# Patient Record
Sex: Male | Born: 1960 | Race: White | Hispanic: No | Marital: Married | Smoking: Never smoker
Health system: Southern US, Community
[De-identification: ages and names within clinical notes are randomized; demographics above are authoritative.]

## PROBLEM LIST (undated history)

## (undated) DIAGNOSIS — K219 Gastro-esophageal reflux disease without esophagitis: Secondary | ICD-10-CM

## (undated) DIAGNOSIS — E785 Hyperlipidemia, unspecified: Secondary | ICD-10-CM

## (undated) DIAGNOSIS — F329 Major depressive disorder, single episode, unspecified: Secondary | ICD-10-CM

## (undated) DIAGNOSIS — I10 Essential (primary) hypertension: Secondary | ICD-10-CM

## (undated) DIAGNOSIS — F32A Depression, unspecified: Secondary | ICD-10-CM

## (undated) DIAGNOSIS — M1990 Unspecified osteoarthritis, unspecified site: Secondary | ICD-10-CM

## (undated) HISTORY — PX: HERNIA REPAIR: SHX51

## (undated) HISTORY — PX: VASECTOMY: SHX75

## (undated) HISTORY — DX: Hyperlipidemia, unspecified: E78.5

## (undated) HISTORY — DX: Essential (primary) hypertension: I10

---
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
## 2013-01-27 ENCOUNTER — Encounter: Payer: Self-pay | Admitting: Emergency Medicine

## 2013-03-10 ENCOUNTER — Encounter: Payer: Self-pay | Admitting: Emergency Medicine

## 2013-03-10 DIAGNOSIS — E785 Hyperlipidemia, unspecified: Secondary | ICD-10-CM | POA: Insufficient documentation

## 2013-03-10 DIAGNOSIS — E559 Vitamin D deficiency, unspecified: Secondary | ICD-10-CM | POA: Insufficient documentation

## 2013-03-12 ENCOUNTER — Ambulatory Visit (INDEPENDENT_AMBULATORY_CARE_PROVIDER_SITE_OTHER): Payer: Commercial Managed Care - PPO | Admitting: Emergency Medicine

## 2013-03-12 ENCOUNTER — Encounter: Payer: Self-pay | Admitting: Emergency Medicine

## 2013-03-12 VITALS — BP 124/80 | HR 90 | Temp 98.0°F | Resp 18 | Ht 69.5 in | Wt 165.0 lb

## 2013-03-12 DIAGNOSIS — I10 Essential (primary) hypertension: Secondary | ICD-10-CM | POA: Insufficient documentation

## 2013-03-12 DIAGNOSIS — J329 Chronic sinusitis, unspecified: Secondary | ICD-10-CM

## 2013-03-12 DIAGNOSIS — E782 Mixed hyperlipidemia: Secondary | ICD-10-CM

## 2013-03-12 DIAGNOSIS — J309 Allergic rhinitis, unspecified: Secondary | ICD-10-CM

## 2013-03-12 DIAGNOSIS — E559 Vitamin D deficiency, unspecified: Secondary | ICD-10-CM

## 2013-03-12 DIAGNOSIS — M25569 Pain in unspecified knee: Secondary | ICD-10-CM

## 2013-03-12 LAB — CBC WITH DIFFERENTIAL/PLATELET
Basophils Absolute: 0 10*3/uL (ref 0.0–0.1)
Basophils Relative: 0 % (ref 0–1)
EOS ABS: 0.2 10*3/uL (ref 0.0–0.7)
EOS PCT: 3 % (ref 0–5)
HEMATOCRIT: 43.4 % (ref 39.0–52.0)
Hemoglobin: 14.9 g/dL (ref 13.0–17.0)
LYMPHS PCT: 25 % (ref 12–46)
Lymphs Abs: 1.9 10*3/uL (ref 0.7–4.0)
MCH: 32.5 pg (ref 26.0–34.0)
MCHC: 34.3 g/dL (ref 30.0–36.0)
MCV: 94.6 fL (ref 78.0–100.0)
MONO ABS: 0.7 10*3/uL (ref 0.1–1.0)
Monocytes Relative: 9 % (ref 3–12)
Neutro Abs: 4.9 10*3/uL (ref 1.7–7.7)
Neutrophils Relative %: 63 % (ref 43–77)
PLATELETS: 277 10*3/uL (ref 150–400)
RBC: 4.59 MIL/uL (ref 4.22–5.81)
RDW: 13.2 % (ref 11.5–15.5)
WBC: 7.6 10*3/uL (ref 4.0–10.5)

## 2013-03-12 MED ORDER — AZITHROMYCIN 250 MG PO TABS: ORAL_TABLET | ORAL | Status: AC

## 2013-03-12 MED ORDER — PREDNISONE 10 MG PO TABS: ORAL_TABLET | ORAL | Status: DC

## 2013-03-12 NOTE — Patient Instructions (Signed)
Total Knee Replacement Total knee replacement is a procedure to replace your knee joint with an artificial knee joint (prosthetic knee joint). The purpose of this surgery is to reduce pain and improve your knee function. LET YOUR CAREGIVER KNOW ABOUT:   Any allergies you have.  Any medicines you are taking, including vitamins, herbs, eyedrops, over-the-counter medicines, and creams.  Any problems you have had with the use of anesthetics.  Family history of problems with the use of anesthetics.  Any blood disorders you have, including bleeding problems or clotting problems.  Previous surgeries you have had. RISKS AND COMPLICATIONS  Generally, total knee replacement is a safe procedure. However, as with any surgical procedure, complications can occur. Possible complications associated with total knee replacement include:  Loss of range of motion of the knee or instability.  Loosening of the prosthesis.  Infection.  Persistent pain. BEFORE THE PROCEDURE   Your caregiver will instruct you when you need to stop eating and drinking.  Ask your caregiver if you need to change or stop any regular medicines. PROCEDURE  Just before the procedure you will receive medicine that will make you drowsy (sedative). This will be given through a tube that is inserted into one of your veins (intravenous [IV] tube). Then you will either receive medicine to block pain from the waist down through your legs (spinal block) or medicine to also receive medicine to make you fall asleep (general anesthetic). You may also receive medicine to block feeling in your leg (nerve block) to help ease pain after surgery. An incision will be made in your knee. Your surgeon will take out any damaged cartilage and bone by sawing off the damaged surfaces. Then the surgeon will put a new metal liner over the sawed off portion of your thigh bone (femur) and a plastic liner over the sawed off portion of one of the bones of your  lower leg (tibia). This is to restore alignment and function to your knee. A plastic piece is often used to restore the surface of your knee cap. AFTER THE PROCEDURE  You will be taken to the recovery area. You may have drainage tubes to drain excess fluid from your knee. These tubes attach to a device that removes these fluids. Once you are awake, stable, and taking fluids well, you will be taken to your hospital room. You will receive physical therapy as prescribed by your caregiver. The length of your stay in the hospital after a knee replacement is 2 4 days. Your surgeon may recommend that you spend time (usually an additional 10 14 days) in an extended-care facility to help you begin walking again and improve your range of motion before you go home. You may also be prescribed blood-thinning medicine to decrease your risk of developing blood clots in your leg. Document Released: 05/15/2000 Document Revised: 08/08/2011 Document Reviewed: 03/19/2011 [facility] Patient Information 2014 [place], [place]. Knee Effusion  Knee effusion means you have fluid in your knee. The knee may be more difficult to bend and move. HOME CARE  Use crutches or a brace as told by your doctor.  Put ice on the injured area.  Put ice in a plastic bag.  Place a towel between your skin and the bag.  Leave the ice on for 15-20 minutes, 03-04 times a day.  Raise (elevate) your knee as much as possible.  Only take medicine as told by your doctor.  You may need to do strengthening exercises. Ask your doctor.  Continue with  your normal diet and activities as told by your doctor. GET HELP RIGHT AWAY IF:  You have more puffiness (swelling) in your knee.  You see redness, puffiness, or have more pain in your knee.  You have a temperature by mouth above 102 F (38.9 C).  You get a rash.  You have trouble breathing.  You have a reaction to any medicine you are taking.  You have a lot of pain when you move your  knee. MAKE SURE YOU:  Understand these instructions.  Will watch your condition.  Will get help right away if you are not doing well or get worse. Document Released: 03/11/2010 Document Revised: 05/01/2011 Document Reviewed: 03/11/2010 [facility] Patient Information 2014 [place], [place]. Arthroscopic Procedure, Knee An arthroscopic procedure can find what is wrong with your knee. PROCEDURE Arthroscopy is a surgical technique that allows your orthopedic surgeon to diagnose and treat your knee injury with accuracy. They will look into your knee through a small instrument. This is almost like a small (pencil sized) telescope. Because arthroscopy affects your knee less than open knee surgery, you can anticipate a more rapid recovery. Taking an active role by following your caregiver's instructions will help with rapid and complete recovery. Use crutches, rest, elevation, ice, and knee exercises as instructed. The length of recovery depends on various factors including type of injury, age, physical condition, medical conditions, and your rehabilitation. Your knee is the joint between the large bones (femur and tibia) in your leg. Cartilage covers these bone ends which are smooth and slippery and allow your knee to bend and move smoothly. Two menisci, thick, semi-lunar shaped pads of cartilage which form a rim inside the joint, help absorb shock and stabilize your knee. Ligaments bind the bones together and support your knee joint. Muscles move the joint, help support your knee, and take stress off the joint itself. Because of this all programs and physical therapy to rehabilitate an injured or repaired knee require rebuilding and strengthening your muscles. AFTER THE PROCEDURE  After the procedure, you will be moved to a recovery area until most of the effects of the medication have worn off. Your caregiver will discuss the test results with you.  Only take over-the-counter or prescription medicines for  pain, discomfort, or fever as directed by your caregiver. SEEK MEDICAL CARE IF:   You have increased bleeding from your wounds.  You see redness, swelling, or have increasing pain in your wounds.  You have pus coming from your wound.  You have an oral temperature above 102 F (38.9 C).  You notice a bad smell coming from the wound or dressing.  You have severe pain with any motion of your knee. SEEK IMMEDIATE MEDICAL CARE IF:   You develop a rash.  You have difficulty breathing.  You have any allergic problems. Document Released: 02/04/2000 Document Revised: 05/01/2011 Document Reviewed: 08/28/2007 [facility] Patient Information 2014 [place], [place].

## 2013-03-12 NOTE — Progress Notes (Signed)
Subjective:    Patient ID: Jermaine Stewart, male    DOB: 01-29-1961, 52 y.o.   MRN: [id number]  HPI Comments: 53 yo pleasant male presents for 3 month F/U for HTN, Cholesterol, D. Deficient. He added vitamin D at his last visit and needs rechecked. He is trying to eat a little better and has decreased ETOH. He notes today he did have fried chicken for lunch unfortunately. He is not exercising as much with increased left knee pain. He notes he used to run QD until pain and swelling increased. He has had fluid drawn off several times, he has MRI showing arthritis build up, he is considering knee surgery and has f/u with Ortho in 2 months. He did have rooster comb injection without any relief and has been taking Meloxicam routinely without relief. LAST LABS TOTAL 281 DOWN TO 204 TRIG 873 D 25   3 WEEKS OF INCREASING DRAINAGE FROM SINUS AND NOW COLOR PRODUCTION WITH CHEST X 1 WEEK. He has tried OTC Mucinex and increased H2o without relief. He notes he does not feel bad otherwise.   Hyperlipidemia   Current Outpatient Prescriptions on File Prior to Visit  Medication Sig Dispense Refill  . aspirin 81 MG tablet Take 81 mg by mouth daily.      Marland Kitchen atorvastatin (LIPITOR) 80 MG tablet Take 80 mg by mouth daily. 1/2 mwf      . benazepril-hydrochlorthiazide (LOTENSIN HCT) 20-12.5 MG per tablet Take 1 tablet by mouth daily.      . Cholecalciferol (VITAMIN D-3) 5000 UNITS TABS Take 5,000 tablets by mouth daily. 2 PO QD      . meloxicam (MOBIC) 15 MG tablet Take 15 mg by mouth daily.       No current facility-administered medications on file prior to visit.   Review of patient's allergies indicates no known allergies.   Past Medical History  Diagnosis Date  . Hyperlipidemia   . Unspecified vitamin D deficiency   . Hypertension       Review of Systems  HENT: Positive for congestion and sinus pressure.   Respiratory: Positive for cough.   Musculoskeletal: Positive for arthralgias and joint  swelling.  All other systems reviewed and are negative.   BP 124/80  Pulse 90  Temp(Src) 98 F (36.7 C) (Temporal)  Resp 18  Ht 5' 9.5" (1.765 m)  Wt 165 lb (74.844 kg)  BMI 24.03 kg/m2     Objective:   Physical Exam  Nursing note and vitals reviewed. Constitutional: He is oriented to person, place, and time. He appears well-developed and well-nourished.  HENT:  Head: Normocephalic and atraumatic.  Right Ear: External ear normal.  Left Ear: External ear normal.  Nose: Nose normal.  Mouth/Throat: Oropharynx is clear and moist. No oropharyngeal exudate.  Yellow TMs bilateral Mild maxillary tenderness  Eyes: Conjunctivae and EOM are normal.  Neck: Normal range of motion. Neck supple. No JVD present. No thyromegaly present.  Cardiovascular: Normal rate, regular rhythm, normal heart sounds and intact distal pulses.   Pulmonary/Chest: Effort normal and breath sounds normal.  Congested cough  Abdominal: Soft. Bowel sounds are normal. He exhibits no distension and no mass. There is no tenderness. There is no rebound and no guarding.  Musculoskeletal: Normal range of motion. He exhibits edema. He exhibits no tenderness.  Left knee edema, + crepitus Bilateral  Lymphadenopathy:    He has no cervical adenopathy.  Neurological: He is alert and oriented to person, place, and time. He has normal reflexes.  No cranial nerve deficit. Coordination normal.  Skin: Skin is warm and dry.  Psychiatric: He has a normal mood and affect. His behavior is normal. Judgment and thought content normal.          Assessment & Plan:  1.  3 month F/U for HTN, Cholesterol, D. Deficient. Needs healthy diet, cardio QD and obtain healthy weight. Check Labs, Check BP if >130/80 call office 2. Knee pain- Advised to see ortho ASAP to consider surgery switch Meloxicam to Zorvolex 18 mg #3 sx given and 35mg  SX #9 try either dose 1 BID-TID call with results. Do Not start until finishes PRED 3. Sinusitis/ Allergic  rhinitis- Allegra OTC, increase H2o, allergy hygiene explained. Zpak/ Pred DP 10 MG AD

## 2013-03-13 ENCOUNTER — Encounter: Payer: Self-pay | Admitting: Emergency Medicine

## 2013-03-13 LAB — BASIC METABOLIC PANEL WITH GFR
BUN: 16 mg/dL (ref 6–23)
CALCIUM: 10.2 mg/dL (ref 8.4–10.5)
CHLORIDE: 93 meq/L — AB (ref 96–112)
CO2: 29 meq/L (ref 19–32)
CREATININE: 0.93 mg/dL (ref 0.50–1.35)
GFR, Est African American: >89 mL/min
GFR, Est Non African American: >89 mL/min
GLUCOSE: 89 mg/dL (ref 70–99)
Potassium: 4.2 mEq/L (ref 3.5–5.3)
Sodium: 135 mEq/L (ref 135–145)

## 2013-03-13 LAB — HEPATIC FUNCTION PANEL
ALT: 46 U/L (ref 0–53)
AST: 35 U/L (ref 0–37)
Albumin: 4.9 g/dL (ref 3.5–5.2)
Alkaline Phosphatase: 60 U/L (ref 39–117)
BILIRUBIN INDIRECT: 0.4 mg/dL (ref 0.0–0.9)
Bilirubin, Direct: 0.1 mg/dL (ref 0.0–0.3)
TOTAL PROTEIN: 7.3 g/dL (ref 6.0–8.3)
Total Bilirubin: 0.5 mg/dL (ref 0.3–1.2)

## 2013-03-13 LAB — LIPID PANEL
Cholesterol: 217 mg/dL — ABNORMAL HIGH (ref 0–200)
HDL: 58 mg/dL (ref >39)
TRIGLYCERIDES: 680 mg/dL — AB (ref <150)
Total CHOL/HDL Ratio: 3.7 Ratio

## 2013-03-13 LAB — VITAMIN D 25 HYDROXY (VIT D DEFICIENCY, FRACTURES): Vit D, 25-Hydroxy: 84 ng/mL (ref 30–89)

## 2013-03-13 LAB — MAGNESIUM: Magnesium: 2 mg/dL (ref 1.5–2.5)

## 2013-03-24 ENCOUNTER — Encounter: Payer: Self-pay | Admitting: Emergency Medicine

## 2013-03-25 ENCOUNTER — Other Ambulatory Visit: Payer: Self-pay | Admitting: Emergency Medicine

## 2013-03-25 MED ORDER — DICLOFENAC 35 MG PO CAPS: 35.0000 mg | ORAL_CAPSULE | Freq: Three times a day (TID) | ORAL | Status: DC

## 2013-04-07 ENCOUNTER — Other Ambulatory Visit: Payer: Self-pay | Admitting: Emergency Medicine

## 2013-04-07 MED ORDER — AZITHROMYCIN 250 MG PO TABS
ORAL_TABLET | ORAL | Status: AC
Start: 2013-04-07 — End: 2013-04-12

## 2013-06-08 ENCOUNTER — Other Ambulatory Visit: Payer: Self-pay | Admitting: Emergency Medicine

## 2013-06-08 ENCOUNTER — Encounter: Payer: Self-pay | Admitting: Internal Medicine

## 2013-06-08 MED ORDER — AZELASTINE HCL 0.15 % NA SOLN: NASAL | Status: DC

## 2013-06-16 ENCOUNTER — Encounter: Payer: Self-pay | Admitting: Internal Medicine

## 2013-07-17 ENCOUNTER — Other Ambulatory Visit: Payer: Self-pay | Admitting: Internal Medicine

## 2013-07-22 ENCOUNTER — Ambulatory Visit (INDEPENDENT_AMBULATORY_CARE_PROVIDER_SITE_OTHER): Payer: Commercial Managed Care - PPO | Admitting: Internal Medicine

## 2013-07-22 ENCOUNTER — Encounter: Payer: Self-pay | Admitting: Internal Medicine

## 2013-07-22 VITALS — BP 116/74 | HR 80 | Temp 99.3°F | Resp 16 | Ht 69.5 in | Wt 165.8 lb

## 2013-07-22 DIAGNOSIS — Z113 Encounter for screening for infections with a predominantly sexual mode of transmission: Secondary | ICD-10-CM

## 2013-07-22 DIAGNOSIS — E559 Vitamin D deficiency, unspecified: Secondary | ICD-10-CM

## 2013-07-22 DIAGNOSIS — R7402 Elevation of levels of lactic acid dehydrogenase (LDH): Secondary | ICD-10-CM

## 2013-07-22 DIAGNOSIS — Z111 Encounter for screening for respiratory tuberculosis: Secondary | ICD-10-CM

## 2013-07-22 DIAGNOSIS — I10 Essential (primary) hypertension: Secondary | ICD-10-CM

## 2013-07-22 DIAGNOSIS — R7309 Other abnormal glucose: Secondary | ICD-10-CM | POA: Insufficient documentation

## 2013-07-22 DIAGNOSIS — Z1211 Encounter for screening for malignant neoplasm of colon: Secondary | ICD-10-CM

## 2013-07-22 DIAGNOSIS — R7401 Elevation of levels of liver transaminase levels: Secondary | ICD-10-CM

## 2013-07-22 DIAGNOSIS — R740 Nonspecific elevation of levels of transaminase and lactic acid dehydrogenase [LDH]: Secondary | ICD-10-CM

## 2013-07-22 DIAGNOSIS — Z125 Encounter for screening for malignant neoplasm of prostate: Secondary | ICD-10-CM

## 2013-07-22 DIAGNOSIS — Z79899 Other long term (current) drug therapy: Secondary | ICD-10-CM | POA: Insufficient documentation

## 2013-07-22 DIAGNOSIS — Z23 Encounter for immunization: Secondary | ICD-10-CM

## 2013-07-22 DIAGNOSIS — R634 Abnormal weight loss: Secondary | ICD-10-CM

## 2013-07-22 DIAGNOSIS — Z0000 Encounter for general adult medical examination without abnormal findings: Secondary | ICD-10-CM

## 2013-07-22 DIAGNOSIS — Z1212 Encounter for screening for malignant neoplasm of rectum: Secondary | ICD-10-CM

## 2013-07-22 DIAGNOSIS — R197 Diarrhea, unspecified: Secondary | ICD-10-CM

## 2013-07-22 LAB — CBC WITH DIFFERENTIAL/PLATELET
Basophils Absolute: 0 10*3/uL (ref 0.0–0.1)
Basophils Relative: 0 % (ref 0–1)
Eosinophils Absolute: 0.1 10*3/uL (ref 0.0–0.7)
Eosinophils Relative: 3 % (ref 0–5)
HEMATOCRIT: 36.8 % — AB (ref 39.0–52.0)
HEMOGLOBIN: 13 g/dL (ref 13.0–17.0)
Lymphocytes Relative: 31 % (ref 12–46)
Lymphs Abs: 0.9 10*3/uL (ref 0.7–4.0)
MCH: 32 pg (ref 26.0–34.0)
MCHC: 35.3 g/dL (ref 30.0–36.0)
MCV: 90.6 fL (ref 78.0–100.0)
MONO ABS: 0.5 10*3/uL (ref 0.1–1.0)
MONOS PCT: 18 % — AB (ref 3–12)
NEUTROS ABS: 1.3 10*3/uL — AB (ref 1.7–7.7)
Neutrophils Relative %: 48 % (ref 43–77)
Platelets: 224 10*3/uL (ref 150–400)
RBC: 4.06 MIL/uL — ABNORMAL LOW (ref 4.22–5.81)
RDW: 13.4 % (ref 11.5–15.5)
WBC: 2.8 10*3/uL — ABNORMAL LOW (ref 4.0–10.5)

## 2013-07-22 MED ORDER — DICLOFENAC SODIUM ER 100 MG PO TB24: 100.0000 mg | ORAL_TABLET | Freq: Every day | ORAL | Status: DC

## 2013-07-22 NOTE — Patient Instructions (Addendum)
Wear and Tear Disorders of the Knee (Arthritis, Osteoarthritis) Everyone will experience wear and tear injuries (arthritis, osteoarthritis) of the knee. These are the changes we all get as we age. They come from the joint stress of daily living. The amount of cartilage damage in your knee and your symptoms determine if you need surgery. Mild problems require approximately two months recovery time. More severe problems take several months to recover. With mild problems, your surgeon may find worn and rough cartilage surfaces. With severe changes, your surgeon may find cartilage that has completely worn away and exposed the bone. Loose bodies of bone and cartilage, bone spurs (excess bone growth), and injuries to the menisci (cushions between the large bones of your leg) are also common. All of these problems can cause pain. For a mild wear and tear problem, rough cartilage may simply need to be shaved and smoothed. For more severe problems with areas of exposed bone, your surgeon may use an instrument for roughing up the bone surfaces to stimulate new cartilage growth. Loose bodies are usually removed. Torn menisci may be trimmed or repaired. ABOUT THE ARTHROSCOPIC PROCEDURE Arthroscopy is a surgical technique. It allows your orthopedic surgeon to diagnose and treat your knee injury with accuracy. The surgeon looks into your knee through a small scope. The scope is like a small (pencil-sized) telescope. Arthroscopy is less invasive than open knee surgery. You can expect a more rapid recovery. After the procedure, you will be moved to a recovery area until most of the effects of the medication have worn off. Your caregiver will discuss the test results with you. RECOVERY The severity of the arthritis and the type of procedure performed will determine recovery time. Other important factors include age, physical condition, medical conditions, and the type of rehabilitation program. Strengthening your muscles  after arthroscopy helps guarantee a better recovery. Follow your caregiver's instructions. Use crutches, rest, elevate, ice, and do knee exercises as instructed. Your caregivers will help you and instruct you with exercises and other physical therapy required to regain your mobility, muscle strength, and functioning following surgery. Only take over-the-counter or prescription medicines for pain, discomfort, or fever as directed by your caregiver.  SEEK MEDICAL CARE IF:   There is increased bleeding (more than a small spot) from the wound.  You notice redness, swelling, or increasing pain in the wound.  Pus is coming from wound.  You develop an unexplained oral temperature above 102 F (38.9 C) , or as your caregiver suggests.  You notice a foul smell coming from the wound or dressing.  You have severe pain with motion of the knee. SEEK IMMEDIATE MEDICAL CARE IF:   You develop a rash.  You have difficulty breathing.  You have any allergic problems. MAKE SURE YOU:   Understand these instructions.  Will watch your condition.  Will get help right away if you are not doing well or get worse. Document Released: 02/04/2000 Document Revised: 05/01/2011 Document Reviewed: 07/03/2007 [facility] Patient Information 2014 [place], [place].    Osteoarthritis Osteoarthritis is a disease that causes soreness and swelling (inflammation) of a joint. It occurs when the cartilage at the affected joint wears down. Cartilage acts as a cushion, covering the ends of bones where they meet to form a joint. Osteoarthritis is the most common form of arthritis. It often occurs in older people. The joints affected most often by this condition include those in the:  Ends of the fingers.  Thumbs.  Neck.  Lower  back.  Knees.  Hips. CAUSES  Over time, the cartilage that covers the ends of bones begins to wear away. This causes bone to rub on bone, producing pain and stiffness in the affected joints.   RISK FACTORS Certain factors can increase your chances of having osteoarthritis, including:  Older age.  Excessive body weight.  Overuse of joints. SIGNS AND SYMPTOMS   Pain, swelling, and stiffness in the joint.  Over time, the joint may lose its normal shape.  Small deposits of bone (osteophytes) may grow on the edges of the joint.  Bits of bone or cartilage can break off and float inside the joint space. This may cause more pain and damage. DIAGNOSIS  Your health care provider will do a physical exam and ask about your symptoms. Various tests may be ordered, such as:  X-rays of the affected joint.  An MRI scan.  Blood tests to rule out other types of arthritis.  Joint fluid tests. This involves using a needle to draw fluid from the joint and examining the fluid under a microscope. TREATMENT  Goals of treatment are to control pain and improve joint function. Treatment plans may include:  A prescribed exercise program that allows for rest and joint relief.  A weight control plan.  Pain relief techniques, such as:  Properly applied heat and cold.  Electric pulses delivered to nerve endings under the skin (transcutaneous electrical nerve stimulation, TENS).  Massage.  Certain nutritional supplements.  Medicines to control pain, such as:  Acetaminophen.  Nonsteroidal anti-inflammatory drugs (NSAIDs), such as naproxen.  Narcotic or central-acting agents, such as tramadol.  Corticosteroids. These can be given orally or as an injection.  Surgery to reposition the bones and relieve pain (osteotomy) or to remove loose pieces of bone and cartilage. Joint replacement may be needed in advanced states of osteoarthritis. HOME CARE INSTRUCTIONS   Only take over-the-counter or prescription medicines as directed by your health care provider. Take all medicines exactly as instructed.  Maintain a healthy weight. Follow your health care provider's instructions for weight  control. This may include dietary instructions.  Exercise as directed. Your health care provider can recommend specific types of exercise. These may include:  Strengthening exercises These are done to strengthen the muscles that support joints affected by arthritis. They can be performed with weights or with exercise bands to add resistance.  Aerobic activities These are exercises, such as brisk walking or low-impact aerobics, that get your heart pumping.  Range-of-motion activities These keep your joints limber.  Balance and agility exercises These help you maintain daily living skills.  Rest your affected joints as directed by your health care provider.  Follow up with your health care provider as directed. SEEK MEDICAL CARE IF:   Your skin turns red.  You develop a rash in addition to your joint pain.  You have worsening joint pain. SEEK IMMEDIATE MEDICAL CARE IF:  You have a significant loss of weight or appetite.  You have a fever along with joint or muscle aches.  You have night sweats. FOR MORE INFORMATION  National Institute of Arthritis and Musculoskeletal and Skin Diseases: www.niams.http://www.myers.net/ [employer] on Aging: https://walker.com/ American College of Rheumatology: www.rheumatology.org Document Released: 02/06/2005 Document Revised: 11/27/2012 Document Reviewed: 10/14/2012 [facility] Patient Information 2014 [place], [place].  Hypertension As your heart beats, it forces blood through your arteries. This force is your blood pressure. If the pressure is too high, it is called hypertension (HTN) or high blood pressure. HTN is dangerous because  you may have it and not know it. High blood pressure may mean that your heart has to work harder to pump blood. Your arteries may be narrow or stiff. The extra work puts you at risk for heart disease, stroke, and other problems.  Blood pressure consists of two numbers, a higher number over a lower, 110/72, for example. It is stated  as "110 over 72." The ideal is below 120 for the top number (systolic) and under 80 for the bottom (diastolic). Write down your blood pressure today. You should pay close attention to your blood pressure if you have certain conditions such as:  Heart failure.  Prior heart attack.  Diabetes  Chronic kidney disease.  Prior stroke.  Multiple risk factors for heart disease. To see if you have HTN, your blood pressure should be measured while you are seated with your arm held at the level of the heart. It should be measured at least twice. A one-time elevated blood pressure reading (especially in the Emergency Department) does not mean that you need treatment. There may be conditions in which the blood pressure is different between your right and left arms. It is important to see your caregiver soon for a recheck. Most people have essential hypertension which means that there is not a specific cause. This type of high blood pressure may be lowered by changing lifestyle factors such as:  Stress.  Smoking.  Lack of exercise.  Excessive weight.  Drug/tobacco/alcohol use.  Eating less salt. Most people do not have symptoms from high blood pressure until it has caused damage to the body. Effective treatment can often prevent, delay or reduce that damage. TREATMENT  When a cause has been identified, treatment for high blood pressure is directed at the cause. There are a large number of medications to treat HTN. These fall into several categories, and your caregiver will help you select the medicines that are best for you. Medications may have side effects. You should review side effects with your caregiver. If your blood pressure stays high after you have made lifestyle changes or started on medicines,   Your medication(s) may need to be changed.  Other problems may need to be addressed.  Be certain you understand your prescriptions, and know how and when to take your medicine.  Be sure  to follow up with your caregiver within the time frame advised (usually within two weeks) to have your blood pressure rechecked and to review your medications.  If you are taking more than one medicine to lower your blood pressure, make sure you know how and at what times they should be taken. Taking two medicines at the same time can result in blood pressure that is too low. SEEK IMMEDIATE MEDICAL CARE IF:  You develop a severe headache, blurred or changing vision, or confusion.  You have unusual weakness or numbness, or a faint feeling.  You have severe chest or abdominal pain, vomiting, or breathing problems. MAKE SURE YOU:   Understand these instructions.  Will watch your condition.  Will get help right away if you are not doing well or get worse.   Diabetes and Exercise Exercising regularly is important. It is not just about losing weight. It has many health benefits, such as:  Improving your overall fitness, flexibility, and endurance.  Increasing your bone density.  Helping with weight control.  Decreasing your body fat.  Increasing your muscle strength.  Reducing stress and tension.  Improving your overall health. People with diabetes who exercise  gain additional benefits because exercise:  Reduces appetite.  Improves the body's use of blood sugar (glucose).  Helps lower or control blood glucose.  Decreases blood pressure.  Helps control blood lipids (such as cholesterol and triglycerides).  Improves the body's use of the hormone insulin by:  Increasing the body's insulin sensitivity.  Reducing the body's insulin needs.  Decreases the risk for heart disease because exercising:  Lowers cholesterol and triglycerides levels.  Increases the levels of good cholesterol (such as high-density lipoproteins [HDL]) in the body.  Lowers blood glucose levels. YOUR ACTIVITY PLAN  Choose an activity that you enjoy and set realistic goals. Your health care  provider or diabetes educator can help you make an activity plan that works for you. You can break activities into 2 or 3 sessions throughout the day. Doing so is as good as one long session. Exercise ideas include:  Taking the dog for a walk.  Taking the stairs instead of the elevator.  Dancing to your favorite song.  Doing your favorite exercise with a friend. RECOMMENDATIONS FOR EXERCISING WITH TYPE 1 OR TYPE 2 DIABETES   Check your blood glucose before exercising. If blood glucose levels are greater than 240 mg/dL, check for urine ketones. Do not exercise if ketones are present.  Avoid injecting insulin into areas of the body that are going to be exercised. For example, avoid injecting insulin into:  The arms when playing tennis.  The legs when jogging.  Keep a record of:  Food intake before and after you exercise.  Expected peak times of insulin action.  Blood glucose levels before and after you exercise.  The type and amount of exercise you have done.  Review your records with your health care provider. Your health care provider will help you to develop guidelines for adjusting food intake and insulin amounts before and after exercising.  If you take insulin or oral hypoglycemic agents, watch for signs and symptoms of hypoglycemia. They include:  Dizziness.  Shaking.  Sweating.  Chills.  Confusion.  Drink plenty of water while you exercise to prevent dehydration or heat stroke. Body water is lost during exercise and must be replaced.  Talk to your health care provider before starting an exercise program to make sure it is safe for you. Remember, almost any type of activity is better than none.    Cholesterol Cholesterol is a white, waxy, fat-like protein needed by your body in small amounts. The liver makes all the cholesterol you need. It is carried from the liver by the blood through the blood vessels. Deposits (plaque) may build up on blood vessel walls.  This makes the arteries narrower and stiffer. Plaque increases the risk for heart attack and stroke. You cannot feel your cholesterol level even if it is very high. The only way to know is by a blood test to check your lipid (fats) levels. Once you know your cholesterol levels, you should keep a record of the test results. Work with your caregiver to to keep your levels in the desired range. WHAT THE RESULTS MEAN:  Total cholesterol is a rough measure of all the cholesterol in your blood.  LDL is the so-called bad cholesterol. This is the type that deposits cholesterol in the walls of the arteries. You want this level to be low.  HDL is the good cholesterol because it cleans the arteries and carries the LDL away. You want this level to be high.  Triglycerides are fat that the body can either  burn for energy or store. High levels are closely linked to heart disease. DESIRED LEVELS:  Total cholesterol below 200.  LDL below 100 for people at risk, below 70 for very high risk.  HDL above 50 is good, above 60 is best.  Triglycerides below 150. HOW TO LOWER YOUR CHOLESTEROL:  Diet.  Choose fish or white meat chicken and [place], roasted or baked. Limit fatty cuts of red meat, fried foods, and processed meats, such as sausage and lunch meat.  Eat lots of fresh fruits and vegetables. Choose whole grains, beans, pasta, potatoes and cereals.  Use only small amounts of olive, corn or canola oils. Avoid butter, mayonnaise, shortening or palm kernel oils. Avoid foods with trans-fats.  Use skim/nonfat milk and low-fat/nonfat yogurt and cheeses. Avoid whole milk, cream, ice cream, egg yolks and cheeses. Healthy desserts include angel food cake, ginger snaps, animal crackers, hard candy, popsicles, and low-fat/nonfat frozen yogurt. Avoid pastries, cakes, pies and cookies.  Exercise.  A regular program helps decrease LDL and raises HDL.  Helps with weight control.  Do things that increase your  activity level like gardening, walking, or taking the stairs.  Medication.  May be prescribed by your caregiver to help lowering cholesterol and the risk for heart disease.  You may need medicine even if your levels are normal if you have several risk factors. HOME CARE INSTRUCTIONS   Follow your diet and exercise programs as suggested by your caregiver.  Take medications as directed.  Have blood work done when your caregiver feels it is necessary. MAKE SURE YOU:   Understand these instructions.  Will watch your condition.  Will get help right away if you are not doing well or get worse.      Vitamin D Deficiency Vitamin D is an important vitamin that your body needs. Having too little of it in your body is called a deficiency. A very bad deficiency can make your bones soft and can cause a condition called rickets.  Vitamin D is important to your body for different reasons, such as:   It helps your body absorb 2 minerals called calcium and phosphorus.  It helps make your bones healthy.  It may prevent some diseases, such as diabetes and multiple sclerosis.  It helps your muscles and heart. You can get vitamin D in several ways. It is a natural part of some foods. The vitamin is also added to some dairy products and cereals. Some people take vitamin D supplements. Also, your body makes vitamin D when you are in the sun. It changes the sun's rays into a form of the vitamin that your body can use. CAUSES   Not eating enough foods that contain vitamin D.  Not getting enough sunlight.  Having certain digestive system diseases that make it hard to absorb vitamin D. These diseases include Crohn's disease, chronic pancreatitis, and cystic fibrosis.  Having a surgery in which part of the stomach or small intestine is removed.  Being obese. Fat cells pull vitamin D out of your blood. That means that obese people may not have enough vitamin D left in their blood and in other body  tissues.  Having chronic kidney or liver disease. RISK FACTORS Risk factors are things that make you more likely to develop a vitamin D deficiency. They include:  Being older.  Not being able to get outside very much.  Living in a nursing home.  Having had broken bones.  Having weak or thin bones (osteoporosis).  Having  a disease or condition that changes how your body absorbs vitamin D.  Having dark skin.  Some medicines such as seizure medicines or steroids.  Being overweight or obese. SYMPTOMS Mild cases of vitamin D deficiency may not have any symptoms. If you have a very bad case, symptoms may include:  Bone pain.  Muscle pain.  Falling often.  Broken bones caused by a minor injury, due to osteoporosis. DIAGNOSIS A blood test is the best way to tell if you have a vitamin D deficiency. TREATMENT Vitamin D deficiency can be treated in different ways. Treatment for vitamin D deficiency depends on what is causing it. Options include:  Taking vitamin D supplements.  Taking a calcium supplement. Your caregiver will suggest what dose is best for you. HOME CARE INSTRUCTIONS  Take any supplements that your caregiver prescribes. Follow the directions carefully. Take only the suggested amount.  Have your blood tested 2 months after you start taking supplements.  Eat foods that contain vitamin D. Healthy choices include:  Fortified dairy products, cereals, or juices. Fortified means vitamin D has been added to the food. Check the label on the package to be sure.  Fatty fish like salmon or trout.  Eggs.  Oysters.  Do not use a tanning bed.  Keep your weight at a healthy level. Lose weight if you need to.  Keep all follow-up appointments. Your caregiver will need to perform blood tests to make sure your vitamin D deficiency is going away. SEEK MEDICAL CARE IF:  You have any questions about your treatment.  You continue to have symptoms of vitamin D  deficiency.  You have nausea or vomiting.  You are constipated.  You feel confused.  You have severe abdominal or back pain. MAKE SURE YOU:  Understand these instructions.  Will watch your condition.  Will get help right away if you are not doing well or get worse.

## 2013-07-22 NOTE — Progress Notes (Signed)
Patient ID: Jermaine RosenthalJeffrey Marano, male   DOB: 1960/10/04, 53 y.o.   MRN: [id number]  Annual Screening Comprehensive Examination  This very nice 53 y.o.MWM presents for complete physical.  Patient has been followed for HTN,  Hyperlipidemia, and Vitamin D Deficiency.   HTN predates since 2000 . Patient's BP has been controlled at home running in the 120/70-80 range.Today's BP: 116/74 mmHg. Patient denies any cardiac symptoms as chest pain, palpitations, shortness of breath, dizziness or ankle swelling.   Patient's hyperlipidemia is fullycontrolled with diet and medications. Patient denies myalgias or other medication SE's. Last cholesterol last visit was 217,  Elevated triglycerides 680, HDL  58 and LDL not calc.     Patient is screened  For Prediabetes & insulin resistance  With negative screening and last A1c was 5.3% in Apr 2014. Patient denies reactive hypoglycemic symptoms, visual blurring, diabetic polys or paresthesias.    Finally, patient has history of Vitamin D Deficiency of  25 in Apr 2014 and last vitamin D 68 in Aug.  Medication Sig  . atorvastatin (LIPITOR) 80 MG tablet Take 80 mg by mouth daily. 1/2 mwf  . VITAMIN D 5000 UNITS Take 5,000 tablets by mouth daily. 2-3 PO QD  . lisinopril-hctz 20-25 MG per tablet TAKE 1 TABLET DAILY FOR BLOOD PRESSURE   No Known Allergies  Past Medical History  Diagnosis Date  . Hyperlipidemia   . Unspecified vitamin D deficiency   . Hypertension    Past Surgical History  Procedure Laterality Date  . Hernia repair     Family History  Problem Relation Age of Onset  . Cancer Mother     BREAST  . Hypertension Brother   . Stroke Brother   . Hypertension Brother   . Hypertension Brother    History   Social History  . Marital Status: Unknown    Spouse Name: Cala BradfordKimberly     Number of Children: 2 daughters    Occupational History  . [employer] comptroller   Social History Main Topics  . Smoking status: Never Smoker   . Smokeless  tobacco: Not on file  . Alcohol Use: 1.2 oz/week    2 Cans of beer per week  . Drug Use: No  . Sexual Activity: Yes    ROS Constitutional: Denies fever, chills, weight loss/gain, headaches, insomnia, fatigue, night sweats or change in appetite. Eyes: Denies redness, blurred vision, diplopia, discharge, itchy or watery eyes.  ENT: Denies discharge, congestion, post nasal drip, epistaxis, sore throat, earache, hearing loss, dental pain, Tinnitus, Vertigo, Sinus pain or snoring.  Cardio: Denies chest pain, palpitations, irregular heartbeat, syncope, dyspnea, diaphoresis, orthopnea, PND, claudication or edema Respiratory: denies cough, dyspnea, DOE, pleurisy, hoarseness, laryngitis or wheezing.  Gastrointestinal: Denies dysphagia, heartburn, reflux, water brash, pain, cramps, nausea, vomiting, bloating,  constipation, hematemesis, melena, hematochezia, jaundice or hemorrhoids. Having sporadic diarrhea. Genitourinary: Denies dysuria, frequency, urgency, nocturia, hesitancy, discharge, hematuria or flank pain Musculoskeletal: C/o Bilat Knee pains currently undergoing injections. Skin: Denies puritis, rash, hives, warts, acne, eczema or change in skin lesion Neuro: No weakness, tremor, incoordination, spasms, paresthesia or pain Psychiatric: Denies confusion, memory loss or sensory loss Endocrine: Denies change in weight, skin, hair change, nocturia, and paresthesia, diabetic polys, visual blurring or hyper / hypo glycemic episodes.  Heme/Lymph: No excessive bleeding, bruising or enlarged lymph nodes.  Physical Exam  BP 116/74  Pulse 80  Temp(Src) 99.3 F (37.4 C) (Temporal)  Resp 16  Ht 5' 9.5" (1.765 m)  Wt 165 lb 12.8 oz (  75.206 kg)  BMI 24.14 kg/m2  General Appearance: Well nourished, in no apparent distress. Eyes: PERRLA, EOMs, conjunctiva no swelling or erythema, normal fundi and vessels. Sinuses: No frontal/maxillary tenderness ENT/Mouth: EACs patent / TMs  nl. Nares clear without  erythema, swelling, mucoid exudates. Oral hygiene is good. No erythema, swelling, or exudate. Tongue normal, non-obstructing. Tonsils not swollen or erythematous. Hearing normal.  Neck: Supple, thyroid normal. No bruits, nodes or JVD. Respiratory: Respiratory effort normal.  BS equal and clear bilateral without rales, rhonci, wheezing or stridor. Cardio: Heart sounds are normal with regular rate and rhythm and no murmurs, rubs or gallops. Peripheral pulses are normal and equal bilaterally without edema. No aortic or femoral bruits. Chest: symmetric with normal excursions and percussion.  Abdomen: Flat, soft, with bowl sounds. Nontender, no guarding, rebound, hernias, masses, or organomegaly.  Lymphatics: Non tender without lymphadenopathy.  Genitourinary: No hernias.Testes nl. DRE - prostate nl for age - smooth & firm w/o nodules. Musculoskeletal: Full ROM all peripheral extremities and normal gait. Has Bilat Knee crepitus w/o effusions evident. Skin: Warm and dry without rashes, lesions, cyanosis, clubbing or  ecchymosis.  Neuro: Cranial nerves intact, reflexes equal bilaterally. Normal muscle tone, no cerebellar symptoms. Sensation intact.  Pysch: Awake and oriented X 3, normal affect, insight and judgment appropriate.   Assessment and Plan  1. Annual Screening Examination 2. Hypertension  3. Hyperlipidemia (trigs) 4. Pre Diabetes - screening 5. Vitamin D Deficiency 6. DJD, Knees  Continue prudent diet as discussed, weight control, BP monitoring, regular exercise, and medications as discussed.  Discussed med effects and SE's. Routine screening labs and tests as requested with regular follow-up as recommended.

## 2013-07-23 ENCOUNTER — Encounter: Payer: Self-pay | Admitting: Internal Medicine

## 2013-07-23 LAB — BASIC METABOLIC PANEL WITH GFR
BUN: 9 mg/dL (ref 6–23)
CO2: 26 mEq/L (ref 19–32)
Calcium: 9.4 mg/dL (ref 8.4–10.5)
Chloride: 93 mEq/L — ABNORMAL LOW (ref 96–112)
Creat: 0.83 mg/dL (ref 0.50–1.35)
GFR, Est Non African American: >89 mL/min
GLUCOSE: 85 mg/dL (ref 70–99)
POTASSIUM: 4.1 meq/L (ref 3.5–5.3)
Sodium: 135 mEq/L (ref 135–145)

## 2013-07-23 LAB — PSA: PSA: 0.61 ng/mL (ref <=4.00)

## 2013-07-23 LAB — HEPATIC FUNCTION PANEL
ALT: 79 U/L — AB (ref 0–53)
AST: 62 U/L — AB (ref 0–37)
Albumin: 4.3 g/dL (ref 3.5–5.2)
Alkaline Phosphatase: 55 U/L (ref 39–117)
BILIRUBIN INDIRECT: 0.5 mg/dL (ref 0.2–1.2)
Bilirubin, Direct: 0.1 mg/dL (ref 0.0–0.3)
Total Bilirubin: 0.6 mg/dL (ref 0.2–1.2)
Total Protein: 6.6 g/dL (ref 6.0–8.3)

## 2013-07-23 LAB — MICROALBUMIN / CREATININE URINE RATIO
Creatinine, Urine: 26.2 mg/dL
MICROALB/CREAT RATIO: 19.1 mg/g (ref 0.0–30.0)
Microalb, Ur: 0.5 mg/dL (ref 0.00–1.89)

## 2013-07-23 LAB — MAGNESIUM: Magnesium: 1.8 mg/dL (ref 1.5–2.5)

## 2013-07-23 LAB — VITAMIN B12: Vitamin B-12: 652 pg/mL (ref 211–911)

## 2013-07-23 LAB — TSH: TSH: 1.083 u[IU]/mL (ref 0.350–4.500)

## 2013-07-23 LAB — HEPATITIS C ANTIBODY: HCV Ab: NEGATIVE

## 2013-07-23 LAB — RPR

## 2013-07-23 LAB — TESTOSTERONE: TESTOSTERONE: 316 ng/dL (ref 300–890)

## 2013-07-23 LAB — LIPID PANEL
CHOLESTEROL: 211 mg/dL — AB (ref 0–200)
HDL: 49 mg/dL (ref >39)
LDL CALC: 118 mg/dL — AB (ref 0–99)
Total CHOL/HDL Ratio: 4.3 Ratio
Triglycerides: 218 mg/dL — ABNORMAL HIGH (ref <150)
VLDL: 44 mg/dL — AB (ref 0–40)

## 2013-07-23 LAB — HIV ANTIBODY (ROUTINE TESTING W REFLEX): HIV 1&2 Ab, 4th Generation: NONREACTIVE

## 2013-07-23 LAB — HEPATITIS B SURFACE ANTIBODY,QUALITATIVE: Hep B S Ab: NEGATIVE

## 2013-07-23 LAB — GLIA (IGA/G) + TTG IGA
Gliadin IgA: 8 U/mL (ref <20)
Gliadin IgG: 13.3 U/mL (ref <20)
Tissue Transglutaminase Ab, IgA: 2.4 U/mL (ref <20)

## 2013-07-23 LAB — INSULIN, FASTING: Insulin fasting, serum: 8 u[IU]/mL (ref 3–28)

## 2013-07-23 LAB — HEMOGLOBIN A1C
HEMOGLOBIN A1C: 5.3 % (ref <5.7)
MEAN PLASMA GLUCOSE: 105 mg/dL (ref <117)

## 2013-07-23 LAB — HEPATITIS A ANTIBODY, TOTAL: Hep A Total Ab: NONREACTIVE

## 2013-07-23 LAB — HEPATITIS B CORE ANTIBODY, TOTAL: Hep B Core Total Ab: NONREACTIVE

## 2013-07-23 LAB — VITAMIN D 25 HYDROXY (VIT D DEFICIENCY, FRACTURES): Vit D, 25-Hydroxy: 65 ng/mL (ref 30–89)

## 2013-07-24 LAB — HEPATITIS B E ANTIBODY: Hepatitis Be Antibody: NONREACTIVE

## 2013-07-25 LAB — TB SKIN TEST
Induration: 0 mm
TB Skin Test: NEGATIVE

## 2013-08-01 ENCOUNTER — Other Ambulatory Visit: Payer: Self-pay | Admitting: Internal Medicine

## 2013-08-01 MED ORDER — DICLOFENAC SODIUM ER 100 MG PO TB24: 100.0000 mg | ORAL_TABLET | Freq: Every day | ORAL | Status: DC

## 2013-09-08 ENCOUNTER — Encounter: Payer: Self-pay | Admitting: Internal Medicine

## 2013-09-16 ENCOUNTER — Encounter: Payer: Self-pay | Admitting: Internal Medicine

## 2013-09-16 ENCOUNTER — Other Ambulatory Visit: Payer: Self-pay | Admitting: Internal Medicine

## 2013-09-16 MED ORDER — ALPRAZOLAM ER 1 MG PO TB24: ORAL_TABLET | ORAL | Status: DC

## 2013-09-16 MED ORDER — CITALOPRAM HYDROBROMIDE 40 MG PO TABS: ORAL_TABLET | ORAL | Status: DC

## 2013-10-16 ENCOUNTER — Other Ambulatory Visit: Payer: Self-pay | Admitting: Internal Medicine

## 2013-10-16 MED ORDER — PREDNISONE 20 MG PO TABS: ORAL_TABLET | ORAL | Status: DC

## 2013-10-29 ENCOUNTER — Ambulatory Visit: Payer: Self-pay | Admitting: Physician Assistant

## 2013-10-30 ENCOUNTER — Other Ambulatory Visit: Payer: Self-pay | Admitting: Orthopedic Surgery

## 2013-11-12 ENCOUNTER — Other Ambulatory Visit: Payer: Self-pay | Admitting: Emergency Medicine

## 2013-12-03 ENCOUNTER — Encounter (HOSPITAL_COMMUNITY): Payer: Self-pay | Admitting: Pharmacy Technician

## 2013-12-05 ENCOUNTER — Encounter (HOSPITAL_COMMUNITY): Payer: Self-pay

## 2013-12-05 NOTE — Pre-Procedure Instructions (Addendum)
Jermaine RosenthalJeffrey Stewart  12/05/2013   Your procedure is scheduled on:  Wednesday. October 28.  [facility] at 10:45 AM.  Call this number if you have problems the morning of surgery: [phone]   Remember:   Do not eat food or drink liquids after midnight Tuesday, October 27.   Take these medicines the morning of surgery with A SIP OF WATER: None.                Stop taking Aspirin, Vitamins and Diclofenac Tuesday, October 20.   Do not wear jewelry, make-up or nail polish.  Do not wear lotions, powders, or perfumes.   Men may shave face and neck.  Do not bring valuables to the hospital.               [facility] is not responsible for any belongings or valuables.               Contacts, dentures or bridgework may not be worn into surgery.  Leave suitcase in the car. After surgery it may be brought to your room.  For patients admitted to the hospital, discharge time is determined by your treatment team.                Special Instructions: [facility] - Preparing for Surgery  Before surgery, you can play an important role.  Because skin is not sterile, your skin needs to be as free of germs as possible.  You can reduce the number of germs on you skin by washing with CHG (chlorahexidine gluconate) soap before surgery.  CHG is an antiseptic cleaner which kills germs and bonds with the skin to continue killing germs even after washing.  Please DO NOT use if you have an allergy to CHG or antibacterial soaps.  If your skin becomes reddened/irritated stop using the CHG and inform your nurse when you arrive at Short Stay.  Do not shave (including legs and underarms) for at least 48 hours prior to the first CHG shower.  You may shave your face.  Please follow these instructions carefully:   1.  Shower with CHG Soap the night before surgery and the                                morning of Surgery.  2.  If you choose to wash your hair, wash your hair first as usual  with your       normal shampoo.  3.  After you shampoo, rinse your hair and body thoroughly to remove the                      Shampoo.  4.  Use CHG as you would any other liquid soap.  You can apply chg directly       to the skin and wash gently with scrungie or a clean washcloth.  5.  Apply the CHG Soap to your body ONLY FROM THE NECK DOWN.        Do not use on open wounds or open sores.  Avoid contact with your eyes,       ears, mouth and genitals (private parts).  Wash genitals (private parts)       with your normal soap.  6.  Wash thoroughly, paying special attention to the area where your surgery        will be performed.  7.  Thoroughly rinse your body with warm water from the neck down.  8.  DO NOT shower/wash with your normal soap after using and rinsing off       the CHG Soap.  9.  Pat yourself dry with a clean towel.            10.  Wear clean pajamas.            11.  Place clean sheets on your bed the night of your first shower and do not        sleep with pets.  Day of Surgery  Do not apply any lotions/deoderants the morning of surgery.  Please wear clean clothes to the hospital/surgery center.      Please read over the following fact sheets that you were given: Pain Booklet, Coughing and Deep Breathing, Blood Transfusion Information and Surgical Site Infection Prevention

## 2013-12-08 ENCOUNTER — Encounter (HOSPITAL_COMMUNITY): Payer: Self-pay

## 2013-12-08 ENCOUNTER — Encounter (HOSPITAL_COMMUNITY)
Admission: RE | Admit: 2013-12-08 | Discharge: 2013-12-08 | Disposition: A | Discharge status: Home / Self Care | Payer: Commercial Managed Care - PPO | Source: Ambulatory Visit | Attending: Orthopedic Surgery | Admitting: Orthopedic Surgery

## 2013-12-08 DIAGNOSIS — I10 Essential (primary) hypertension: Secondary | ICD-10-CM | POA: Diagnosis not present

## 2013-12-08 DIAGNOSIS — M129 Arthropathy, unspecified: Secondary | ICD-10-CM | POA: Diagnosis not present

## 2013-12-08 DIAGNOSIS — Z01818 Encounter for other preprocedural examination: Secondary | ICD-10-CM | POA: Diagnosis present

## 2013-12-08 DIAGNOSIS — E559 Vitamin D deficiency, unspecified: Secondary | ICD-10-CM | POA: Diagnosis not present

## 2013-12-08 DIAGNOSIS — E785 Hyperlipidemia, unspecified: Secondary | ICD-10-CM | POA: Diagnosis not present

## 2013-12-08 LAB — BASIC METABOLIC PANEL
Anion gap: 17 — ABNORMAL HIGH (ref 5–15)
BUN: 11 mg/dL (ref 6–23)
CALCIUM: 9.7 mg/dL (ref 8.4–10.5)
CO2: 24 mEq/L (ref 19–32)
Chloride: 88 mEq/L — ABNORMAL LOW (ref 96–112)
Creatinine, Ser: 0.97 mg/dL (ref 0.50–1.35)
Glucose, Bld: 103 mg/dL — ABNORMAL HIGH (ref 70–99)
POTASSIUM: 3.6 meq/L — AB (ref 3.7–5.3)
SODIUM: 129 meq/L — AB (ref 137–147)

## 2013-12-08 LAB — URINALYSIS, ROUTINE W REFLEX MICROSCOPIC
BILIRUBIN URINE: NEGATIVE
Glucose, UA: NEGATIVE mg/dL
Hgb urine dipstick: NEGATIVE
Ketones, ur: NEGATIVE mg/dL
Leukocytes, UA: NEGATIVE
Nitrite: NEGATIVE
PH: 6.5 (ref 5.0–8.0)
Protein, ur: NEGATIVE mg/dL
SPECIFIC GRAVITY, URINE: 1.015 (ref 1.005–1.030)
Urobilinogen, UA: 0.2 mg/dL (ref 0.0–1.0)

## 2013-12-08 LAB — PROTIME-INR
INR: 1.08 (ref 0.00–1.49)
PROTHROMBIN TIME: 14.1 s (ref 11.6–15.2)

## 2013-12-08 LAB — TYPE AND SCREEN
ABO/RH(D): A POS
Antibody Screen: NEGATIVE

## 2013-12-08 LAB — CBC WITH DIFFERENTIAL/PLATELET
BASOS ABS: 0.1 10*3/uL (ref 0.0–0.1)
Basophils Relative: 1 % (ref 0–1)
EOS PCT: 2 % (ref 0–5)
Eosinophils Absolute: 0.1 10*3/uL (ref 0.0–0.7)
HCT: 43 % (ref 39.0–52.0)
Hemoglobin: 15.3 g/dL (ref 13.0–17.0)
LYMPHS PCT: 30 % (ref 12–46)
Lymphs Abs: 2.1 10*3/uL (ref 0.7–4.0)
MCH: 33.2 pg (ref 26.0–34.0)
MCHC: 35.6 g/dL (ref 30.0–36.0)
MCV: 93.3 fL (ref 78.0–100.0)
Monocytes Absolute: 0.7 10*3/uL (ref 0.1–1.0)
Monocytes Relative: 10 % (ref 3–12)
NEUTROS PCT: 57 % (ref 43–77)
Neutro Abs: 4.1 10*3/uL (ref 1.7–7.7)
PLATELETS: 302 10*3/uL (ref 150–400)
RBC: 4.61 MIL/uL (ref 4.22–5.81)
RDW: 12.7 % (ref 11.5–15.5)
WBC: 7 10*3/uL (ref 4.0–10.5)

## 2013-12-08 LAB — APTT: aPTT: 28 seconds (ref 24–37)

## 2013-12-08 LAB — ABO/RH: ABO/RH(D): A POS

## 2013-12-08 LAB — SURGICAL PCR SCREEN
MRSA, PCR: NEGATIVE
STAPHYLOCOCCUS AUREUS: POSITIVE — AB

## 2013-12-08 NOTE — Progress Notes (Signed)
Patient made aware that nasal swab tested positive for staph and [phone]. Patient verbalized understanding.

## 2013-12-09 ENCOUNTER — Encounter (HOSPITAL_COMMUNITY): Payer: Self-pay

## 2013-12-09 NOTE — Progress Notes (Addendum)
Anesthesia Chart Review:  Patient is a 53 year old male scheduled for left TKA on 12/17/13 by Dr. Turner Danielsowan.  History includes HTN, HLD, arthritis, vitamin D deficiency, non-smoker, hernia repair.  PCP is listed as Dr. Lucky CowboyWilliam McKeown.  Vitals: HR 111, BP 130/74, RR 20, 97% RA.  HR wasn't rechecked before he left PAT. (His HR on 07/22/13 was 80 bpm.  No known history of arrhythmias.)   Meds: Vitamin D, Diclofenac, potassium OTC, ASA 325 mg, lisinopril-HCTZ.  EKG on 07/22/13 showed: NSR.   CXR on 12/08/13 showed: No active cardiopulmonary disease.  Preoperative labs noted.  Na 129, previously low normal at 135. CBC, PT/PTT WNL.  T&S done.  AST/ALT were elevated (< 2X normal) on 07/22/13.  He tested non-reactive/negative for Hepatitis A, B, C, HIV at that time. He admits to drinking 2-3 beers/day. It's too late to add any additional labs.  I called patient, and he can come in to PAT on 12/11/13 at 10 AM to recheck Na and HFP.  Additional recommendations pending follow-up lab results.  Velna Ochsllison Taliah Porche, PA-C [facility]/Anesthesiology Phone [phone] 12/09/2013 4:27 PM  Addendum: CMET results from today noted.  Na now 133. AST/ALT WNL.  Labs appear acceptable for OR.    Velna Ochsllison Tearsa Kowalewski, PA-C [facility]Anesthesiology Phone [phone] 12/11/2013 11:16 AM

## 2013-12-11 ENCOUNTER — Encounter (HOSPITAL_COMMUNITY)
Admission: RE | Admit: 2013-12-11 | Discharge: 2013-12-11 | Disposition: A | Discharge status: Home / Self Care | Payer: Commercial Managed Care - PPO | Source: Ambulatory Visit | Attending: Orthopedic Surgery | Admitting: Orthopedic Surgery

## 2013-12-11 DIAGNOSIS — I10 Essential (primary) hypertension: Secondary | ICD-10-CM | POA: Insufficient documentation

## 2013-12-11 DIAGNOSIS — Z01818 Encounter for other preprocedural examination: Secondary | ICD-10-CM | POA: Insufficient documentation

## 2013-12-11 DIAGNOSIS — M129 Arthropathy, unspecified: Secondary | ICD-10-CM | POA: Insufficient documentation

## 2013-12-11 DIAGNOSIS — E785 Hyperlipidemia, unspecified: Secondary | ICD-10-CM | POA: Insufficient documentation

## 2013-12-11 DIAGNOSIS — E559 Vitamin D deficiency, unspecified: Secondary | ICD-10-CM | POA: Insufficient documentation

## 2013-12-11 HISTORY — DX: Unspecified osteoarthritis, unspecified site: M19.90

## 2013-12-11 LAB — COMPREHENSIVE METABOLIC PANEL
ALK PHOS: 73 U/L (ref 39–117)
ALT: 31 U/L (ref 0–53)
AST: 29 U/L (ref 0–37)
Albumin: 4.1 g/dL (ref 3.5–5.2)
Anion gap: 15 (ref 5–15)
BILIRUBIN TOTAL: 0.3 mg/dL (ref 0.3–1.2)
BUN: 12 mg/dL (ref 6–23)
CHLORIDE: 95 meq/L — AB (ref 96–112)
CO2: 23 mEq/L (ref 19–32)
Calcium: 9.6 mg/dL (ref 8.4–10.5)
Creatinine, Ser: 1.23 mg/dL (ref 0.50–1.35)
GFR, EST AFRICAN AMERICAN: 78 mL/min — AB (ref >90)
GFR, EST NON AFRICAN AMERICAN: 67 mL/min — AB (ref >90)
GLUCOSE: 111 mg/dL — AB (ref 70–99)
POTASSIUM: 4 meq/L (ref 3.7–5.3)
SODIUM: 133 meq/L — AB (ref 137–147)
Total Protein: 7.2 g/dL (ref 6.0–8.3)

## 2013-12-11 NOTE — Progress Notes (Signed)
Patient came back in for CMET lab draw.   DA

## 2013-12-16 MED ORDER — DEXTROSE-NACL 5-0.45 % IV SOLN: INTRAVENOUS | Status: DC

## 2013-12-16 MED ORDER — CEFAZOLIN SODIUM-DEXTROSE 2-3 GM-% IV SOLR
2.0000 g | INTRAVENOUS | Status: AC
  Administered 2013-12-17: 2 g via INTRAVENOUS
  Filled 2013-12-16: qty 50

## 2013-12-16 NOTE — H&P (Signed)
TOTAL KNEE ADMISSION H&P  Patient is being admitted for left total knee arthroplasty.  Subjective:  Chief Complaint:left knee pain.  HPI: Jermaine Stewart, 53 y.o. male, has a history of pain and functional disability in the left knee due to arthritis and has failed non-surgical conservative treatments for greater than 12 weeks to includeNSAID's and/or analgesics, corticosteriod injections, viscosupplementation injections, flexibility and strengthening excercises, weight reduction as appropriate and activity modification.  Onset of symptoms was gradual, starting 10 years ago with gradually worsening course since that time. The patient noted no past surgery on the left knee(s).  Patient currently rates pain in the left knee(s) at 10 out of 10 with activity. Patient has night pain, worsening of pain with activity and weight bearing, pain that interferes with activities of daily living, pain with passive range of motion, crepitus and joint swelling.  Patient has evidence of subchondral sclerosis, periarticular osteophytes and joint space narrowing by imaging studies.  There is no active infection.  Patient Active Problem List   Diagnosis Date Noted  . Other abnormal glucose 07/22/2013  . Encounter for long-term (current) use of other medications 07/22/2013  . Hypertension 03/12/2013  . Vitamin D Deficiency   . Hyperlipidemia    Past Medical History  Diagnosis Date  . Hyperlipidemia   . Unspecified vitamin D deficiency   . Hypertension   . Arthritis     Past Surgical History  Procedure Laterality Date  . Hernia repair Bilateral     15 years ago    No prescriptions prior to admission   No Known Allergies  History  Substance Use Topics  . Smoking status: Never Smoker   . Smokeless tobacco: Current User  . Alcohol Use: 1.2 oz/week    2 Cans of beer per week     Comment: 2-3 beer a day    Family History  Problem Relation Age of Onset  . Cancer Mother     BREAST  . Hypertension  Brother   . Stroke Brother   . Hypertension Brother   . Hypertension Brother      Review of Systems  Constitutional: Negative.   HENT: Negative.   Eyes: Negative.   Respiratory: Negative.   Cardiovascular: Negative.   Gastrointestinal: Negative.   Genitourinary: Negative.   Musculoskeletal: Positive for joint pain.  Skin: Negative.   Neurological: Negative.   Endo/Heme/Allergies: Negative.   Psychiatric/Behavioral: Negative.     Objective:  Physical Exam  Constitutional: He is oriented to person, place, and time. He appears well-developed and well-nourished.  HENT:  Head: Normocephalic and atraumatic.  Eyes: Pupils are equal, round, and reactive to light.  Neck: Normal range of motion. Neck supple.  Cardiovascular: Intact distal pulses.   Respiratory: Effort normal.  Musculoskeletal: He exhibits tenderness.  Bilateral varus deformities of his knees, tender along the medial joint line he's maintain a good range of motion with extension of 2 and flexion to 135.    Neurological: He is alert and oriented to person, place, and time.  Skin: Skin is warm and dry.  Psychiatric: He has a normal mood and affect. His behavior is normal. Judgment and thought content normal.    Vital signs in last 24 hours:    Labs:   Estimated body mass index is 24.14 kg/(m^2) as calculated from the following:   Height as of 07/22/13: 5' 9.5" (1.765 m).   Weight as of 07/22/13: 75.206 kg (165 lb 12.8 oz).   Imaging Review Plain radiographs demonstrate bilateral AP weightbearing,  bilateral Zoila ShutterRosenberg, lateral and sunrise views of the left knee are taken and reviewed in office today.  Patient does have obvious medial joint space narrowing bilaterally.  Patient also has moderate patellofemoral arthritis with obvious spurring.  Assessment/Plan:  End stage arthritis, left knee   The patient history, physical examination, clinical judgment of the provider and imaging studies are consistent with end  stage degenerative joint disease of the left knee(s) and total knee arthroplasty is deemed medically necessary. The treatment options including medical management, injection therapy arthroscopy and arthroplasty were discussed at length. The risks and benefits of total knee arthroplasty were presented and reviewed. The risks due to aseptic loosening, infection, stiffness, patella tracking problems, thromboembolic complications and other imponderables were discussed. The patient acknowledged the explanation, agreed to proceed with the plan and consent was signed. Patient is being admitted for inpatient treatment for surgery, pain control, PT, OT, prophylactic antibiotics, VTE prophylaxis, progressive ambulation and ADL's and discharge planning. The patient is planning to be discharged home with home health services

## 2013-12-17 ENCOUNTER — Encounter (HOSPITAL_COMMUNITY): Payer: Self-pay | Admitting: *Deleted

## 2013-12-17 ENCOUNTER — Inpatient Hospital Stay (HOSPITAL_COMMUNITY): Payer: Commercial Managed Care - PPO | Admitting: Anesthesiology

## 2013-12-17 ENCOUNTER — Encounter (HOSPITAL_COMMUNITY)
Admission: RE | Disposition: A | Discharge status: Home Health | Payer: Self-pay | Source: Ambulatory Visit | Attending: Orthopedic Surgery

## 2013-12-17 ENCOUNTER — Inpatient Hospital Stay (HOSPITAL_COMMUNITY)
Admission: RE | Admit: 2013-12-17 | Discharge: 2013-12-19 | DRG: 470 | Disposition: A | Discharge status: Home Health | Payer: Commercial Managed Care - PPO | Source: Ambulatory Visit | Attending: Orthopedic Surgery | Admitting: Orthopedic Surgery

## 2013-12-17 ENCOUNTER — Encounter (HOSPITAL_COMMUNITY): Payer: Commercial Managed Care - PPO | Admitting: Vascular Surgery

## 2013-12-17 DIAGNOSIS — M1710 Unilateral primary osteoarthritis, unspecified knee: Secondary | ICD-10-CM

## 2013-12-17 DIAGNOSIS — R338 Other retention of urine: Secondary | ICD-10-CM | POA: Diagnosis not present

## 2013-12-17 DIAGNOSIS — Z8249 Family history of ischemic heart disease and other diseases of the circulatory system: Secondary | ICD-10-CM

## 2013-12-17 DIAGNOSIS — Z7982 Long term (current) use of aspirin: Secondary | ICD-10-CM

## 2013-12-17 DIAGNOSIS — M25562 Pain in left knee: Secondary | ICD-10-CM | POA: Diagnosis present

## 2013-12-17 DIAGNOSIS — E559 Vitamin D deficiency, unspecified: Secondary | ICD-10-CM | POA: Diagnosis present

## 2013-12-17 DIAGNOSIS — I10 Essential (primary) hypertension: Secondary | ICD-10-CM | POA: Diagnosis present

## 2013-12-17 DIAGNOSIS — Z79899 Other long term (current) drug therapy: Secondary | ICD-10-CM | POA: Diagnosis not present

## 2013-12-17 DIAGNOSIS — E785 Hyperlipidemia, unspecified: Secondary | ICD-10-CM | POA: Diagnosis present

## 2013-12-17 DIAGNOSIS — R112 Nausea with vomiting, unspecified: Secondary | ICD-10-CM | POA: Diagnosis not present

## 2013-12-17 DIAGNOSIS — D62 Acute posthemorrhagic anemia: Secondary | ICD-10-CM | POA: Diagnosis not present

## 2013-12-17 DIAGNOSIS — M1712 Unilateral primary osteoarthritis, left knee: Secondary | ICD-10-CM | POA: Diagnosis present

## 2013-12-17 DIAGNOSIS — Z823 Family history of stroke: Secondary | ICD-10-CM | POA: Diagnosis not present

## 2013-12-17 HISTORY — PX: TOTAL KNEE ARTHROPLASTY: SHX125

## 2013-12-17 SURGERY — ARTHROPLASTY, KNEE, TOTAL
Anesthesia: General | Site: Knee | Laterality: Left

## 2013-12-17 MED ORDER — BISACODYL 5 MG PO TBEC: 5.0000 mg | DELAYED_RELEASE_TABLET | Freq: Every day | ORAL | Status: DC | PRN

## 2013-12-17 MED ORDER — FENTANYL CITRATE 0.05 MG/ML IJ SOLN
INTRAMUSCULAR | Status: DC | PRN
  Administered 2013-12-17: 25 ug via INTRAVENOUS
  Administered 2013-12-17 (×2): 50 ug via INTRAVENOUS
  Administered 2013-12-17 (×2): 100 ug via INTRAVENOUS
  Administered 2013-12-17: 25 ug via INTRAVENOUS
  Administered 2013-12-17: 100 ug via INTRAVENOUS
  Administered 2013-12-17: 50 ug via INTRAVENOUS

## 2013-12-17 MED ORDER — FENTANYL CITRATE 0.05 MG/ML IJ SOLN
INTRAMUSCULAR | Status: AC
  Filled 2013-12-17: qty 5

## 2013-12-17 MED ORDER — HYDROMORPHONE HCL 1 MG/ML IJ SOLN
0.5000 mg | INTRAMUSCULAR | Status: DC | PRN
  Administered 2013-12-17 – 2013-12-18 (×5): 1 mg via INTRAVENOUS
  Filled 2013-12-17 (×5): qty 1

## 2013-12-17 MED ORDER — GLYCOPYRROLATE 0.2 MG/ML IJ SOLN
INTRAMUSCULAR | Status: DC | PRN
  Administered 2013-12-17: 0.4 mg via INTRAVENOUS

## 2013-12-17 MED ORDER — CHLORHEXIDINE GLUCONATE 4 % EX LIQD
60.0000 mL | Freq: Once | CUTANEOUS | Status: DC
  Filled 2013-12-17: qty 60

## 2013-12-17 MED ORDER — HYDROMORPHONE HCL 1 MG/ML IJ SOLN
INTRAMUSCULAR | Status: AC
  Administered 2013-12-17: 0.5 mg via INTRAVENOUS
  Filled 2013-12-17: qty 1

## 2013-12-17 MED ORDER — ACETAMINOPHEN 650 MG RE SUPP: 650.0000 mg | Freq: Four times a day (QID) | RECTAL | Status: DC | PRN

## 2013-12-17 MED ORDER — ONDANSETRON HCL 4 MG/2ML IJ SOLN
INTRAMUSCULAR | Status: DC | PRN
  Administered 2013-12-17: 4 mg via INTRAVENOUS

## 2013-12-17 MED ORDER — METHOCARBAMOL 500 MG PO TABS: 500.0000 mg | ORAL_TABLET | Freq: Two times a day (BID) | ORAL | Status: DC

## 2013-12-17 MED ORDER — KCL IN DEXTROSE-NACL 20-5-0.45 MEQ/L-%-% IV SOLN
INTRAVENOUS | Status: DC
  Administered 2013-12-17 – 2013-12-19 (×2): via INTRAVENOUS
  Filled 2013-12-17 (×7): qty 1000

## 2013-12-17 MED ORDER — FLEET ENEMA 7-19 GM/118ML RE ENEM: 1.0000 | ENEMA | Freq: Once | RECTAL | Status: AC | PRN

## 2013-12-17 MED ORDER — HYDROMORPHONE HCL 1 MG/ML IJ SOLN
0.2500 mg | INTRAMUSCULAR | Status: DC | PRN
  Administered 2013-12-17 (×4): 0.5 mg via INTRAVENOUS

## 2013-12-17 MED ORDER — DOCUSATE SODIUM 100 MG PO CAPS
100.0000 mg | ORAL_CAPSULE | Freq: Two times a day (BID) | ORAL | Status: DC
  Administered 2013-12-17 – 2013-12-19 (×4): 100 mg via ORAL
  Filled 2013-12-17 (×5): qty 1

## 2013-12-17 MED ORDER — ONDANSETRON HCL 4 MG/2ML IJ SOLN: 4.0000 mg | Freq: Four times a day (QID) | INTRAMUSCULAR | Status: DC | PRN

## 2013-12-17 MED ORDER — LISINOPRIL-HYDROCHLOROTHIAZIDE 20-25 MG PO TABS: 1.0000 | ORAL_TABLET | Freq: Every day | ORAL | Status: DC

## 2013-12-17 MED ORDER — LISINOPRIL 20 MG PO TABS
20.0000 mg | ORAL_TABLET | Freq: Every day | ORAL | Status: DC
  Administered 2013-12-17 – 2013-12-19 (×3): 20 mg via ORAL
  Filled 2013-12-17 (×3): qty 1

## 2013-12-17 MED ORDER — PHENOL 1.4 % MT LIQD: 1.0000 | OROMUCOSAL | Status: DC | PRN

## 2013-12-17 MED ORDER — CEFUROXIME SODIUM 1.5 G IJ SOLR
INTRAMUSCULAR | Status: DC | PRN
  Administered 2013-12-17: 1.5 g

## 2013-12-17 MED ORDER — ACETAMINOPHEN 325 MG PO TABS: 650.0000 mg | ORAL_TABLET | Freq: Four times a day (QID) | ORAL | Status: DC | PRN

## 2013-12-17 MED ORDER — TRANEXAMIC ACID 100 MG/ML IV SOLN
1000.0000 mg | INTRAVENOUS | Status: DC
  Filled 2013-12-17: qty 10

## 2013-12-17 MED ORDER — BUPIVACAINE-EPINEPHRINE (PF) 0.25% -1:200000 IJ SOLN
INTRAMUSCULAR | Status: AC
  Filled 2013-12-17: qty 30

## 2013-12-17 MED ORDER — METOCLOPRAMIDE HCL 10 MG PO TABS: 5.0000 mg | ORAL_TABLET | Freq: Three times a day (TID) | ORAL | Status: DC | PRN

## 2013-12-17 MED ORDER — OXYCODONE HCL 5 MG PO TABS
5.0000 mg | ORAL_TABLET | ORAL | Status: DC | PRN
  Administered 2013-12-17 – 2013-12-19 (×8): 10 mg via ORAL
  Filled 2013-12-17 (×9): qty 2

## 2013-12-17 MED ORDER — FENTANYL CITRATE 0.05 MG/ML IJ SOLN
INTRAMUSCULAR | Status: AC
  Administered 2013-12-17: 100 ug
  Filled 2013-12-17: qty 2

## 2013-12-17 MED ORDER — ASPIRIN EC 325 MG PO TBEC
325.0000 mg | DELAYED_RELEASE_TABLET | Freq: Every day | ORAL | Status: DC
  Administered 2013-12-18 – 2013-12-19 (×2): 325 mg via ORAL
  Filled 2013-12-17 (×3): qty 1

## 2013-12-17 MED ORDER — SENNOSIDES-DOCUSATE SODIUM 8.6-50 MG PO TABS: 1.0000 | ORAL_TABLET | Freq: Every evening | ORAL | Status: DC | PRN

## 2013-12-17 MED ORDER — SODIUM CHLORIDE 0.9 % IR SOLN
Status: DC | PRN
  Administered 2013-12-17: 3000 mL
  Administered 2013-12-17: 1000 mL

## 2013-12-17 MED ORDER — HYDROCHLOROTHIAZIDE 25 MG PO TABS
25.0000 mg | ORAL_TABLET | Freq: Every day | ORAL | Status: DC
  Administered 2013-12-17 – 2013-12-19 (×3): 25 mg via ORAL
  Filled 2013-12-17 (×3): qty 1

## 2013-12-17 MED ORDER — ALUM & MAG HYDROXIDE-SIMETH 200-200-20 MG/5ML PO SUSP: 30.0000 mL | ORAL | Status: DC | PRN

## 2013-12-17 MED ORDER — BUPIVACAINE LIPOSOME 1.3 % IJ SUSP
INTRAMUSCULAR | Status: DC | PRN
  Administered 2013-12-17: 20 mL

## 2013-12-17 MED ORDER — BUPIVACAINE LIPOSOME 1.3 % IJ SUSP
20.0000 mL | Freq: Once | INTRAMUSCULAR | Status: DC
  Filled 2013-12-17: qty 20

## 2013-12-17 MED ORDER — MIDAZOLAM HCL 2 MG/2ML IJ SOLN
INTRAMUSCULAR | Status: AC
  Administered 2013-12-17: 2 mg
  Filled 2013-12-17: qty 2

## 2013-12-17 MED ORDER — PROPOFOL 10 MG/ML IV BOLUS
INTRAVENOUS | Status: AC
  Filled 2013-12-17: qty 20

## 2013-12-17 MED ORDER — NEOSTIGMINE METHYLSULFATE 10 MG/10ML IV SOLN
INTRAVENOUS | Status: DC | PRN
  Administered 2013-12-17: 3 mg via INTRAVENOUS

## 2013-12-17 MED ORDER — MIDAZOLAM HCL 2 MG/2ML IJ SOLN
INTRAMUSCULAR | Status: AC
  Filled 2013-12-17: qty 2

## 2013-12-17 MED ORDER — MENTHOL 3 MG MT LOZG: 1.0000 | LOZENGE | OROMUCOSAL | Status: DC | PRN

## 2013-12-17 MED ORDER — SODIUM CHLORIDE 0.9 % IJ SOLN
INTRAMUSCULAR | Status: DC | PRN
  Administered 2013-12-17: 40 mL

## 2013-12-17 MED ORDER — SODIUM CHLORIDE 0.9 % IV SOLN
1000.0000 mg | INTRAVENOUS | Status: AC
  Administered 2013-12-17: 1000 mg via INTRAVENOUS
  Filled 2013-12-17: qty 10

## 2013-12-17 MED ORDER — ASPIRIN EC 325 MG PO TBEC: 325.0000 mg | DELAYED_RELEASE_TABLET | Freq: Two times a day (BID) | ORAL | Status: DC

## 2013-12-17 MED ORDER — ONDANSETRON HCL 4 MG PO TABS: 4.0000 mg | ORAL_TABLET | Freq: Four times a day (QID) | ORAL | Status: DC | PRN

## 2013-12-17 MED ORDER — MIDAZOLAM HCL 5 MG/5ML IJ SOLN
INTRAMUSCULAR | Status: DC | PRN
  Administered 2013-12-17: 2 mg via INTRAVENOUS

## 2013-12-17 MED ORDER — LIDOCAINE HCL (CARDIAC) 20 MG/ML IV SOLN
INTRAVENOUS | Status: DC | PRN
  Administered 2013-12-17: 40 mg via INTRAVENOUS

## 2013-12-17 MED ORDER — PROPOFOL 10 MG/ML IV BOLUS
INTRAVENOUS | Status: DC | PRN
  Administered 2013-12-17: 40 mg via INTRAVENOUS
  Administered 2013-12-17: 160 mg via INTRAVENOUS

## 2013-12-17 MED ORDER — METOCLOPRAMIDE HCL 5 MG/ML IJ SOLN
5.0000 mg | Freq: Three times a day (TID) | INTRAMUSCULAR | Status: DC | PRN
  Administered 2013-12-17: 10 mg via INTRAVENOUS
  Filled 2013-12-17: qty 2

## 2013-12-17 MED ORDER — METHOCARBAMOL 500 MG PO TABS
500.0000 mg | ORAL_TABLET | Freq: Four times a day (QID) | ORAL | Status: DC | PRN
  Administered 2013-12-17 – 2013-12-18 (×3): 500 mg via ORAL
  Filled 2013-12-17 (×4): qty 1

## 2013-12-17 MED ORDER — METHOCARBAMOL 1000 MG/10ML IJ SOLN
500.0000 mg | Freq: Four times a day (QID) | INTRAMUSCULAR | Status: DC | PRN
  Administered 2013-12-17: 500 mg via INTRAVENOUS
  Filled 2013-12-17: qty 5

## 2013-12-17 MED ORDER — CEFUROXIME SODIUM 1.5 G IJ SOLR
INTRAMUSCULAR | Status: AC
  Filled 2013-12-17: qty 1.5

## 2013-12-17 MED ORDER — DIPHENHYDRAMINE HCL 12.5 MG/5ML PO ELIX
12.5000 mg | ORAL_SOLUTION | ORAL | Status: DC | PRN
Start: 2013-12-17 — End: 2013-12-19

## 2013-12-17 MED ORDER — ROCURONIUM BROMIDE 100 MG/10ML IV SOLN
INTRAVENOUS | Status: DC | PRN
  Administered 2013-12-17: 35 mg via INTRAVENOUS
  Administered 2013-12-17: 15 mg via INTRAVENOUS

## 2013-12-17 MED ORDER — OXYCODONE-ACETAMINOPHEN 5-325 MG PO TABS: 1.0000 | ORAL_TABLET | ORAL | Status: DC | PRN

## 2013-12-17 MED ORDER — LACTATED RINGERS IV SOLN
INTRAVENOUS | Status: DC
  Administered 2013-12-17: 11:00:00 via INTRAVENOUS

## 2013-12-17 SURGICAL SUPPLY — 58 items
BANDAGE ESMARK 6X9 LF (GAUZE/BANDAGES/DRESSINGS) ×1 IMPLANT
BLADE SAG 18X100X1.27 (BLADE) ×4 IMPLANT
BLADE SAW SGTL 13X75X1.27 (BLADE) ×2 IMPLANT
BLADE SURG ROTATE 9660 (MISCELLANEOUS) IMPLANT
BNDG ELASTIC 6X10 VLCR STRL LF (GAUZE/BANDAGES/DRESSINGS) ×2 IMPLANT
BNDG ESMARK 6X9 LF (GAUZE/BANDAGES/DRESSINGS) ×2
BOWL SMART MIX CTS (DISPOSABLE) ×2 IMPLANT
CAP KNEE ATTUNE RP ×2 IMPLANT
CEMENT HV SMART SET (Cement) ×4 IMPLANT
COVER SURGICAL LIGHT HANDLE (MISCELLANEOUS) ×2 IMPLANT
CUFF TOURNIQUET SINGLE 34IN LL (TOURNIQUET CUFF) ×2 IMPLANT
CUFF TOURNIQUET SINGLE 44IN (TOURNIQUET CUFF) IMPLANT
DRAPE EXTREMITY T 121X128X90 (DRAPE) ×2 IMPLANT
DRAPE U-SHAPE 47X51 STRL (DRAPES) ×2 IMPLANT
DURAPREP 26ML APPLICATOR (WOUND CARE) ×4 IMPLANT
ELECT CAUTERY BLADE 6.4 (BLADE) ×2 IMPLANT
ELECT REM PT RETURN 9FT ADLT (ELECTROSURGICAL) ×2
ELECTRODE REM PT RTRN 9FT ADLT (ELECTROSURGICAL) ×1 IMPLANT
EVACUATOR 1/8 PVC DRAIN (DRAIN) ×2 IMPLANT
GAUZE SPONGE 4X4 12PLY STRL (GAUZE/BANDAGES/DRESSINGS) ×4 IMPLANT
GAUZE XEROFORM 1X8 LF (GAUZE/BANDAGES/DRESSINGS) ×2 IMPLANT
GLOVE BIO SURGEON STRL SZ7.5 (GLOVE) ×2 IMPLANT
GLOVE BIO SURGEON STRL SZ8.5 (GLOVE) ×2 IMPLANT
GLOVE BIOGEL PI IND STRL 8 (GLOVE) ×1 IMPLANT
GLOVE BIOGEL PI IND STRL 9 (GLOVE) ×1 IMPLANT
GLOVE BIOGEL PI INDICATOR 8 (GLOVE) ×1
GLOVE BIOGEL PI INDICATOR 9 (GLOVE) ×1
GOWN STRL REUS W/ TWL LRG LVL3 (GOWN DISPOSABLE) ×1 IMPLANT
GOWN STRL REUS W/ TWL XL LVL3 (GOWN DISPOSABLE) ×2 IMPLANT
GOWN STRL REUS W/TWL LRG LVL3 (GOWN DISPOSABLE) ×1
GOWN STRL REUS W/TWL XL LVL3 (GOWN DISPOSABLE) ×2
HANDPIECE INTERPULSE COAX TIP (DISPOSABLE) ×1
HOOD PEEL AWAY FACE SHEILD DIS (HOOD) ×6 IMPLANT
KIT BASIN OR (CUSTOM PROCEDURE TRAY) ×2 IMPLANT
KIT ROOM TURNOVER OR (KITS) ×2 IMPLANT
MANIFOLD NEPTUNE II (INSTRUMENTS) ×2 IMPLANT
NDL SAFETY ECLIPSE 18X1.5 (NEEDLE) IMPLANT
NEEDLE 22X1 1/2 (OR ONLY) (NEEDLE) ×2 IMPLANT
NEEDLE HYPO 18GX1.5 SHARP (NEEDLE)
NEEDLE SPNL 18GX3.5 QUINCKE PK (NEEDLE) IMPLANT
NS IRRIG 1000ML POUR BTL (IV SOLUTION) ×2 IMPLANT
PACK TOTAL JOINT (CUSTOM PROCEDURE TRAY) ×2 IMPLANT
PAD ARMBOARD 7.5X6 YLW CONV (MISCELLANEOUS) ×2 IMPLANT
PADDING CAST COTTON 6X4 STRL (CAST SUPPLIES) ×2 IMPLANT
SET HNDPC FAN SPRY TIP SCT (DISPOSABLE) ×1 IMPLANT
STAPLER VISISTAT 35W (STAPLE) ×2 IMPLANT
SUCTION FRAZIER TIP 10 FR DISP (SUCTIONS) ×2 IMPLANT
SUT VIC AB 0 CTX 36 (SUTURE) ×1
SUT VIC AB 0 CTX36XBRD ANTBCTR (SUTURE) ×1 IMPLANT
SUT VIC AB 1 CTX 36 (SUTURE) ×1
SUT VIC AB 1 CTX36XBRD ANBCTR (SUTURE) ×1 IMPLANT
SUT VIC AB 2-0 CT1 27 (SUTURE) ×1
SUT VIC AB 2-0 CT1 TAPERPNT 27 (SUTURE) ×1 IMPLANT
SYR 30ML LL (SYRINGE) ×2 IMPLANT
SYR 50ML LL SCALE MARK (SYRINGE) ×2 IMPLANT
TOWEL OR 17X24 6PK STRL BLUE (TOWEL DISPOSABLE) ×2 IMPLANT
TOWEL OR 17X26 10 PK STRL BLUE (TOWEL DISPOSABLE) ×2 IMPLANT
WATER STERILE IRR 1000ML POUR (IV SOLUTION) ×2 IMPLANT

## 2013-12-17 NOTE — Anesthesia Postprocedure Evaluation (Signed)
  Anesthesia Post-op Note  Patient: Myles RosenthalJeffrey Walczyk  Procedure(s) Performed: Procedure(s): TOTAL KNEE ARTHROPLASTY (Left)  Patient Location: PACU  Anesthesia Type:GA combined with regional for post-op pain  Level of Consciousness: awake, alert  and oriented  Airway and Oxygen Therapy: Patient Spontanous Breathing  Post-op Pain: none  Post-op Assessment: Post-op Vital signs reviewed  Post-op Vital Signs: Reviewed  Last Vitals:  Filed Vitals:   12/17/13 1626  BP:   Pulse: 115  Temp:   Resp: 19    Complications: No apparent anesthesia complications

## 2013-12-17 NOTE — Transfer of Care (Signed)
Immediate Anesthesia Transfer of Care Note  Patient: Myles RosenthalJeffrey Riester  Procedure(s) Performed: Procedure(s): TOTAL KNEE ARTHROPLASTY (Left)  Patient Location: PACU  Anesthesia Type:GA combined with regional for post-op pain  Level of Consciousness: awake, alert , oriented and patient cooperative  Airway & Oxygen Therapy: Patient Spontanous Breathing and Patient connected to nasal cannula oxygen  Post-op Assessment: Report given to PACU RN, Post -op Vital signs reviewed and stable and Patient moving all extremities  Post vital signs: Reviewed and stable  Complications: No apparent anesthesia complications

## 2013-12-17 NOTE — Anesthesia Preprocedure Evaluation (Addendum)
Anesthesia Evaluation  Patient identified by MRN, date of birth, ID band Patient awake    Reviewed: Allergy & Precautions, H&P , NPO status   Airway Mallampati: II       Dental  (+) Teeth Intact   Pulmonary neg pulmonary ROS,  breath sounds clear to auscultation        Cardiovascular hypertension, Rhythm:Regular Rate:Normal     Neuro/Psych    GI/Hepatic negative GI ROS, Neg liver ROS,   Endo/Other    Renal/GU negative Renal ROS     Musculoskeletal   Abdominal   Peds  Hematology   Anesthesia Other Findings   Reproductive/Obstetrics                            Anesthesia Physical Anesthesia Plan  ASA: III  Anesthesia Plan: General   Post-op Pain Management:    Induction: Intravenous  Airway Management Planned: Oral ETT  Additional Equipment:   Intra-op Plan:   Post-operative Plan: Extubation in OR  Informed Consent: I have reviewed the patients History and Physical, chart, labs and discussed the procedure including the risks, benefits and alternatives for the proposed anesthesia with the patient or authorized representative who has indicated his/her understanding and acceptance.   Dental advisory given  Plan Discussed with: CRNA and Anesthesiologist  Anesthesia Plan Comments:         Anesthesia Quick Evaluation

## 2013-12-17 NOTE — Plan of Care (Signed)
Problem: Consults Goal: Diagnosis- Total Joint Replacement Outcome: Completed/Met Date Met:  12/17/13 Primary Total Knee

## 2013-12-17 NOTE — Interval H&P Note (Signed)
History and Physical Interval Note:  12/17/2013 12:32 PM  Jermaine RosenthalJeffrey Haese  has presented today for surgery, with the diagnosis of DEGENERATIVE JOINT DISEASE LEFT KNEE  The various methods of treatment have been discussed with the patient and family. After consideration of risks, benefits and other options for treatment, the patient has consented to  Procedure(s): TOTAL KNEE ARTHROPLASTY (Left) as a surgical intervention .  The patient's history has been reviewed, patient examined, no change in status, stable for surgery.  I have reviewed the patient's chart and labs.  Questions were answered to the patient's satisfaction.     Nestor LewandowskyOWAN,Daielle Melcher J

## 2013-12-17 NOTE — Op Note (Signed)
PATIENT ID:      Jermaine RosenthalJeffrey Keithley  MRN:     [id number] DOB/AGE:    51-01-1961 / 51 y.o.       OPERATIVE REPORT    DATE OF PROCEDURE:  12/17/2013       PREOPERATIVE DIAGNOSIS:   DEGENERATIVE JOINT DISEASE LEFT KNEE      Estimated body mass index is 23.32 kg/(m^2) as calculated from the following:   Height as of this encounter: 5\' 9"  (1.753 m).   Weight as of this encounter: 71.668 kg (158 lb).                                                        POSTOPERATIVE DIAGNOSIS:   DEGENERATIVE JOINT DISEASE LEFT KNEE                                                                      PROCEDURE:  Procedure(s): L TOTAL KNEE ARTHROPLASTY Using DepuyAttune RP implants #6L Femur, #8Tibia, 10mm Attune RP bearing, 41 Patella     SURGEON: Tyshaun Vinzant J    ASSISTANT:   Eric K. [employer] PA-C   (Present and scrubbed throughout the case, critical for assistance with exposure, retraction, instrumentation, and closure.)         ANESTHESIA: GET, ACB, Exparel  DRAINS: 2 medium hemovac in knee   TOURNIQUET TIME: 75min   COMPLICATIONS:  None     SPECIMENS: None   INDICATIONS FOR PROCEDURE: The patient has  DEGENERATIVE JOINT DISEASE LEFT KNEE, varus deformities, XR shows bone on bone arthritis. Patient has failed all conservative measures including anti-inflammatory medicines, narcotics, attempts at  exercise and weight loss, cortisone injections and viscosupplementation.  Risks and benefits of surgery have been discussed, questions answered.   DESCRIPTION OF PROCEDURE: The patient identified by armband, received  IV antibiotics, [facility]. Patient taken to the operating room, appropriate anesthetic  monitors were attached, and general endotracheal anesthesia induced with  the patient in supine position, Foley catheter was inserted. Tourniquet  applied high to the operative thigh. Lateral post and foot positioner  applied to the table, the lower extremity was then prepped and  draped  in usual sterile fashion from the ankle to the tourniquet. Time-out procedure was performed. The limb was wrapped with an Esmarch bandage and the tourniquet inflated to 350 mmHg. We began the operation by making the anterior midline incision starting at handbreadth above the patella going over the patella 1 cm medial to and  4 cm distal to the tibial tubercle. Small bleeders in the skin and the  subcutaneous tissue identified and cauterized. Transverse retinaculum was incised and reflected medially and a medial parapatellar arthrotomy was accomplished. the patella was everted and theprepatellar fat pad resected. The superficial medial collateral  ligament was then elevated from anterior to posterior along the proximal  flare of the tibia and anterior half of the menisci resected. The knee was hyperflexed exposing bone on bone arthritis. Peripheral and notch osteophytes as well as the cruciate ligaments were then resected. We continued to  work our way around posteriorly along the proximal tibia, and externally  rotated the tibia subluxing it out from underneath the femur. A McHale  retractor was placed through the notch and a lateral Hohmann retractor  placed, and we then drilled through the proximal tibia in line with the  axis of the tibia followed by an intramedullary guide rod and 2-degree  posterior slope cutting guide. The tibial cutting guide was pinned into place  allowing resection of 1 mm of bone medially and about 12 mm of bone  laterally because of her varus deformity. Satisfied with the tibial resection, we then  entered the distal femur 2 mm anterior to the PCL origin with the  intramedullary guide rod and applied the distal femoral cutting guide  set at 11mm, with 5 degrees of valgus. This was pinned along the  epicondylar axis. At this point, the distal femoral cut was accomplished without difficulty. We then sized for a #6L femoral component and pinned the guide in 3 degrees  of external rotation.The chamfer cutting guide was pinned into place. The anterior, posterior, and chamfer cuts were accomplished without difficulty followed by  the Attune RP box cutting guide and the box cut. We also removed posterior osteophytes from the posterior femoral condyles. At this  time, the knee was brought into full extension. We checked our  extension and flexion gaps and found them symmetric at 10mm.  The patella thickness measured at 25 mm. We set the cutting guide at 15 and removed the posterior 10 mm  of the patella, sized for a 41 button and drilled the lollipop. The knee  was then once again hyperflexed exposing the proximal tibia. We sized for a #8 tibial base plate, applied the smokestack and the conical reamer followed by the the Delta fin keel punch. We then hammered into place the Attune RP trial femoral component, inserted a 10-mm trial bearing, trial patellar button, and took the knee through range of motion from 0-130 degrees. No thumb pressure was required for patellar  tracking. At this point, all trial components were removed, a double batch of DePuy HV cement with 1500 mg of Zinacef was mixed and applied to all bony metallic mating surfaces except for the posterior condyles of the femur itself. In order, we  hammered into place the tibial tray and removed excess cement, the femoral component and removed excess cement, a 10-mm Sigma RP bearing  was inserted, and the knee brought to full extension with compression.  The patellar button was clamped into place, and excess cement  removed. While the cement cured the wound was irrigated out with normal saline solution pulse lavage, and medium Hemovac drains were placed from an anterolateral  approach. Ligament stability and patellar tracking were checked and found to be excellent. The parapatellar arthrotomy was closed with  running #1 Vicryl suture. The subcutaneous tissue with 0 and 2-0 undyed  Vicryl suture, and the skin  with skin staples. A dressing of Xeroform,  4 x 4, dressing sponges, Webril, and Ace wrap applied. The patient  awakened, extubated, and taken to recovery room without difficulty.   Chloeanne Poteet J 12/17/2013, 2:58 PM

## 2013-12-17 NOTE — Progress Notes (Signed)
Orthopedic Tech Progress Note Patient Details:  Myles RosenthalJeffrey Manahan 02/05/1961 [id number] Applied CPM to LLE.  Applied OHF with trapeze to pt.'s bed.  Left bone foam with pt.'s nurse. CPM Left Knee CPM Left Knee: On Left Knee Flexion (Degrees): 40 Left Knee Extension (Degrees): 10   Lesle ChrisGilliland, Shia Eber L 12/17/2013, 5:51 PM

## 2013-12-17 NOTE — Progress Notes (Signed)
Orthopedic Tech Progress Note Patient Details:  Jermaine RosenthalJeffrey Stewart November 19, 1960 [id number] On cpm at 7:30 pm Patient ID: Jermaine Stewart, male   DOB: November 19, 1960, 53 y.o.   MRN: [id number]   Jermaine Stewart, Jermaine Stewart 12/17/2013, 7:31 PM

## 2013-12-17 NOTE — Anesthesia Procedure Notes (Signed)
Anesthesia Regional Block:  Adductor canal block  Pre-Anesthetic Checklist: ,, timeout performed, Correct Patient, Correct Site, Correct Laterality, Correct Procedure, Correct Position, site marked, Risks and benefits discussed,  Surgical consent,  Pre-op evaluation,  At surgeon's request and post-op pain management  Laterality: Left  Prep: chloraprep       Needles:   Needle Type: Stimulator Needle - 80          Additional Needles:  Procedures: Doppler guided and nerve stimulator Adductor canal block  Nerve Stimulator or Paresthesia:  Response: 0.5 mA,   Additional Responses:   Narrative:  Start time: 12/17/2013 12:05 PM End time: 12/17/2013 12:15 PM Injection made incrementally with aspirations every 5 mL.  Performed by: Personally  Anesthesiologist: Dr. Randa EvensEdwards

## 2013-12-18 LAB — CBC
HEMATOCRIT: 33.9 % — AB (ref 39.0–52.0)
Hemoglobin: 11.8 g/dL — ABNORMAL LOW (ref 13.0–17.0)
MCH: 32.9 pg (ref 26.0–34.0)
MCHC: 34.8 g/dL (ref 30.0–36.0)
MCV: 94.4 fL (ref 78.0–100.0)
Platelets: 240 10*3/uL (ref 150–400)
RBC: 3.59 MIL/uL — ABNORMAL LOW (ref 4.22–5.81)
RDW: 12.5 % (ref 11.5–15.5)
WBC: 8 10*3/uL (ref 4.0–10.5)

## 2013-12-18 LAB — BASIC METABOLIC PANEL
ANION GAP: 14 (ref 5–15)
BUN: 7 mg/dL (ref 6–23)
CALCIUM: 8.9 mg/dL (ref 8.4–10.5)
CO2: 27 mEq/L (ref 19–32)
CREATININE: 0.7 mg/dL (ref 0.50–1.35)
Chloride: 91 mEq/L — ABNORMAL LOW (ref 96–112)
GFR calc non Af Amer: >90 mL/min (ref >90)
Glucose, Bld: 120 mg/dL — ABNORMAL HIGH (ref 70–99)
Potassium: 3.5 mEq/L — ABNORMAL LOW (ref 3.7–5.3)
Sodium: 132 mEq/L — ABNORMAL LOW (ref 137–147)

## 2013-12-18 LAB — URINALYSIS, ROUTINE W REFLEX MICROSCOPIC
Bilirubin Urine: NEGATIVE
Glucose, UA: 250 mg/dL — AB
Hgb urine dipstick: NEGATIVE
Ketones, ur: NEGATIVE mg/dL
Leukocytes, UA: NEGATIVE
Nitrite: NEGATIVE
PROTEIN: NEGATIVE mg/dL
Specific Gravity, Urine: 1.01 (ref 1.005–1.030)
UROBILINOGEN UA: 0.2 mg/dL (ref 0.0–1.0)
pH: 7 (ref 5.0–8.0)

## 2013-12-18 NOTE — Evaluation (Signed)
Physical Therapy Evaluation Patient Details Name: Jermaine RosenthalJeffrey Stewart MRN: [id number] DOB: [birth year] Today's Date: 12/18/2013   History of Present Illness  Pt. underwent L TKA for endstage arthritis  Clinical Impression  This patient underwent a left TKA and presents to PT with anticipated post-op decrease in strength and ROM, decreased functional mobility and gait.  Pt. Will benefit from acute PT to address these and below issues.  Pt. Made good progress in first session today.  Wife present and supportive.  Pt. Indicates he feels more comfortable staying overnight tonight and planning DC tomorrow.       Follow Up Recommendations Home health PT    Equipment Recommendations  None recommended by PT;Other (comment) (equipment has been delivered to pt's home already)    Recommendations for Other Services       Precautions / Restrictions Precautions Precautions: Knee Precaution Booklet Issued: Yes (comment) Precaution Comments: pt. educated on TKA precautions and given exercise/educational sheet to reinforce Restrictions Weight Bearing Restrictions: Yes LLE Weight Bearing: Weight bearing as tolerated      Mobility  Bed Mobility Overal bed mobility: Needs Assistance Bed Mobility: Supine to Sit     Supine to sit: Supervision;HOB elevated     General bed mobility comments: managing own L LE for moving to EOB  Transfers Overall transfer level: Needs assistance Equipment used: Rolling walker (2 wheeled) Transfers: Sit to/from Stand Sit to Stand: Min guard         General transfer comment: min guard assist for sit<>stand and vc's for technique  Ambulation/Gait Ambulation/Gait assistance: Min guard Ambulation Distance (Feet): 100 Feet Assistive device: Rolling walker (2 wheeled) Gait Pattern/deviations: Step-through pattern Gait velocity: decreased   General Gait Details: Pt. educated on emphasizing heel strike during gait; able to bear ~75% WBAT through L LE    Stairs            Wheelchair Mobility    Modified Rankin (Stroke Patients Only)       Balance                                             Pertinent Vitals/Pain Pain Assessment: 0-10 Pain Score: 4  Pain Location: right knee, shin and thigh Pain Descriptors / Indicators: Aching Pain Intervention(s): Premedicated before session;Repositioned    Home Living Family/patient expects to be discharged to:: Private residence Living Arrangements: Spouse/significant other;Children Available Help at Discharge: Available 24 hours/day (wife taking off from work for pt's recovery) Type of Home: House Home Access: Stairs to enter Entrance Stairs-Rails: None Entrance Stairs-Number of Steps: 2 Home Layout: One level Home Equipment: Environmental consultantWalker - 2 wheels;Bedside commode;Other (comment) (CPM has been delivered)      Prior Function Level of Independence: Independent         Comments: work for [employer]     Hand Dominance        Extremity/Trunk Assessment   Upper Extremity Assessment: Overall WFL for tasks assessed           Lower Extremity Assessment: LLE deficits/detail   LLE Deficits / Details: weak quad set, good ankle pumps  Cervical / Trunk Assessment: Normal  Communication   Communication: No difficulties  Cognition Arousal/Alertness: Awake/alert Behavior During Therapy: WFL for tasks assessed/performed Overall Cognitive Status: Within Functional Limits for tasks assessed  General Comments      Exercises Total Joint Exercises Ankle Circles/Pumps: AROM;Both;Supine;Seated;20 reps Quad Sets: AROM;Left;Supine;Seated;15 reps Knee Flexion: AAROM;Left;10 reps;Seated Goniometric ROM: - 10 to ~50       Assessment/Plan    PT Assessment Patient needs continued PT services  PT Diagnosis Difficulty walking;Acute pain;Abnormality of gait   PT Problem List Decreased strength;Decreased range of  motion;Decreased activity tolerance;Decreased mobility;Decreased knowledge of use of DME;Decreased knowledge of precautions;Pain  PT Treatment Interventions DME instruction;Gait training;Stair training;Functional mobility training;Therapeutic activities;Therapeutic exercise;Patient/family education   PT Goals (Current goals can be found in the Care Plan section) Acute Rehab PT Goals Patient Stated Goal: home for recovery PT Goal Formulation: With patient Time For Goal Achievement: 12/25/13 Potential to Achieve Goals: Good    Frequency 7X/week   Barriers to discharge        Co-evaluation               End of Session Equipment Utilized During Treatment: Gait belt Activity Tolerance: Patient tolerated treatment well Patient left: in chair;with call bell/phone within reach;with family/visitor present (wife present) Nurse Communication: Mobility status         Time: [id number] PT Time Calculation (min): 39 min   Charges:   PT Evaluation $Initial PT Evaluation Tier I: 1 Procedure PT Treatments $Gait Training: 8-22 mins $Therapeutic Exercise: 8-22 mins   PT G CodesFerman Hamming:          Cherissa Hook B 12/18/2013, 8:54 AM Weldon PickingSusan Parris Signer PT Acute Rehab Services [phone] Beeper [phone]

## 2013-12-18 NOTE — Progress Notes (Signed)
Patient unable to void after multiple attempts. Bladder scan for . In and out cath done at 0130 got out of clear yellow urine. Encourage patient to increase po fluid intake. Will continue to monitor for voiding.

## 2013-12-18 NOTE — Evaluation (Signed)
Occupational Therapy Evaluation and Discharge Patient Details Name: Jermaine RosenthalJeffrey Stewart MRN: [id number] DOB: [birth year] Today's Date: 12/18/2013    History of Present Illness Pt. underwent L TKA for endstage arthritis   Clinical Impression   This [age] male admitted and underwent above presents to acute OT with all education completed and pt/wife without any further questions about BADLs, we will sign off.    Follow Up Recommendations  No OT follow up          Precautions / Restrictions Precautions Precautions: Knee Precaution Booklet Issued: Yes (comment) Precaution Comments: pt. educated on TKA precautions and given exercise/educational sheet to reinforce Restrictions Weight Bearing Restrictions: Yes LLE Weight Bearing: Weight bearing as tolerated              ADL                                         General ADL Comments: I went over with pt and wife the most efficient way to get dressed so as to conserve energy. I also explained and demonstrated to them how to step into and out of shower stall to sit on the 3n1.Also spoke with them about how he might find it more comfortable to use his higher toilet at home than the 3n1 over the toilet and that he can try it here since ours is higher and he can use the grab bar (at home he has a sink on one side). I offered to have him practice any of what we had gone over, but he did not feel like he needed to.               Pertinent Vitals/Pain Pain Assessment: 0-10 Pain Score: 8  Pain Location: right knee Pain Descriptors / Indicators: Aching Pain Intervention(s): Patient requesting pain meds-RN notified     Hand Dominance Right   Extremity/Trunk Assessment Upper Extremity Assessment Upper Extremity Assessment: Overall WFL for tasks assessed     Communication Communication Communication: No difficulties   Cognition Arousal/Alertness: Awake/alert Behavior During Therapy: WFL for tasks  assessed/performed Overall Cognitive Status: Within Functional Limits for tasks assessed                                Home Living Family/patient expects to be discharged to:: Private residence Living Arrangements: Spouse/significant other;Children Available Help at Discharge: Available 24 hours/day (wife taking off from work for pt's recovery) Type of Home: House Home Access: Stairs to enter Secretary/administratorntrance Stairs-Number of Steps: 2 Entrance Stairs-Rails: None Home Layout: One level     Bathroom Shower/Tub: Walk-in shower;Door   Foot LockerBathroom Toilet: Standard     Home Equipment: Environmental consultantWalker - 2 wheels;Bedside commode;Other (comment) (CPM has been delivered)          Prior Functioning/Environment Level of Independence: Independent        Comments: work for [employer]    OT Diagnosis: Generalized weakness         OT Goals(Current goals can be found in the care plan section) Acute Rehab OT Goals Patient Stated Goal: home today or tomorrow  OT Frequency:                End of Session Nurse Communication: Patient requests pain meds  Activity Tolerance: Patient tolerated treatment well Patient left: with call bell/phone within reach;in bed;with family/visitor present  Time: 1610-9604: 0846-0856 OT Time Calculation (min): 10 min Charges:  OT General Charges $OT Visit: 1 Procedure OT Evaluation $Initial OT Evaluation Tier I: 1 Procedure  Jermaine GeorgesLeonard, Jermaine Stewart Stewart [phone] 12/18/2013, 10:26 AM

## 2013-12-18 NOTE — Progress Notes (Signed)
Physical Therapy Treatment Patient Details Name: Jermaine RosenthalJeffrey Kendrix MRN: [id number] DOB: Sep 26, 1960 Today's Date: 12/18/2013    History of Present Illness Pt. underwent L TKA for endstage arthritis    PT Comments    Pt. Mobilizing well this pm but has been unable to urinate on his own so far.  He had been sitting on 3 n 1 for about an hour per nursing tech.  I encouraged pt. To change positions at this time and he was agreeable for walk.  Pt. Wanted to return to recliner chair at end of walk.  Discussed with pt's Rn Cary.    Follow Up Recommendations  Home health PT     Equipment Recommendations  None recommended by PT;Other (comment) (equipment delivered to pt's home already)    Recommendations for Other Services       Precautions / Restrictions Precautions Precautions: Knee Restrictions Weight Bearing Restrictions: Yes LLE Weight Bearing: Weight bearing as tolerated    Mobility  Bed Mobility Overal bed mobility:  (not assessed)                Transfers Overall transfer level: Needs assistance Equipment used: Rolling walker (2 wheeled) Transfers: Sit to/from Stand Sit to Stand: Supervision         General transfer comment: supervision from 3 n1 and to recliner chair with vcs for appraoch to chair  Ambulation/Gait Ambulation/Gait assistance: Supervision Ambulation Distance (Feet): 350 Feet Assistive device: Rolling walker (2 wheeled) Gait Pattern/deviations: Step-through pattern Gait velocity: decreased but improving   General Gait Details: Pt.'s left knee more stiff this pm (as he had been sitting on 3 in 1 for about an hour trying to urinate per nursing tech).  Needed cues for left heel strike and to encourage increased knee extension duriing stance   Stairs            Wheelchair Mobility    Modified Rankin (Stroke Patients Only)       Balance                                    Cognition Arousal/Alertness:  Awake/alert Behavior During Therapy: WFL for tasks assessed/performed Overall Cognitive Status: Within Functional Limits for tasks assessed                      Exercises      General Comments        Pertinent Vitals/Pain Pain Assessment: 0-10 Pain Score: 4  Pain Location: left knee Pain Descriptors / Indicators: Aching (stiff from sitting on BSC ) Pain Intervention(s): Repositioned    Home Living                      Prior Function            PT Goals (current goals can now be found in the care plan section) Progress towards PT goals: Progressing toward goals    Frequency  7X/week    PT Plan Current plan remains appropriate    Co-evaluation             End of Session   Activity Tolerance: Patient tolerated treatment well Patient left: in chair;with call bell/phone within reach     Time: 1540-1550 PT Time Calculation (min): 10 min  Charges:  $Gait Training: 8-22 mins  G CodesFerman Hamming:      Lindsay Straka B 12/18/2013, 4:05 PM Weldon PickingSusan Robt Okuda PT Acute Rehab Services [phone] Beeper [phone]

## 2013-12-18 NOTE — Clinical Social Work Note (Signed)
Clinical Social Worker received referral for possible ST-SNF placement.  Chart reviewed.  PT/OT pending, however per MD note, patient should return home with home health and equipment.  Spoke with RN Case Manager who will follow up with patient to discuss home health needs if deemed appropriate.  CSW signing off - please re consult if social work needs arise.  Macario GoldsJesse Alzina Golda, [place] [phone]

## 2013-12-18 NOTE — Progress Notes (Signed)
Patient ID: Jermaine RosenthalJeffrey Stewart, male   DOB: September 22, 1960, 53 y.o.   MRN: [id number] PATIENT ID: Jermaine RosenthalJeffrey Stewart  MRN: [id number]  DOB/AGE:  September 22, 1960 / 53 y.o.  1 Day Post-Op Procedure(s) (LRB): TOTAL KNEE ARTHROPLASTY (Left)    PROGRESS NOTE Subjective: Patient is alert, oriented, x1 Nausea, x1 Vomiting, yes passing gas, no Bowel Movement. Taking PO sips. Denies SOB, Chest or Calf Pain. Using Incentive Spirometer, PAS in place. Ambulate WBAT, CPM 0-40 Patient reports pain as 4 on 0-10 scale  .    Objective: Vital signs in last 24 hours: Filed Vitals:   12/17/13 1735 12/17/13 2022 12/18/13 0152 12/18/13 0522  BP: 171/97 161/97 147/89 144/82  Pulse: 119 109 107 110  Temp: 98.6 F (37 C) 98.6 F (37 C) 97.8 F (36.6 C) 98.4 F (36.9 C)  TempSrc: Oral     Resp: 18 18 18 18   Height:      Weight:      SpO2: 95% 99% 95% 98%      Intake/Output from previous day: I/O last 3 completed shifts: In: 300 [I.V.:300] Out: 425 [Drains:375; Blood:50]   Intake/Output this shift:     LABORATORY DATA:  Recent Labs  12/18/13 0530  WBC 8.0  HGB 11.8*  HCT 33.9*  PLT 240  NA 132*  K 3.5*  CL 91*  CO2 27  BUN 7  CREATININE 0.70  GLUCOSE 120*  CALCIUM 8.9    Examination: Neurologically intact ABD soft Neurovascular intact Sensation intact distally Intact pulses distally Dorsiflexion/Plantar flexion intact Incision: no drainage No cellulitis present Compartment soft} Blood and plasma separated in drain indicating minimal recent drainage, drain pulled without difficulty.  Assessment:   1 Day Post-Op Procedure(s) (LRB): TOTAL KNEE ARTHROPLASTY (Left) ADDITIONAL DIAGNOSIS: Expected Acute Blood Loss Anemia.  Plan: PT/OT WBAT, CPM 5/hrs day until ROM 0-90 degrees, then D/C CPM DVT Prophylaxis:  SCDx72hrs, ASA 325 mg BID x 2 weeks DISCHARGE PLAN: Home, when patient meets all PT goals DISCHARGE NEEDS: HHPT, HHRN, CPM, Walker and 3-in-1 comode seat     Jermaine Stewart  J 12/18/2013, 7:07 AM

## 2013-12-18 NOTE — Progress Notes (Signed)
Utilization review completed.  

## 2013-12-19 ENCOUNTER — Encounter (HOSPITAL_COMMUNITY): Payer: Self-pay | Admitting: Orthopedic Surgery

## 2013-12-19 LAB — CBC
HEMATOCRIT: 30.1 % — AB (ref 39.0–52.0)
Hemoglobin: 10.5 g/dL — ABNORMAL LOW (ref 13.0–17.0)
MCH: 32.8 pg (ref 26.0–34.0)
MCHC: 34.9 g/dL (ref 30.0–36.0)
MCV: 94.1 fL (ref 78.0–100.0)
Platelets: 174 10*3/uL (ref 150–400)
RBC: 3.2 MIL/uL — ABNORMAL LOW (ref 4.22–5.81)
RDW: 12.4 % (ref 11.5–15.5)
WBC: 6.3 10*3/uL (ref 4.0–10.5)

## 2013-12-19 NOTE — Discharge Summary (Signed)
Patient ID: Jermaine Stewart MRN: [id number] DOB/AGE: 09/06/1960 53 y.o.  Admit date: 12/17/2013 Discharge date: 12/19/2013  Admission Diagnoses:  Active Problems:   Arthritis of knee   Discharge Diagnoses:  Same  Past Medical History  Diagnosis Date  . Hyperlipidemia   . Unspecified vitamin D deficiency   . Hypertension   . Arthritis     Surgeries: Procedure(s): TOTAL KNEE ARTHROPLASTY on 12/17/2013   Consultants:    Discharged Condition: Improved  Hospital Course: Jermaine Stewart is an 53 y.o. male who was admitted 12/17/2013 for operative treatment of<principal problem not specified>. Patient has severe unremitting pain that affects sleep, daily activities, and work/hobbies. After pre-op clearance the patient was taken to the operating room on 12/17/2013 and underwent  Procedure(s): TOTAL KNEE ARTHROPLASTY.    Patient was given perioperative antibiotics: Anti-infectives   Start     Dose/Rate Route Frequency Ordered Stop   12/17/13 1410  cefUROXime (ZINACEF) injection  Status:  Discontinued       As needed 12/17/13 1410 12/17/13 1528   12/17/13 0600  ceFAZolin (ANCEF) IVPB 2 g/50 mL premix     2 g 100 mL/hr over 30 Minutes Intravenous On call to O.R. 12/16/13 1436 12/17/13 1350       Patient was given sequential compression devices, early ambulation, and chemoprophylaxis to prevent DVT.  Patient benefited maximally from hospital stay and there were no complications.    Recent vital signs: Patient Vitals for the past 24 hrs:  BP Temp Temp src Pulse Resp SpO2  12/19/13 0558 139/74 mmHg 98.4 F (36.9 C) Oral 111 16 99 %  12/19/13 0000 - - - - 18 100 %  12/18/13 2100 135/74 mmHg 99 F (37.2 C) Oral 109 18 100 %  12/18/13 1600 - - - - 18 -  12/18/13 1306 150/83 mmHg 99.9 F (37.7 C) Oral 101 18 100 %  12/18/13 1200 - - - - 18 -  12/18/13 0800 - - - - 18 -     Recent laboratory studies:  Recent Labs  12/18/13 0530  WBC 8.0  HGB 11.8*  HCT 33.9*  PLT  240  NA 132*  K 3.5*  CL 91*  CO2 27  BUN 7  CREATININE 0.70  GLUCOSE 120*  CALCIUM 8.9     Discharge Medications:     Medication List    STOP taking these medications       aspirin 325 MG tablet  Replaced by:  aspirin EC 325 MG tablet     Diclofenac Sodium CR 100 MG 24 hr tablet      TAKE these medications       aspirin EC 325 MG tablet  Take 1 tablet (325 mg total) by mouth 2 (two) times daily.     lisinopril-hydrochlorothiazide 20-25 MG per tablet  Commonly known as:  PRINZIDE,ZESTORETIC  TAKE 1 TABLET DAILY FOR BLOOD PRESSURE     methocarbamol 500 MG tablet  Commonly known as:  ROBAXIN  Take 1 tablet (500 mg total) by mouth 2 (two) times daily with a meal.     oxyCODONE-acetaminophen 5-325 MG per tablet  Commonly known as:  ROXICET  Take 1 tablet by mouth every 4 (four) hours as needed.     POTASSIUM PO  Take 1 tablet by mouth daily.     Vitamin D-3 5000 UNITS Tabs  Take 15,000 Units by mouth daily. 2-3 PO QD        Diagnostic Studies: Dg Chest 2 View  12/08/2013   CLINICAL DATA:  Preoperative for total knee arthroplasty. Hypertension.  EXAM: CHEST  2 VIEW  COMPARISON:  None.  FINDINGS: The heart size and mediastinal contours are within normal limits. There is no focal infiltrate, pulmonary edema, or pleural effusion. There is scoliosis of spine. The visualized skeletal structures are otherwise unremarkable.  IMPRESSION: No active cardiopulmonary disease.   Electronically Signed   By: Sherian ReinWei-Chen  Lin M.D.   On: 12/08/2013 11:54    Disposition: Final discharge disposition not confirmed      Discharge Instructions   CPM    Complete by:  As directed   Continuous passive motion machine (CPM):      Use the CPM from 0 to 60  for 5 hours per day.      You may increase by 10 degrees per day.  You may break it up into 2 or 3 sessions per day.      Use CPM for 2 weeks or until you are told to stop.     Call MD / Call 911    Complete by:  As directed   If you  experience chest pain or shortness of breath, CALL 911 and be transported to the hospital emergency room.  If you develope a fever above 101 F, pus (white drainage) or increased drainage or redness at the wound, or calf pain, call your surgeon's office.     Change dressing    Complete by:  As directed   Change dressing on 5, then change the dressing daily with sterile 4 x 4 inch gauze dressing and apply TED hose.  You may clean the incision with alcohol prior to redressing.     Constipation Prevention    Complete by:  As directed   Drink plenty of fluids.  Prune juice may be helpful.  You may use a stool softener, such as Colace (over the counter) 100 mg twice a day.  Use MiraLax (over the counter) for constipation as needed.     Diet - low sodium heart healthy    Complete by:  As directed      Discharge instructions    Complete by:  As directed   FOLLOW UP IN OFFICE WITH DR. Turner DanielsOWAN IN 2 WEEKS     Driving restrictions    Complete by:  As directed   No driving for 2 weeks     Increase activity slowly as tolerated    Complete by:  As directed      Patient may shower    Complete by:  As directed   You may shower without a dressing once there is no drainage.  Do not wash over the wound.  If drainage remains, cover wound with plastic wrap and then shower.           Follow-up Information   Follow up with Nestor LewandowskyOWAN,FRANK J, MD In 2 weeks.   Specialty:  Orthopedic Surgery   Contact information:   [address] [place] [address] [phone]        Signed: Vear ClockHILLIPS, Emre Stock R 12/19/2013, 7:24 AM

## 2013-12-19 NOTE — Progress Notes (Signed)
AVS discharge was reviewed with patient. Patient was giving prescription for robaxin, aspirin, and percocet to take to his pharmacy. Patient stated that he did not have any questions. Patient was able to void on own approximately 500cc of urine before being discharge. Volunteer came assisted patient to his transportation.

## 2013-12-19 NOTE — Progress Notes (Signed)
Physical Therapy Treatment Patient Details Name: Jermaine Stewart MRN: [id number] DOB: 12/18/1960 Today's Date: 12/19/2013    History of Present Illness Pt. underwent L TKA for endstage arthritis    PT Comments    Excellent progress this am with mobility and exercises.  He has just had foley removed and DC is anticipated today as long as he can urinate.  He is ready for DC from PT standpoint and when medically ready. Having more pain with exercises , as anticipated.  Follow Up Recommendations  Home health PT     Equipment Recommendations  None recommended by PT;Other (comment) (equipment already in home)    Recommendations for Other Services       Precautions / Restrictions Precautions Precautions: Knee Precaution Comments: reviewed knee precautions/positioning and knee exercises; checked appropriate exercises on his handout Restrictions Weight Bearing Restrictions: Yes LLE Weight Bearing: Weight bearing as tolerated    Mobility  Bed Mobility Overal bed mobility: Modified Independent Bed Mobility: Supine to Sit     Supine to sit: Modified independent (Device/Increase time)     General bed mobility comments: needs increased time but managing own his own  Transfers Overall transfer level: Modified independent Equipment used: Rolling walker (2 wheeled) Transfers: Sit to/from Stand Sit to Stand: Modified independent (Device/Increase time)         General transfer comment: one verbal reminder needed to push from bed and to reach back for recliner before sitting; physically managing transitions on his own  Ambulation/Gait Ambulation/Gait assistance: Modified independent (Device/Increase time) Ambulation Distance (Feet): 125 Feet Assistive device: Rolling walker (2 wheeled) Gait Pattern/deviations: Step-through pattern Gait velocity: decreased but improving   General Gait Details: Pt. encouraged to continue to emphasize heel strike and foot flat phase for  improved gait pattern; pt. with no overt LOB and mobilizing at mod I level   Stairs Stairs: Yes Stairs assistance: Supervision Stair Management: No rails;Backwards Number of Stairs: 1 (two trials) General stair comments: Pt. knew sequence from attending pre op class; good technique and no physical support needed  Wheelchair Mobility    Modified Rankin (Stroke Patients Only)       Balance                                    Cognition Arousal/Alertness: Awake/alert Behavior During Therapy: WFL for tasks assessed/performed Overall Cognitive Status: Within Functional Limits for tasks assessed                      Exercises Total Joint Exercises Ankle Circles/Pumps: AROM;Both;20 reps Quad Sets: AROM;Both;10 reps;Supine Short Arc Quad: AAROM;Left;10 reps;Supine Hip ABduction/ADduction: AROM;Left;10 reps;Supine Straight Leg Raises: AAROM;Left;10 reps;Supine Knee Flexion: AROM;Left;5 reps;Seated Goniometric ROM: -5 to 58    General Comments        Pertinent Vitals/Pain Pain Assessment: 0-10 Pain Score: 2 pain increased to 4/10 with exercises Pain Location: left knee Pain Descriptors / Indicators: Aching;Sore Pain Intervention(s): Monitored during session;Premedicated before session;Relaxation;Repositioned    Home Living                      Prior Function            PT Goals (current goals can now be found in the care plan section) Progress towards PT goals: Progressing toward goals    Frequency  7X/week    PT Plan Current plan remains appropriate    Co-evaluation  End of Session   Activity Tolerance: Patient tolerated treatment well Patient left: in chair;with call bell/phone within reach     Time: 0804-0839 PT Time Calculation (min): 35 min  Charges:  $Gait Training: 8-22 mins $Therapeutic Exercise: 8-22 mins                    G Codes:      Ferman HammingBlankenship, Lenor Provencher B 12/19/2013, 8:50 AM Weldon PickingSusan  [facility] [phone] Bee[place] [phone]

## 2013-12-19 NOTE — Progress Notes (Signed)
PATIENT ID: Jermaine Stewart  MRN: [id number]  DOB/AGE:  Jul 14, 1960 / 53 y.o.  2 Days Post-Op Procedure(s) (LRB): TOTAL KNEE ARTHROPLASTY (Left)    PROGRESS NOTE Subjective: Patient is alert, oriented, no Nausea, no Vomiting, yes passing gas, no Bowel Movement. Taking PO sips. Denies SOB, Chest or Calf Pain. Using Incentive Spirometer, PAS in place. Ambulate WBAT, CPM 0-60 Patient reports pain as mild  .    Objective: Vital signs in last 24 hours: Filed Vitals:   12/18/13 1600 12/18/13 2100 12/19/13 0000 12/19/13 0558  BP:  135/74  139/74  Pulse:  109  111  Temp:  99 F (37.2 C)  98.4 F (36.9 C)  TempSrc:  Oral  Oral  Resp: 18 18 18 16   Height:      Weight:      SpO2:  100% 100% 99%      Intake/Output from previous day: I/O last 3 completed shifts: In: 1740 [P.O.:240; I.V.:1500] Out: 7500 [Urine:7350; Drains:150]   Intake/Output this shift:     LABORATORY DATA:  Recent Labs  12/18/13 0530  WBC 8.0  HGB 11.8*  HCT 33.9*  PLT 240  NA 132*  K 3.5*  CL 91*  CO2 27  BUN 7  CREATININE 0.70  GLUCOSE 120*  CALCIUM 8.9    Examination: Neurologically intact Neurovascular intact Sensation intact distally Intact pulses distally Dorsiflexion/Plantar flexion intact Incision: dressing C/D/I No cellulitis present Compartment soft}  Assessment:   2 Days Post-Op Procedure(s) (LRB): TOTAL KNEE ARTHROPLASTY (Left) ADDITIONAL DIAGNOSIS: Expected Acute Blood Loss Anemia, Urinary Retention Acute urinary retention -  Plan to pull foley this Am.  Pt may have a dose of flomax to help him urinate on his own  Plan: PT/OT WBAT, CPM 5/hrs day until ROM 0-90 degrees, then D/C CPM DVT Prophylaxis:  SCDx72hrs, ASA 325 mg BID x 2 weeks DISCHARGE PLAN: Home, once pt is able to urinate on his own DISCHARGE NEEDS: HHPT, HHRN, CPM, Walker and 3-in-1 comode seat     Inas Avena R 12/19/2013, 7:19 AM

## 2014-02-04 ENCOUNTER — Encounter: Payer: Self-pay | Admitting: Internal Medicine

## 2014-02-04 ENCOUNTER — Ambulatory Visit (INDEPENDENT_AMBULATORY_CARE_PROVIDER_SITE_OTHER): Payer: Commercial Managed Care - PPO | Admitting: Physician Assistant

## 2014-02-04 VITALS — BP 118/78 | HR 88 | Temp 98.2°F | Resp 16 | Ht 69.5 in | Wt 160.0 lb

## 2014-02-04 DIAGNOSIS — R350 Frequency of micturition: Secondary | ICD-10-CM

## 2014-02-04 DIAGNOSIS — Z1211 Encounter for screening for malignant neoplasm of colon: Secondary | ICD-10-CM

## 2014-02-04 DIAGNOSIS — E785 Hyperlipidemia, unspecified: Secondary | ICD-10-CM

## 2014-02-04 DIAGNOSIS — R7303 Prediabetes: Secondary | ICD-10-CM

## 2014-02-04 DIAGNOSIS — I10 Essential (primary) hypertension: Secondary | ICD-10-CM

## 2014-02-04 DIAGNOSIS — Z79899 Other long term (current) drug therapy: Secondary | ICD-10-CM

## 2014-02-04 DIAGNOSIS — R7309 Other abnormal glucose: Secondary | ICD-10-CM

## 2014-02-04 DIAGNOSIS — E559 Vitamin D deficiency, unspecified: Secondary | ICD-10-CM

## 2014-02-04 DIAGNOSIS — D62 Acute posthemorrhagic anemia: Secondary | ICD-10-CM

## 2014-02-04 NOTE — Progress Notes (Signed)
Assessment and Plan:  Hypertension: Continue medication, monitor blood pressure at home. Continue DASH diet.  Reminder to go to the ER if any CP, SOB, nausea, dizziness, severe HA, changes vision/speech, left arm numbness and tingling, and jaw pain. Cholesterol: Continue diet and exercise. Check cholesterol.  Pre-diabetes-Continue diet and exercise. Check A1C Vitamin D Def- check level and continue medications.  Urinary frequency- ? UTI versus prostatitis versus OAB- will get flomax before procedure, check for infection since recent catheter.  Anemia- likely due to blood loss- check labs.  Schedule screening colonoscopy  Continue diet and meds as discussed. Further disposition pending results of labs.  HPI 53 y.o. male  presents for 3 month follow up with hypertension, hyperlipidemia, prediabetes and vitamin D. His blood pressure has been controlled at home, today their BP is BP: 118/78 mmHg He does workout daily. He denies chest pain, shortness of breath, dizziness.  He is not on cholesterol medication and denies myalgias. His cholesterol is at goal. The cholesterol last visit was:   Lab Results  Component Value Date   CHOL 211* 07/22/2013   HDL 49 07/22/2013   LDLCALC 118* 07/22/2013   TRIG 218* 07/22/2013   CHOLHDL 4.3 07/22/2013    Last A1C in the office was:  Lab Results  Component Value Date   HGBA1C 5.3 07/22/2013   Patient is on Vitamin D supplement.   Lab Results  Component Value Date   VD25OH 65 07/22/2013     Left knee replacement with Dr. Elita Quickowen, states he is doing well, no SOB, CP and planning on having the other done in 4 weeks. He did have some anemia after the replacement.Has had increased urinary frequency since catheter placed, nocturia x1-2. Denies BPH symptoms.   Lab Results  Component Value Date   WBC 6.3 12/19/2013   HGB 10.5* 12/19/2013   HCT 30.1* 12/19/2013   MCV 94.1 12/19/2013   PLT 174 12/19/2013    Current Medications:  Current Outpatient  Prescriptions on File Prior to Visit  Medication Sig Dispense Refill  . aspirin EC 325 MG tablet Take 1 tablet (325 mg total) by mouth 2 (two) times daily. 30 tablet 0  . Cholecalciferol (VITAMIN D-3) 5000 UNITS TABS Take 15,000 Units by mouth daily. 2-3 PO QD    . lisinopril-hydrochlorothiazide (PRINZIDE,ZESTORETIC) 20-25 MG per tablet TAKE 1 TABLET DAILY FOR BLOOD PRESSURE 90 tablet 3  . methocarbamol (ROBAXIN) 500 MG tablet Take 1 tablet (500 mg total) by mouth 2 (two) times daily with a meal. 60 tablet 0  . oxyCODONE-acetaminophen (ROXICET) 5-325 MG per tablet Take 1 tablet by mouth every 4 (four) hours as needed. 60 tablet 0  . POTASSIUM PO Take 1 tablet by mouth daily.     No current facility-administered medications on file prior to visit.   Medical History:  Past Medical History  Diagnosis Date  . Hyperlipidemia   . Unspecified vitamin D deficiency   . Hypertension   . Arthritis    Allergies: No Known Allergies   Review of Systems:  Review of Systems  Constitutional: Negative.   HENT: Negative.   Respiratory: Negative.  Negative for cough, shortness of breath and wheezing.   Cardiovascular: Negative.  Negative for chest pain and palpitations.  Gastrointestinal: Positive for diarrhea (with red meat, never had colonoscopy). Negative for heartburn, nausea, vomiting, abdominal pain, constipation, blood in stool and melena.  Genitourinary: Positive for frequency. Negative for urgency, hematuria and flank pain.  Musculoskeletal: Negative for myalgias, back pain, joint pain,  falls and neck pain.       Still some swelling of left knee but now pain, healing well.  Skin: Negative.   Neurological: Negative.   Endo/Heme/Allergies: Negative.   Psychiatric/Behavioral: Negative.     Family history- Review and unchanged Social history- Review and unchanged Physical Exam: BP 118/78 mmHg  Pulse 88  Temp(Src) 98.2 F (36.8 C)  Resp 16  Ht 5' 9.5" (1.765 m)  Wt 160 lb (72.576 kg)   BMI 23.30 kg/m2 Wt Readings from Last 3 Encounters:  02/04/14 160 lb (72.576 kg)  12/17/13 158 lb (71.668 kg)  12/08/13 158 lb 12.8 oz (72.031 kg)   General Appearance: Well nourished, in no apparent distress. Eyes: PERRLA, EOMs, conjunctiva no swelling or erythema Sinuses: No Frontal/maxillary tenderness ENT/Mouth: Ext aud canals clear, TMs without erythema, bulging. No erythema, swelling, or exudate on post pharynx.  Tonsils not swollen or erythematous. Hearing normal.  Neck: Supple, thyroid normal.  Respiratory: Respiratory effort normal, BS equal bilaterally without rales, rhonchi, wheezing or stridor.  Cardio: RRR with no MRGs. Brisk peripheral pulses without edema.  Abdomen: Soft, + BS.  Non tender, no guarding, rebound, hernias, masses. Lymphatics: Non tender without lymphadenopathy.  Musculoskeletal: Full ROM, 5/5 strength, normal gait. Well healing vertical scar on left knee with mild swelling distal, no erythema, warmth, signs of infection. Skin: Warm, dry without rashes, lesions, ecchymosis.  Neuro: Cranial nerves intact. Normal muscle tone, no cerebellar symptoms. Sensation intact.  Psych: Awake and oriented X 3, normal affect, Insight and Judgment appropriate.    Quentin Mullingollier, Sisto Granillo, PA-C 5:12 PM [facility]

## 2014-02-04 NOTE — Patient Instructions (Addendum)
Colon cancer is 3rd most diagnosed cancer and 2nd leading cause of death in both men and women 51 years of age and older despite being one of the most preventable and treatable cancers if found early. We will set you up for colonoscopy.   Benign Prostatic Hypertrophy The prostate gland is part of the reproductive system of men. A normal prostate is about the size and shape of a walnut. The prostate gland produces a fluid that is mixed with sperm to make semen. This gland surrounds the urethra and is located in front of the rectum and just below the bladder. The bladder is where urine is stored. The urethra is the tube through which urine passes from the bladder to get out of the body. The prostate grows as a man ages. An enlarged prostate not caused by cancer is called benign prostatic hypertrophy (BPH). An enlarged prostate can press on the urethra. This can make it harder to pass urine. In the early stages of enlargement, the bladder can get by with a narrowed urethra by forcing the urine through. If the problem gets worse, medical or surgical treatment may be required.  This condition should be followed by your health care provider. The accumulation of urine in the bladder can cause infection. Back pressure and infection can progress to bladder damage and kidney (renal) failure. If needed, your health care provider may refer you to a specialist in kidney and prostate disease (urologist). CAUSES  BPH is a common health problem in men older than 50 years. This condition is a normal part of aging. However, not all men will develop problems from this condition. If the enlargement grows away from the urethra, then there will not be any compression of the urethra and resistance to urine flow.If the growth is toward the urethra and compresses it, you will experience difficulty urinating.  SYMPTOMS   Not able to completely empty your bladder.  Getting up often during the night to urinate.  Need to  urinate frequently during the day.  Difficultly starting urine flow.  Decrease in size and strength of your urine stream.  Dribbling after urination.  Pain on urination (more common with infection).  Inability to pass urine. This needs immediate treatment.  The development of a urinary tract infection. DIAGNOSIS  These tests will help your health care provider understand your problem:  A thorough history and physical examination.  A urination history, with the number of times you urinate, the amounts of urine, the strength of the urine stream, and the feeling of emptiness or fullness after urinating.  A postvoid bladder scan that measures any amount of urine that may remain in your bladder after you finish urinating.  Digital rectal exam. In a rectal exam, your health care provider checks your prostate by putting a gloved, lubricated finger into your rectum to feel the back of your prostate gland. This exam detects the size of your gland and abnormal lumps or growths.  Exam of your urine (urinalysis).  Prostate specific antigen (PSA) screening. This is a blood test used to screen for prostate cancer.  Rectal ultrasonography. This test uses sound waves to electronically produce a picture of your prostate gland. TREATMENT  Once symptoms begin, your health care provider will monitor your condition. Of the men with this condition, one third will have symptoms that stabilize, one third will have symptoms that improve, and one third will have symptoms that progress in the first year. Mild symptoms may not need treatment. Simple observation  and yearly exams may be all that is required. Medicines and surgery are options for more severe problems. Your health care provider can help you make an informed decision for what is best. Two classes of medicines are available for relief of prostate symptoms:  Medicines that shrink the prostate. This helps relieve symptoms. These medicines take time to  work, and it may be months before any improvement is seen.  Uncommon side effects include problems with sexual function.  Medicines to relax the muscle of the prostate. This also relieves the obstruction by reducing any compression on the urethra.This group of medicines work much faster than those that reduce the size of the prostate gland. Usually, one can experience improvement in days to weeks..  Side effects can include dizziness, fatigue, lightheadedness, and retrograde ejaculation (diminished volume of ejaculate). Several types of surgical treatments are available for relief of prostate symptoms:  Transurethral resection of the prostate (TURP)--In this treatment, an instrument is inserted through opening at the tip of the penis. It is used to cut away pieces of the inner core of the prostate. The pieces are removed through the same opening of the penis. This removes the obstruction and helps get rid of the symptoms.  Transurethral incision (TUIP)--In this procedure, small cuts are made in the prostate. This lessens the prostates pressure on the urethra.  Transurethral microwave thermotherapy (TUMT)--This procedure uses microwaves to create heat. The heat destroys and removes a small amount of prostate tissue.  Transurethral needle ablation (TUNA)--This is a procedure that uses radio frequencies to do the same as TUMT.  Interstitial laser coagulation (ILC)--This is a procedure that uses a laser to do the same as TUMT and TUNA.  Transurethral electrovaporization (TUVP)--This is a procedure that uses electrodes to do the same as the procedures listed above. SEEK MEDICAL CARE IF:   You develop a fever.  There is unexplained back pain.  Symptoms are not helped by medicines prescribed.  You develop side effects from the medicine you are taking.  Your urine becomes very dark or has a bad smell.  Your lower abdomen becomes distended and you have difficulty passing your urine. SEEK  IMMEDIATE MEDICAL CARE IF:   You are suddenly unable to urinate. This is an emergency. You should be seen immediately.  There are large amounts of blood or clots in the urine.  Your urinary problems become unmanageable.  You develop lightheadedness, severe dizziness, or you feel faint.  You develop moderate to severe low back or flank pain.  You develop chills or fever. Document Released: 02/06/2005 Document Revised: 02/11/2013 Document Reviewed: 08/22/2012 [facility] Patient Information 2015 [employer] [place]. This information is not intended to replace advice given to you by your health care provider. Make sure you discuss any questions you have with your health care provider.

## 2014-02-05 LAB — URINALYSIS, ROUTINE W REFLEX MICROSCOPIC
Bilirubin Urine: NEGATIVE
Glucose, UA: NEGATIVE mg/dL
HGB URINE DIPSTICK: NEGATIVE
KETONES UR: NEGATIVE mg/dL
Leukocytes, UA: NEGATIVE
NITRITE: NEGATIVE
PH: 6.5 (ref 5.0–8.0)
PROTEIN: NEGATIVE mg/dL
Specific Gravity, Urine: 1.016 (ref 1.005–1.030)
UROBILINOGEN UA: 0.2 mg/dL (ref 0.0–1.0)

## 2014-02-05 LAB — CBC WITH DIFFERENTIAL/PLATELET
Basophils Absolute: 0.1 10*3/uL (ref 0.0–0.1)
Basophils Relative: 1 % (ref 0–1)
Eosinophils Absolute: 0.2 10*3/uL (ref 0.0–0.7)
Eosinophils Relative: 3 % (ref 0–5)
HEMATOCRIT: 38.6 % — AB (ref 39.0–52.0)
HEMOGLOBIN: 13.2 g/dL (ref 13.0–17.0)
LYMPHS ABS: 1.7 10*3/uL (ref 0.7–4.0)
Lymphocytes Relative: 23 % (ref 12–46)
MCH: 31.7 pg (ref 26.0–34.0)
MCHC: 34.2 g/dL (ref 30.0–36.0)
MCV: 92.6 fL (ref 78.0–100.0)
MPV: 9.9 fL (ref 9.4–12.4)
Monocytes Absolute: 0.7 10*3/uL (ref 0.1–1.0)
Monocytes Relative: 10 % (ref 3–12)
NEUTROS ABS: 4.6 10*3/uL (ref 1.7–7.7)
NEUTROS PCT: 63 % (ref 43–77)
Platelets: 313 10*3/uL (ref 150–400)
RBC: 4.17 MIL/uL — ABNORMAL LOW (ref 4.22–5.81)
RDW: 13.3 % (ref 11.5–15.5)
WBC: 7.3 10*3/uL (ref 4.0–10.5)

## 2014-02-05 LAB — BASIC METABOLIC PANEL WITH GFR
BUN: 19 mg/dL (ref 6–23)
CO2: 24 meq/L (ref 19–32)
Calcium: 9.8 mg/dL (ref 8.4–10.5)
Chloride: 97 mEq/L (ref 96–112)
Creat: 1.02 mg/dL (ref 0.50–1.35)
GFR, Est African American: >89 mL/min
GFR, Est Non African American: 85 mL/min
Glucose, Bld: 94 mg/dL (ref 70–99)
POTASSIUM: 4 meq/L (ref 3.5–5.3)
SODIUM: 134 meq/L — AB (ref 135–145)

## 2014-02-05 LAB — LIPID PANEL
Cholesterol: 243 mg/dL — ABNORMAL HIGH (ref 0–200)
HDL: 67 mg/dL (ref >39)
LDL CALC: 104 mg/dL — AB (ref 0–99)
Total CHOL/HDL Ratio: 3.6 Ratio
Triglycerides: 358 mg/dL — ABNORMAL HIGH (ref <150)
VLDL: 72 mg/dL — AB (ref 0–40)

## 2014-02-05 LAB — HEPATIC FUNCTION PANEL
ALK PHOS: 58 U/L (ref 39–117)
ALT: 23 U/L (ref 0–53)
AST: 21 U/L (ref 0–37)
Albumin: 4.6 g/dL (ref 3.5–5.2)
BILIRUBIN DIRECT: 0.1 mg/dL (ref 0.0–0.3)
Indirect Bilirubin: 0.4 mg/dL (ref 0.2–1.2)
TOTAL PROTEIN: 7.3 g/dL (ref 6.0–8.3)
Total Bilirubin: 0.5 mg/dL (ref 0.2–1.2)

## 2014-02-05 LAB — IRON AND TIBC
%SAT: 19 % — AB (ref 20–55)
Iron: 71 ug/dL (ref 42–165)
TIBC: 367 ug/dL (ref 215–435)
UIBC: 296 ug/dL (ref 125–400)

## 2014-02-05 LAB — MAGNESIUM: MAGNESIUM: 2.1 mg/dL (ref 1.5–2.5)

## 2014-02-05 LAB — VITAMIN D 25 HYDROXY (VIT D DEFICIENCY, FRACTURES): VIT D 25 HYDROXY: 36 ng/mL (ref 30–100)

## 2014-02-05 LAB — FERRITIN: FERRITIN: 95 ng/mL (ref 22–322)

## 2014-02-05 LAB — TSH: TSH: 1.151 u[IU]/mL (ref 0.350–4.500)

## 2014-02-06 LAB — URINE CULTURE
Colony Count: NO GROWTH
Organism ID, Bacteria: NO GROWTH

## 2014-02-10 ENCOUNTER — Encounter: Payer: Self-pay | Admitting: Internal Medicine

## 2014-02-16 ENCOUNTER — Other Ambulatory Visit: Payer: Self-pay | Admitting: Orthopedic Surgery

## 2014-02-23 ENCOUNTER — Encounter (HOSPITAL_COMMUNITY)
Admission: RE | Admit: 2014-02-23 | Discharge: 2014-02-23 | Disposition: A | Discharge status: Home / Self Care | Payer: Commercial Managed Care - PPO | Source: Ambulatory Visit | Attending: Orthopedic Surgery | Admitting: Orthopedic Surgery

## 2014-02-23 ENCOUNTER — Encounter (HOSPITAL_COMMUNITY): Payer: Self-pay

## 2014-02-23 ENCOUNTER — Other Ambulatory Visit (HOSPITAL_COMMUNITY): Payer: Self-pay | Admitting: *Deleted

## 2014-02-23 DIAGNOSIS — Z01812 Encounter for preprocedural laboratory examination: Secondary | ICD-10-CM | POA: Diagnosis present

## 2014-02-23 DIAGNOSIS — M1711 Unilateral primary osteoarthritis, right knee: Secondary | ICD-10-CM | POA: Insufficient documentation

## 2014-02-23 LAB — CBC WITH DIFFERENTIAL/PLATELET
BASOS PCT: 1 % (ref 0–1)
Basophils Absolute: 0 10*3/uL (ref 0.0–0.1)
EOS PCT: 5 % (ref 0–5)
Eosinophils Absolute: 0.2 10*3/uL (ref 0.0–0.7)
HEMATOCRIT: 42.2 % (ref 39.0–52.0)
HEMOGLOBIN: 14.5 g/dL (ref 13.0–17.0)
Lymphocytes Relative: 23 % (ref 12–46)
Lymphs Abs: 1.1 10*3/uL (ref 0.7–4.0)
MCH: 31 pg (ref 26.0–34.0)
MCHC: 34.4 g/dL (ref 30.0–36.0)
MCV: 90.2 fL (ref 78.0–100.0)
MONO ABS: 0.9 10*3/uL (ref 0.1–1.0)
MONOS PCT: 18 % — AB (ref 3–12)
NEUTROS ABS: 2.6 10*3/uL (ref 1.7–7.7)
Neutrophils Relative %: 53 % (ref 43–77)
Platelets: 255 10*3/uL (ref 150–400)
RBC: 4.68 MIL/uL (ref 4.22–5.81)
RDW: 12.4 % (ref 11.5–15.5)
WBC: 4.9 10*3/uL (ref 4.0–10.5)

## 2014-02-23 LAB — COMPREHENSIVE METABOLIC PANEL
ALT: 42 U/L (ref 0–53)
ANION GAP: 13 (ref 5–15)
AST: 39 U/L — ABNORMAL HIGH (ref 0–37)
Albumin: 4.7 g/dL (ref 3.5–5.2)
Alkaline Phosphatase: 84 U/L (ref 39–117)
BILIRUBIN TOTAL: 0.4 mg/dL (ref 0.3–1.2)
BUN: 12 mg/dL (ref 6–23)
CO2: 26 mmol/L (ref 19–32)
CREATININE: 1.12 mg/dL (ref 0.50–1.35)
Calcium: 9.7 mg/dL (ref 8.4–10.5)
Chloride: 93 mEq/L — ABNORMAL LOW (ref 96–112)
GFR calc non Af Amer: 74 mL/min — ABNORMAL LOW (ref >90)
GFR, EST AFRICAN AMERICAN: 86 mL/min — AB (ref >90)
Glucose, Bld: 98 mg/dL (ref 70–99)
POTASSIUM: 3.8 mmol/L (ref 3.5–5.1)
Sodium: 132 mmol/L — ABNORMAL LOW (ref 135–145)
TOTAL PROTEIN: 7.5 g/dL (ref 6.0–8.3)

## 2014-02-23 LAB — TYPE AND SCREEN
ABO/RH(D): A POS
Antibody Screen: NEGATIVE

## 2014-02-23 LAB — URINALYSIS, ROUTINE W REFLEX MICROSCOPIC
Bilirubin Urine: NEGATIVE
GLUCOSE, UA: NEGATIVE mg/dL
HGB URINE DIPSTICK: NEGATIVE
KETONES UR: NEGATIVE mg/dL
Leukocytes, UA: NEGATIVE
Nitrite: NEGATIVE
PH: 6 (ref 5.0–8.0)
Protein, ur: NEGATIVE mg/dL
Specific Gravity, Urine: 1.021 (ref 1.005–1.030)
UROBILINOGEN UA: 0.2 mg/dL (ref 0.0–1.0)

## 2014-02-23 LAB — SURGICAL PCR SCREEN
MRSA, PCR: NEGATIVE
Staphylococcus aureus: POSITIVE — AB

## 2014-02-23 LAB — PROTIME-INR
INR: 1.05 (ref 0.00–1.49)
PROTHROMBIN TIME: 13.8 s (ref 11.6–15.2)

## 2014-02-23 LAB — APTT: aPTT: 27 seconds (ref 24–37)

## 2014-02-23 NOTE — Pre-Procedure Instructions (Signed)
SAMAJ WESSELLS  02/23/2014   Your procedure is scheduled on:  Wednesday, March 04, 2014 at 12:50 PM.   Report to [facility] Entrance "A" Admitting Office at 10:50 AM.   Call this number if you have problems the morning of surgery: [phone]                Any questions prior to day of surgery, please call [phone] between 8 & 4 PM.    Remember:   Do not eat food or drink liquids after midnight Tuesday, 03/03/14.   Take these medicines the morning of surgery with A SIP OF WATER: none  Stop Aspirin and Meloxicam as of Wednesday, 02/25/14.   Do not wear jewelry  Do not wear lotions, powders, or cologne. You may wear deodorant.  Men may shave face and neck.  Do not bring valuables to the hospital.  [facility] is not responsible                  for any belongings or valuables.               Contacts, dentures or bridgework may not be worn into surgery.  Leave suitcase in the car. After surgery it may be brought to your room.  For patients admitted to the hospital, discharge time is determined by your                treatment team.             Special Instructions: [place] - Preparing for Surgery  Before surgery, you can play an important role.  Because skin is not sterile, your skin needs to be as free of germs as possible.  You can reduce the number of germs on you skin by washing with CHG (chlorahexidine gluconate) soap before surgery.  CHG is an antiseptic cleaner which kills germs and bonds with the skin to continue killing germs even after washing.  Please DO NOT use if you have an allergy to CHG or antibacterial soaps.  If your skin becomes reddened/irritated stop using the CHG and inform your nurse when you arrive at Short Stay.  Do not shave (including legs and underarms) for at least 48 hours prior to the first CHG shower.  You may shave your face.  Please follow these instructions carefully:   1.  Shower with CHG Soap the night before surgery and the                                 morning of Surgery.  2.  If you choose to wash your hair, wash your hair first as usual with your       normal shampoo.  3.  After you shampoo, rinse your hair and body thoroughly to remove the                      Shampoo.  4.  Use CHG as you would any other liquid soap.  You can apply chg directly       to the skin and wash gently with scrungie or a clean washcloth.  5.  Apply the CHG Soap to your body ONLY FROM THE NECK DOWN.        Do not use on open wounds or open sores.  Avoid contact with your eyes, ears, mouth and genitals (private parts).  Wash genitals (private parts) with your  normal soap.  6.  Wash thoroughly, paying special attention to the area where your surgery        will be performed.  7.  Thoroughly rinse your body with warm water from the neck down.  8.  DO NOT shower/wash with your normal soap after using and rinsing off       the CHG Soap.  9.  Pat yourself dry with a clean towel.            10.  Wear clean pajamas.            11.  Place clean sheets on your bed the night of your first shower and do not        sleep with pets.  Day of Surgery  Do not apply any lotions the morning of surgery.  Please wear clean clothes to the hospital.     Please read over the following fact sheets that you were given: Pain Booklet, Coughing and Deep Breathing, Blood Transfusion Information, MRSA Information and Surgical Site Infection Prevention

## 2014-02-23 NOTE — Progress Notes (Signed)
Mupirocin Ointment Rx called into [facility] in [place] for positive PCR of staph. Pt notified and voiced understanding.

## 2014-02-24 ENCOUNTER — Other Ambulatory Visit: Payer: Self-pay | Admitting: Physician Assistant

## 2014-02-24 ENCOUNTER — Inpatient Hospital Stay (HOSPITAL_COMMUNITY): Admission: RE | Admit: 2014-02-24 | Payer: Commercial Managed Care - PPO | Source: Ambulatory Visit

## 2014-02-24 MED ORDER — AZITHROMYCIN 250 MG PO TABS: ORAL_TABLET | ORAL | Status: DC

## 2014-03-03 MED ORDER — CEFAZOLIN SODIUM-DEXTROSE 2-3 GM-% IV SOLR
2.0000 g | INTRAVENOUS | Status: AC
  Administered 2014-03-04: 2 g via INTRAVENOUS
  Filled 2014-03-03: qty 50

## 2014-03-03 MED ORDER — DEXTROSE-NACL 5-0.45 % IV SOLN: INTRAVENOUS | Status: DC

## 2014-03-03 NOTE — H&P (Signed)
TOTAL KNEE ADMISSION H&P  Patient is being admitted for right total knee arthroplasty.  Subjective:  Chief Complaint:right knee pain.  HPI: Jermaine Stewart, 54 y.o. male, has a history of pain and functional disability in the right knee due to arthritis and has failed non-surgical conservative treatments for greater than 12 weeks to includeNSAID's and/or analgesics, corticosteriod injections, viscosupplementation injections, flexibility and strengthening excercises, supervised PT with diminished ADL's post treatment, use of assistive devices and activity modification.  Onset of symptoms was gradual, starting 10 years ago with gradually worsening course since that time. The patient noted no past surgery on the right knee(s).  Patient currently rates pain in the right knee(s) at 10 out of 10 with activity. Patient has night pain, worsening of pain with activity and weight bearing, pain that interferes with activities of daily living, pain with passive range of motion and crepitus.  Patient has evidence of joint space narrowing by imaging studies.  There is no active infection.  Patient Active Problem List   Diagnosis Date Noted  . Arthritis of knee 12/17/2013  . Prediabetes 07/22/2013  . Medication management 07/22/2013  . Essential hypertension 03/12/2013  . Vitamin D deficiency   . Hyperlipidemia    Past Medical History  Diagnosis Date  . Hyperlipidemia   . Unspecified vitamin D deficiency     states he takes Vit D just as prevention  . Hypertension   . Arthritis     Past Surgical History  Procedure Laterality Date  . Hernia repair Bilateral     15 years ago  . Total knee arthroplasty Left 12/17/2013    Procedure: TOTAL KNEE ARTHROPLASTY;  Surgeon: Nestor Lewandowsky, MD;  Location: [facility] OR;  Service: Orthopedics;  Laterality: Left;  Marland Kitchen Vasectomy      No prescriptions prior to admission   No Known Allergies  History  Substance Use Topics  . Smoking status: Never Smoker   . Smokeless  tobacco: Current User    Types: Chew  . Alcohol Use: 1.2 oz/week    2 Cans of beer per week     Comment: 2-3 beer a day    Family History  Problem Relation Age of Onset  . Cancer Mother     BREAST  . Hypertension Brother   . Stroke Brother   . Hypertension Brother   . Hypertension Brother      Review of Systems  Constitutional: Negative.   HENT: Negative.   Eyes: Negative.   Respiratory: Negative.   Cardiovascular: Negative.   Gastrointestinal: Negative.   Genitourinary: Negative.   Musculoskeletal: Positive for joint pain.  Skin: Negative.   Neurological: Negative.   Endo/Heme/Allergies: Bruises/bleeds easily.  Psychiatric/Behavioral: Negative.     Objective:  Physical Exam  Constitutional: He is oriented to person, place, and time. He appears well-developed and well-nourished.  HENT:  Head: Normocephalic and atraumatic.  Eyes: Pupils are equal, round, and reactive to light.  Neck: Normal range of motion. Neck supple.  Cardiovascular: Intact distal pulses.   Respiratory: Effort normal.  Musculoskeletal: He exhibits tenderness.  Neurological: He is alert and oriented to person, place, and time.  Skin: Skin is warm and dry.  Psychiatric: He has a normal mood and affect. His behavior is normal. Judgment and thought content normal.    Vital signs in last 24 hours:    Labs:   Estimated body mass index is 23.44 kg/(m^2) as calculated from the following:   Height as of 12/17/13:  (1.753 m).  Weight as of 12/11/13: 72.031 kg (158 lb 12.8 oz).   Imaging Review Plain radiographs demonstrate bilateral AP weightbearing, bilateral Rosenberg, lateral and sunrise views of the left knee are taken and reviewed in office today.  Patient does have obvious medial joint space narrowing bilaterally.  Patient also has moderate patellofemoral arthritis with obvious spurring.  Assessment/Plan:  End stage arthritis, right knee   The patient history, physical examination,  clinical judgment of the provider and imaging studies are consistent with end stage degenerative joint disease of the right knee(s) and total knee arthroplasty is deemed medically necessary. The treatment options including medical management, injection therapy arthroscopy and arthroplasty were discussed at length. The risks and benefits of total knee arthroplasty were presented and reviewed. The risks due to aseptic loosening, infection, stiffness, patella tracking problems, thromboembolic complications and other imponderables were discussed. The patient acknowledged the explanation, agreed to proceed with the plan and consent was signed. Patient is being admitted for inpatient treatment for surgery, pain control, PT, OT, prophylactic antibiotics, VTE prophylaxis, progressive ambulation and ADL's and discharge planning. The patient is planning to be discharged home with home health services

## 2014-03-04 ENCOUNTER — Encounter (HOSPITAL_COMMUNITY)
Admission: RE | Disposition: A | Discharge status: Home Health | Payer: Self-pay | Source: Ambulatory Visit | Attending: Orthopedic Surgery

## 2014-03-04 ENCOUNTER — Inpatient Hospital Stay (HOSPITAL_COMMUNITY): Payer: Commercial Managed Care - PPO | Admitting: Anesthesiology

## 2014-03-04 ENCOUNTER — Encounter (HOSPITAL_COMMUNITY): Payer: Self-pay | Admitting: Surgery

## 2014-03-04 ENCOUNTER — Inpatient Hospital Stay (HOSPITAL_COMMUNITY)
Admission: RE | Admit: 2014-03-04 | Discharge: 2014-03-06 | DRG: 470 | Disposition: A | Discharge status: Home Health | Payer: Commercial Managed Care - PPO | Source: Ambulatory Visit | Attending: Orthopedic Surgery | Admitting: Orthopedic Surgery

## 2014-03-04 DIAGNOSIS — D62 Acute posthemorrhagic anemia: Secondary | ICD-10-CM | POA: Diagnosis not present

## 2014-03-04 DIAGNOSIS — Z96652 Presence of left artificial knee joint: Secondary | ICD-10-CM | POA: Diagnosis present

## 2014-03-04 DIAGNOSIS — E559 Vitamin D deficiency, unspecified: Secondary | ICD-10-CM | POA: Diagnosis present

## 2014-03-04 DIAGNOSIS — Z8249 Family history of ischemic heart disease and other diseases of the circulatory system: Secondary | ICD-10-CM

## 2014-03-04 DIAGNOSIS — E785 Hyperlipidemia, unspecified: Secondary | ICD-10-CM | POA: Diagnosis present

## 2014-03-04 DIAGNOSIS — Z79899 Other long term (current) drug therapy: Secondary | ICD-10-CM

## 2014-03-04 DIAGNOSIS — M1711 Unilateral primary osteoarthritis, right knee: Principal | ICD-10-CM | POA: Diagnosis present

## 2014-03-04 DIAGNOSIS — G8918 Other acute postprocedural pain: Secondary | ICD-10-CM | POA: Diagnosis not present

## 2014-03-04 DIAGNOSIS — Z823 Family history of stroke: Secondary | ICD-10-CM | POA: Diagnosis not present

## 2014-03-04 DIAGNOSIS — Z7982 Long term (current) use of aspirin: Secondary | ICD-10-CM | POA: Diagnosis not present

## 2014-03-04 DIAGNOSIS — M25561 Pain in right knee: Secondary | ICD-10-CM | POA: Diagnosis present

## 2014-03-04 DIAGNOSIS — I10 Essential (primary) hypertension: Secondary | ICD-10-CM | POA: Diagnosis present

## 2014-03-04 HISTORY — PX: TOTAL KNEE ARTHROPLASTY: SHX125

## 2014-03-04 SURGERY — ARTHROPLASTY, KNEE, TOTAL
Anesthesia: General | Site: Knee | Laterality: Right

## 2014-03-04 MED ORDER — MIDAZOLAM HCL 5 MG/5ML IJ SOLN
INTRAMUSCULAR | Status: DC | PRN
  Administered 2014-03-04: 2 mg via INTRAVENOUS

## 2014-03-04 MED ORDER — METHOCARBAMOL 500 MG PO TABS
500.0000 mg | ORAL_TABLET | Freq: Four times a day (QID) | ORAL | Status: DC | PRN
  Administered 2014-03-04 – 2014-03-06 (×4): 500 mg via ORAL
  Filled 2014-03-04 (×4): qty 1

## 2014-03-04 MED ORDER — CEFUROXIME SODIUM 1.5 G IJ SOLR
INTRAMUSCULAR | Status: AC
  Filled 2014-03-04: qty 1.5

## 2014-03-04 MED ORDER — ASPIRIN EC 325 MG PO TBEC
325.0000 mg | DELAYED_RELEASE_TABLET | Freq: Every day | ORAL | Status: DC
  Administered 2014-03-05 – 2014-03-06 (×2): 325 mg via ORAL
  Filled 2014-03-04 (×3): qty 1

## 2014-03-04 MED ORDER — ACETAMINOPHEN 325 MG PO TABS: 650.0000 mg | ORAL_TABLET | Freq: Four times a day (QID) | ORAL | Status: DC | PRN

## 2014-03-04 MED ORDER — DIPHENHYDRAMINE HCL 12.5 MG/5ML PO ELIX: 12.5000 mg | ORAL_SOLUTION | ORAL | Status: DC | PRN

## 2014-03-04 MED ORDER — ACETAMINOPHEN 325 MG PO TABS: 325.0000 mg | ORAL_TABLET | ORAL | Status: DC | PRN

## 2014-03-04 MED ORDER — ACETAMINOPHEN 160 MG/5ML PO SOLN: 325.0000 mg | ORAL | Status: DC | PRN

## 2014-03-04 MED ORDER — HYDROMORPHONE HCL 1 MG/ML IJ SOLN
0.2500 mg | INTRAMUSCULAR | Status: DC | PRN
  Administered 2014-03-04: 0.5 mg via INTRAVENOUS

## 2014-03-04 MED ORDER — METOCLOPRAMIDE HCL 5 MG/ML IJ SOLN
5.0000 mg | Freq: Three times a day (TID) | INTRAMUSCULAR | Status: DC | PRN
  Administered 2014-03-04: 10 mg via INTRAVENOUS
  Filled 2014-03-04: qty 2

## 2014-03-04 MED ORDER — LISINOPRIL-HYDROCHLOROTHIAZIDE 20-25 MG PO TABS: 1.0000 | ORAL_TABLET | Freq: Every day | ORAL | Status: DC

## 2014-03-04 MED ORDER — SENNOSIDES-DOCUSATE SODIUM 8.6-50 MG PO TABS: 1.0000 | ORAL_TABLET | Freq: Every evening | ORAL | Status: DC | PRN

## 2014-03-04 MED ORDER — ALUM & MAG HYDROXIDE-SIMETH 200-200-20 MG/5ML PO SUSP: 30.0000 mL | ORAL | Status: DC | PRN

## 2014-03-04 MED ORDER — ACETAMINOPHEN 650 MG RE SUPP: 650.0000 mg | Freq: Four times a day (QID) | RECTAL | Status: DC | PRN

## 2014-03-04 MED ORDER — SODIUM CHLORIDE 0.9 % IR SOLN
Status: DC | PRN
  Administered 2014-03-04: 1000 mL
  Administered 2014-03-04: 1

## 2014-03-04 MED ORDER — MENTHOL 3 MG MT LOZG: 1.0000 | LOZENGE | OROMUCOSAL | Status: DC | PRN

## 2014-03-04 MED ORDER — FENTANYL CITRATE 0.05 MG/ML IJ SOLN
INTRAMUSCULAR | Status: AC
  Filled 2014-03-04: qty 2

## 2014-03-04 MED ORDER — CEFUROXIME SODIUM 1.5 G IJ SOLR
INTRAMUSCULAR | Status: DC | PRN
  Administered 2014-03-04: 1.5 g

## 2014-03-04 MED ORDER — ONDANSETRON HCL 4 MG/2ML IJ SOLN
INTRAMUSCULAR | Status: AC
  Filled 2014-03-04: qty 2

## 2014-03-04 MED ORDER — LISINOPRIL 20 MG PO TABS
20.0000 mg | ORAL_TABLET | Freq: Every day | ORAL | Status: DC
  Administered 2014-03-04 – 2014-03-06 (×3): 20 mg via ORAL
  Filled 2014-03-04 (×3): qty 1

## 2014-03-04 MED ORDER — MIDAZOLAM HCL 2 MG/2ML IJ SOLN
INTRAMUSCULAR | Status: AC
  Administered 2014-03-04: 1 mg
  Filled 2014-03-04: qty 2

## 2014-03-04 MED ORDER — DOCUSATE SODIUM 100 MG PO CAPS
100.0000 mg | ORAL_CAPSULE | Freq: Two times a day (BID) | ORAL | Status: DC
  Administered 2014-03-04 – 2014-03-06 (×4): 100 mg via ORAL
  Filled 2014-03-04 (×5): qty 1

## 2014-03-04 MED ORDER — MEPERIDINE HCL 25 MG/ML IJ SOLN: 6.2500 mg | INTRAMUSCULAR | Status: DC | PRN

## 2014-03-04 MED ORDER — ONDANSETRON HCL 4 MG/2ML IJ SOLN: 4.0000 mg | Freq: Four times a day (QID) | INTRAMUSCULAR | Status: DC | PRN

## 2014-03-04 MED ORDER — ASPIRIN EC 325 MG PO TBEC: 325.0000 mg | DELAYED_RELEASE_TABLET | Freq: Two times a day (BID) | ORAL | Status: DC

## 2014-03-04 MED ORDER — LIDOCAINE HCL (CARDIAC) 20 MG/ML IV SOLN
INTRAVENOUS | Status: AC
  Filled 2014-03-04: qty 5

## 2014-03-04 MED ORDER — FENTANYL CITRATE 0.05 MG/ML IJ SOLN
INTRAMUSCULAR | Status: DC | PRN
  Administered 2014-03-04 (×2): 50 ug via INTRAVENOUS
  Administered 2014-03-04: 100 ug via INTRAVENOUS
  Administered 2014-03-04 (×2): 50 ug via INTRAVENOUS
  Administered 2014-03-04: 100 ug via INTRAVENOUS
  Administered 2014-03-04 (×2): 50 ug via INTRAVENOUS
  Administered 2014-03-04: 100 ug via INTRAVENOUS

## 2014-03-04 MED ORDER — METHOCARBAMOL 500 MG PO TABS: 500.0000 mg | ORAL_TABLET | Freq: Two times a day (BID) | ORAL | Status: DC

## 2014-03-04 MED ORDER — SODIUM CHLORIDE 0.9 % IJ SOLN
INTRAMUSCULAR | Status: DC | PRN
  Administered 2014-03-04: 40 mL

## 2014-03-04 MED ORDER — KCL IN DEXTROSE-NACL 20-5-0.45 MEQ/L-%-% IV SOLN
INTRAVENOUS | Status: DC
  Administered 2014-03-04 – 2014-03-06 (×5): via INTRAVENOUS
  Filled 2014-03-04 (×8): qty 1000

## 2014-03-04 MED ORDER — OXYCODONE HCL 5 MG PO TABS
5.0000 mg | ORAL_TABLET | ORAL | Status: DC | PRN
  Administered 2014-03-04 – 2014-03-06 (×11): 10 mg via ORAL
  Filled 2014-03-04 (×11): qty 2

## 2014-03-04 MED ORDER — FENTANYL CITRATE 0.05 MG/ML IJ SOLN
INTRAMUSCULAR | Status: AC
  Filled 2014-03-04: qty 5

## 2014-03-04 MED ORDER — LIDOCAINE HCL (CARDIAC) 20 MG/ML IV SOLN
INTRAVENOUS | Status: DC | PRN
  Administered 2014-03-04: 100 mg via INTRAVENOUS

## 2014-03-04 MED ORDER — ONDANSETRON HCL 4 MG PO TABS
4.0000 mg | ORAL_TABLET | Freq: Four times a day (QID) | ORAL | Status: DC | PRN
  Administered 2014-03-04 – 2014-03-05 (×2): 4 mg via ORAL
  Filled 2014-03-04 (×2): qty 1

## 2014-03-04 MED ORDER — HYDROCHLOROTHIAZIDE 25 MG PO TABS
25.0000 mg | ORAL_TABLET | Freq: Every day | ORAL | Status: DC
  Administered 2014-03-04 – 2014-03-06 (×3): 25 mg via ORAL
  Filled 2014-03-04 (×3): qty 1

## 2014-03-04 MED ORDER — METOCLOPRAMIDE HCL 10 MG PO TABS: 5.0000 mg | ORAL_TABLET | Freq: Three times a day (TID) | ORAL | Status: DC | PRN

## 2014-03-04 MED ORDER — OXYCODONE HCL 5 MG/5ML PO SOLN: 5.0000 mg | Freq: Once | ORAL | Status: DC | PRN

## 2014-03-04 MED ORDER — OXYCODONE-ACETAMINOPHEN 5-325 MG PO TABS: 1.0000 | ORAL_TABLET | ORAL | Status: DC | PRN

## 2014-03-04 MED ORDER — BUPIVACAINE LIPOSOME 1.3 % IJ SUSP
20.0000 mL | INTRAMUSCULAR | Status: DC
  Filled 2014-03-04: qty 20

## 2014-03-04 MED ORDER — LACTATED RINGERS IV SOLN
INTRAVENOUS | Status: DC
  Administered 2014-03-04: 11:00:00 via INTRAVENOUS

## 2014-03-04 MED ORDER — BUPIVACAINE LIPOSOME 1.3 % IJ SUSP
INTRAMUSCULAR | Status: DC | PRN
  Administered 2014-03-04: 20 mL

## 2014-03-04 MED ORDER — PHENYLEPHRINE HCL 10 MG/ML IJ SOLN
INTRAMUSCULAR | Status: DC | PRN
  Administered 2014-03-04: 40 ug via INTRAVENOUS

## 2014-03-04 MED ORDER — MAGNESIUM CITRATE PO SOLN: 1.0000 | Freq: Once | ORAL | Status: AC | PRN

## 2014-03-04 MED ORDER — HYDROMORPHONE HCL 1 MG/ML IJ SOLN
INTRAMUSCULAR | Status: AC
  Filled 2014-03-04: qty 1

## 2014-03-04 MED ORDER — METHOCARBAMOL 1000 MG/10ML IJ SOLN
500.0000 mg | Freq: Four times a day (QID) | INTRAVENOUS | Status: DC | PRN
  Filled 2014-03-04: qty 5

## 2014-03-04 MED ORDER — CHLORHEXIDINE GLUCONATE 4 % EX LIQD
60.0000 mL | Freq: Once | CUTANEOUS | Status: DC
  Filled 2014-03-04: qty 60

## 2014-03-04 MED ORDER — MIDAZOLAM HCL 2 MG/2ML IJ SOLN: 1.0000 mg | Freq: Once | INTRAMUSCULAR | Status: DC

## 2014-03-04 MED ORDER — OXYCODONE HCL 5 MG PO TABS: 5.0000 mg | ORAL_TABLET | Freq: Once | ORAL | Status: DC | PRN

## 2014-03-04 MED ORDER — TRANEXAMIC ACID 100 MG/ML IV SOLN
1000.0000 mg | INTRAVENOUS | Status: AC
  Administered 2014-03-04: 1000 mg via INTRAVENOUS
  Filled 2014-03-04: qty 10

## 2014-03-04 MED ORDER — 0.9 % SODIUM CHLORIDE (POUR BTL) OPTIME
TOPICAL | Status: DC | PRN
  Administered 2014-03-04: 1000 mL

## 2014-03-04 MED ORDER — ONDANSETRON HCL 4 MG/2ML IJ SOLN
INTRAMUSCULAR | Status: DC | PRN
  Administered 2014-03-04: 4 mg via INTRAVENOUS

## 2014-03-04 MED ORDER — TAMSULOSIN HCL 0.4 MG PO CAPS
0.4000 mg | ORAL_CAPSULE | Freq: Every day | ORAL | Status: DC
  Administered 2014-03-04 – 2014-03-05 (×2): 0.4 mg via ORAL
  Filled 2014-03-04 (×3): qty 1

## 2014-03-04 MED ORDER — PROMETHAZINE HCL 25 MG/ML IJ SOLN: 6.2500 mg | INTRAMUSCULAR | Status: DC | PRN

## 2014-03-04 MED ORDER — HYDROMORPHONE HCL 1 MG/ML IJ SOLN
1.0000 mg | INTRAMUSCULAR | Status: DC | PRN
  Administered 2014-03-04 – 2014-03-05 (×10): 1 mg via INTRAVENOUS
  Filled 2014-03-04 (×10): qty 1

## 2014-03-04 MED ORDER — PROPOFOL 10 MG/ML IV BOLUS
INTRAVENOUS | Status: AC
  Filled 2014-03-04: qty 20

## 2014-03-04 MED ORDER — BISACODYL 5 MG PO TBEC
5.0000 mg | DELAYED_RELEASE_TABLET | Freq: Every day | ORAL | Status: DC | PRN
  Administered 2014-03-06: 5 mg via ORAL
  Filled 2014-03-04: qty 1

## 2014-03-04 MED ORDER — LACTATED RINGERS IV SOLN
INTRAVENOUS | Status: DC | PRN
  Administered 2014-03-04 (×2): via INTRAVENOUS

## 2014-03-04 MED ORDER — PHENOL 1.4 % MT LIQD: 1.0000 | OROMUCOSAL | Status: DC | PRN

## 2014-03-04 MED ORDER — FENTANYL CITRATE 0.05 MG/ML IJ SOLN
50.0000 ug | Freq: Once | INTRAMUSCULAR | Status: AC
  Administered 2014-03-04: 50 ug via INTRAVENOUS

## 2014-03-04 MED ORDER — FENTANYL CITRATE 0.05 MG/ML IJ SOLN: 25.0000 ug | INTRAMUSCULAR | Status: DC | PRN

## 2014-03-04 MED ORDER — PROPOFOL 10 MG/ML IV BOLUS
INTRAVENOUS | Status: DC | PRN
  Administered 2014-03-04: 200 mg via INTRAVENOUS

## 2014-03-04 SURGICAL SUPPLY — 66 items
BANDAGE ESMARK 6X9 LF (GAUZE/BANDAGES/DRESSINGS) ×1 IMPLANT
BLADE SAG 18X100X1.27 (BLADE) ×2 IMPLANT
BLADE SAW SGTL 13X75X1.27 (BLADE) ×2 IMPLANT
BLADE SURG ROTATE 9660 (MISCELLANEOUS) IMPLANT
BNDG ELASTIC 6X10 VLCR STRL LF (GAUZE/BANDAGES/DRESSINGS) ×2 IMPLANT
BNDG ESMARK 6X9 LF (GAUZE/BANDAGES/DRESSINGS) ×2
BOWL SMART MIX CTS (DISPOSABLE) ×2 IMPLANT
CAPT KNEE TOTAL 3 ATTUNE ×2 IMPLANT
CEMENT HV SMART SET (Cement) ×4 IMPLANT
COVER SURGICAL LIGHT HANDLE (MISCELLANEOUS) ×2 IMPLANT
CUFF TOURNIQUET SINGLE 34IN LL (TOURNIQUET CUFF) ×2 IMPLANT
CUFF TOURNIQUET SINGLE 44IN (TOURNIQUET CUFF) IMPLANT
DRAPE EXTREMITY T 121X128X90 (DRAPE) ×2 IMPLANT
DRAPE IMP U-DRAPE 54X76 (DRAPES) ×2 IMPLANT
DRAPE U-SHAPE 47X51 STRL (DRAPES) ×2 IMPLANT
DRSG PAD ABDOMINAL 8X10 ST (GAUZE/BANDAGES/DRESSINGS) ×4 IMPLANT
DURAPREP 26ML APPLICATOR (WOUND CARE) ×2 IMPLANT
ELECT REM PT RETURN 9FT ADLT (ELECTROSURGICAL) ×2
ELECTRODE REM PT RTRN 9FT ADLT (ELECTROSURGICAL) ×1 IMPLANT
EVACUATOR 1/8 PVC DRAIN (DRAIN) ×2 IMPLANT
GAUZE SPONGE 4X4 12PLY STRL (GAUZE/BANDAGES/DRESSINGS) ×2 IMPLANT
GAUZE XEROFORM 1X8 LF (GAUZE/BANDAGES/DRESSINGS) ×2 IMPLANT
GLOVE BIO SURGEON STRL SZ 6.5 (GLOVE) ×2 IMPLANT
GLOVE BIO SURGEON STRL SZ7 (GLOVE) ×2 IMPLANT
GLOVE BIO SURGEON STRL SZ7.5 (GLOVE) ×2 IMPLANT
GLOVE BIO SURGEON STRL SZ8.5 (GLOVE) ×2 IMPLANT
GLOVE BIOGEL PI IND STRL 6.5 (GLOVE) ×1 IMPLANT
GLOVE BIOGEL PI IND STRL 7.0 (GLOVE) ×2 IMPLANT
GLOVE BIOGEL PI IND STRL 8 (GLOVE) ×1 IMPLANT
GLOVE BIOGEL PI IND STRL 9 (GLOVE) ×1 IMPLANT
GLOVE BIOGEL PI INDICATOR 6.5 (GLOVE) ×1
GLOVE BIOGEL PI INDICATOR 7.0 (GLOVE) ×2
GLOVE BIOGEL PI INDICATOR 8 (GLOVE) ×1
GLOVE BIOGEL PI INDICATOR 9 (GLOVE) ×1
GLOVE SURG SS PI 6.5 STRL IVOR (GLOVE) ×2 IMPLANT
GOWN STRL REUS W/ TWL LRG LVL3 (GOWN DISPOSABLE) ×1 IMPLANT
GOWN STRL REUS W/ TWL XL LVL3 (GOWN DISPOSABLE) ×3 IMPLANT
GOWN STRL REUS W/TWL LRG LVL3 (GOWN DISPOSABLE) ×1
GOWN STRL REUS W/TWL XL LVL3 (GOWN DISPOSABLE) ×3
HANDPIECE INTERPULSE COAX TIP (DISPOSABLE) ×1
HOOD PEEL AWAY FACE SHEILD DIS (HOOD) ×6 IMPLANT
KIT BASIN OR (CUSTOM PROCEDURE TRAY) ×2 IMPLANT
KIT ROOM TURNOVER OR (KITS) ×2 IMPLANT
MANIFOLD NEPTUNE II (INSTRUMENTS) ×2 IMPLANT
NDL SAFETY ECLIPSE 18X1.5 (NEEDLE) IMPLANT
NEEDLE 22X1 1/2 (OR ONLY) (NEEDLE) ×2 IMPLANT
NEEDLE HYPO 18GX1.5 SHARP (NEEDLE)
NEEDLE SPNL 18GX3.5 QUINCKE PK (NEEDLE) ×2 IMPLANT
NS IRRIG 1000ML POUR BTL (IV SOLUTION) ×2 IMPLANT
PACK TOTAL JOINT (CUSTOM PROCEDURE TRAY) ×2 IMPLANT
PACK UNIVERSAL I (CUSTOM PROCEDURE TRAY) ×2 IMPLANT
PAD ARMBOARD 7.5X6 YLW CONV (MISCELLANEOUS) ×2 IMPLANT
PADDING CAST COTTON 6X4 STRL (CAST SUPPLIES) ×2 IMPLANT
SET HNDPC FAN SPRY TIP SCT (DISPOSABLE) ×1 IMPLANT
STAPLER VISISTAT 35W (STAPLE) ×2 IMPLANT
SUCTION FRAZIER TIP 10 FR DISP (SUCTIONS) IMPLANT
SUT VIC AB 0 CTX 36 (SUTURE) ×1
SUT VIC AB 0 CTX36XBRD ANTBCTR (SUTURE) ×1 IMPLANT
SUT VIC AB 1 CTX 36 (SUTURE) ×1
SUT VIC AB 1 CTX36XBRD ANBCTR (SUTURE) ×1 IMPLANT
SUT VIC AB 2-0 CT1 27 (SUTURE) ×1
SUT VIC AB 2-0 CT1 TAPERPNT 27 (SUTURE) ×1 IMPLANT
SYR 30ML LL (SYRINGE) IMPLANT
SYR 50ML LL SCALE MARK (SYRINGE) ×2 IMPLANT
TOWEL OR 17X24 6PK STRL BLUE (TOWEL DISPOSABLE) ×2 IMPLANT
TOWEL OR 17X26 10 PK STRL BLUE (TOWEL DISPOSABLE) ×2 IMPLANT

## 2014-03-04 NOTE — Interval H&P Note (Signed)
History and Physical Interval Note:  03/04/2014 12:58 PM  Jermaine Stewart  has presented today for surgery, with the diagnosis of RIGHT KNEE OSTEOARTHRITIS  The various methods of treatment have been discussed with the patient and family. After consideration of risks, benefits and other options for treatment, the patient has consented to  Procedure(s): TOTAL KNEE ARTHROPLASTY (Right) as a surgical intervention .  The patient's history has been reviewed, patient examined, no change in status, stable for surgery.  I have reviewed the patient's chart and labs.  Questions were answered to the patient's satisfaction.     Nestor LewandowskyOWAN,[name]

## 2014-03-04 NOTE — Progress Notes (Signed)
Orthopedic Tech Progress Note Patient Details:  Julien NordmannJeffrey D Markus 30-Mar-1960 [id number]  CPM Right Knee CPM Right Knee: On Right Knee Flexion (Degrees): 60 Right Knee Extension (Degrees): 0 Additional Comments: trapeze bar patient helper Viewed order from doctor's order list  Nikki DomCrawford, Cordia Miklos 03/04/2014, 4:31 PM

## 2014-03-04 NOTE — Progress Notes (Signed)
Pt given  Dilaudid 0.5mg  IV for pain.

## 2014-03-04 NOTE — Op Note (Signed)
PATIENT ID:      Jermaine NordmannJeffrey D Stewart  MRN:     [id number] DOB/AGE:    April 21, 1960 / 54 y.o.       OPERATIVE REPORT    DATE OF PROCEDURE:  03/04/2014       PREOPERATIVE DIAGNOSIS:   RIGHT KNEE OSTEOARTHRITIS      Estimated body mass index is 23.01 kg/(m^2) as calculated from the following:   Height as of 02/23/14: 5' 9.5" (1.765 m).   Weight as of this encounter: 71.668 kg (158 lb).                                                        POSTOPERATIVE DIAGNOSIS:   RIGHT KNEE OSTEOARTHRITIS                                                                      PROCEDURE:  Procedure(s): TOTAL KNEE ARTHROPLASTY Using DepuyAttune RP implants #6R Femur, #8Tibia, 6mm Attune RP bearing, 41 Patella     SURGEON: Montario Zilka J    ASSISTANT:   Eric K. [employer] PA-C   (Present and scrubbed throughout the case, critical for assistance with exposure, retraction, instrumentation, and closure.)         ANESTHESIA: GET, ACB, Exparel  DRAINS: 2 medium hemovac in knee   TOURNIQUET TIME: 75min   COMPLICATIONS:  None     SPECIMENS: None   INDICATIONS FOR PROCEDURE: The patient has  RIGHT KNEE OSTEOARTHRITIS, varus deformities, XR shows bone on bone arthritis. Patient has failed all conservative measures including anti-inflammatory medicines, narcotics, attempts at  exercise and weight loss, cortisone injections and viscosupplementation.  Risks and benefits of surgery have been discussed, questions answered.   DESCRIPTION OF PROCEDURE: The patient identified by armband, received  IV antibiotics[facility] Patient taken to the operating room, appropriate anesthetic  monitors were attached, and general endotracheal anesthesia induced with  the patient in supine position, Foley catheter was inserted. Tourniquet  applied high to the operative thigh. Lateral post and foot positioner  applied to the table, the lower extremity was then prepped and draped  in usual sterile fashion from  the ankle to the tourniquet. Time-out procedure was performed. The limb was wrapped with an Esmarch bandage and the tourniquet inflated to 350 mmHg. We began the operation by making the anterior midline incision starting at handbreadth above the patella going over the patella 1 cm medial to and  4 cm distal to the tibial tubercle. Small bleeders in the skin and the  subcutaneous tissue identified and cauterized. Transverse retinaculum was incised and reflected medially and a medial parapatellar arthrotomy was accomplished. the patella was everted and theprepatellar fat pad resected. The superficial medial collateral  ligament was then elevated from anterior to posterior along the proximal  flare of the tibia and anterior half of the menisci resected. The knee was hyperflexed exposing bone on bone arthritis. Peripheral and notch osteophytes as well as the cruciate ligaments were then resected. We continued to  work our way around posteriorly along the  proximal tibia, and externally  rotated the tibia subluxing it out from underneath the femur. A McHale  retractor was placed through the notch and a lateral Hohmann retractor  placed, and we then drilled through the proximal tibia in line with the  axis of the tibia followed by an intramedullary guide rod and 2-degree  posterior slope cutting guide. The tibial cutting guide was pinned into place  allowing resection of 4 mm of bone medially and about 10 mm of bone  laterally because of her varus deformity. Satisfied with the tibial resection, we then  entered the distal femur 2 mm anterior to the PCL origin with the  intramedullary guide rod and applied the distal femoral cutting guide  set at 11mm, with 5 degrees of valgus. This was pinned along the  epicondylar axis. At this point, the distal femoral cut was accomplished without difficulty. We then sized for a #6R femoral component and pinned the guide in 3 degrees of external rotation.The chamfer  cutting guide was pinned into place. The anterior, posterior, and chamfer cuts were accomplished without difficulty followed by  the Attune RP box cutting guide and the box cut. We also removed posterior osteophytes from the posterior femoral condyles. At this  time, the knee was brought into full extension. We checked our  extension and flexion gaps and found them symmetric at 10mm.  The patella thickness measured at 25 mm. We set the cutting guide at 15 and removed the posterior 10 mm  of the patella, sized for a 41 button and drilled the lollipop. The knee  was then once again hyperflexed exposing the proximal tibia. We sized for a #8 tibial base plate, applied the smokestack and the conical reamer followed by the the Delta fin keel punch. We then hammered into place the Attune RP trial femoral component, inserted a 6-mm trial bearing, trial patellar button, and took the knee through range of motion from 0-130 degrees. No thumb pressure was required for patellar  tracking. At this point, all trial components were removed, a double batch of DePuy HV cement with 1500 mg of Zinacef was mixed and applied to all bony metallic mating surfaces except for the posterior condyles of the femur itself. In order, we  hammered into place the tibial tray and removed excess cement, the femoral component and removed excess cement, a 6-mm AttuneRP bearing  was inserted, and the knee brought to full extension with compression.  The patellar button was clamped into place, and excess cement  removed. While the cement cured the wound was irrigated out with normal saline solution pulse lavage, and medium Hemovac drains were placed from an anterolateral  approach. Ligament stability and patellar tracking were checked and found to be excellent. The parapatellar arthrotomy was closed with  running #1 Vicryl suture. The subcutaneous tissue with 0 and 2-0 undyed  Vicryl suture, and the skin with skin staples. A dressing of  Xeroform,  4 x 4, dressing sponges, Webril, and Ace wrap applied. The patient  awakened, extubated, and taken to recovery room without difficulty.   Gean Birchwood J 03/04/2014, 3:19 PM

## 2014-03-04 NOTE — Anesthesia Procedure Notes (Signed)
Procedure Name: LMA Insertion Date/Time: 03/04/2014 2:03 PM Performed by: Minus LibertyAVENEL, Bralon Antkowiak Pre-anesthesia Checklist: Patient identified, Timeout performed, Emergency Drugs available, Suction available and Patient being monitored Patient Re-evaluated:Patient Re-evaluated prior to inductionOxygen Delivery Method: Circle system utilized Preoxygenation: Pre-oxygenation with 100% oxygen Intubation Type: IV induction LMA: LMA inserted LMA Size: 4.0 Placement Confirmation: positive ETCO2,  CO2 detector and breath sounds checked- equal and bilateral Tube secured with: Tape Dental Injury: Teeth and Oropharynx as per pre-operative assessment

## 2014-03-04 NOTE — Anesthesia Preprocedure Evaluation (Addendum)
Anesthesia Evaluation  Patient identified by MRN, date of birth, ID band Patient awake    Reviewed: Allergy & Precautions, NPO status , Patient's Chart, lab work & pertinent test results, reviewed documented beta blocker date and time   Airway Mallampati: I       Dental  (+) Teeth Intact   Pulmonary neg pulmonary ROS,          Cardiovascular hypertension, Pt. on medications  EKG 07/2013 WNL   Neuro/Psych negative neurological ROS  negative psych ROS   GI/Hepatic negative GI ROS, Neg liver ROS,   Endo/Other  negative endocrine ROS  Renal/GU negative Renal ROSGFR 74     Musculoskeletal   Abdominal   Peds  Hematology negative hematology ROS (+)   Anesthesia Other Findings   Reproductive/Obstetrics                            Anesthesia Physical Anesthesia Plan  ASA: II  Anesthesia Plan: General   Post-op Pain Management: MAC Combined w/ Regional for Post-op pain   Induction: Intravenous  Airway Management Planned: LMA and Oral ETT  Additional Equipment:   Intra-op Plan:   Post-operative Plan: Extubation in OR  Informed Consent: I have reviewed the patients History and Physical, chart, labs and discussed the procedure including the risks, benefits and alternatives for the proposed anesthesia with the patient or authorized representative who has indicated his/her understanding and acceptance.     Plan Discussed with:   Anesthesia Plan Comments:         Anesthesia Quick Evaluation

## 2014-03-04 NOTE — Progress Notes (Signed)
Emptied 300 cc bloody drainage from hemovac after being in PACU for 90 min.  Hemovac clamped for 4 hr.

## 2014-03-04 NOTE — Transfer of Care (Signed)
Immediate Anesthesia Transfer of Care Note  Patient: Jermaine NordmannJeffrey D Cartmell  Procedure(s) Performed: Procedure(s): TOTAL KNEE ARTHROPLASTY (Right)  Patient Location: PACU  Anesthesia Type:General  Level of Consciousness: awake, alert  and oriented  Airway & Oxygen Therapy: Patient Spontanous Breathing and Patient connected to nasal cannula oxygen  Post-op Assessment: Report given to PACU RN, Post -op Vital signs reviewed and stable and Patient moving all extremities X 4  Post vital signs: Reviewed and stable  Complications: No apparent anesthesia complications

## 2014-03-04 NOTE — Anesthesia Postprocedure Evaluation (Signed)
  Anesthesia Post-op Note  Patient: Jermaine Stewart  Procedure(s) Performed: Procedure(s): TOTAL KNEE ARTHROPLASTY (Right)  Patient Location: PACU  Anesthesia Type: General   Level of Consciousness: awake, alert  and oriented  Airway and Oxygen Therapy: Patient Spontanous Breathing  Post-op Pain: moderate   Post-op Assessment: Post-op Vital signs reviewed  Post-op Vital Signs: Reviewed  Last Vitals:  Filed Vitals:   03/04/14 1700  BP:   Pulse:   Temp: 36.9 C  Resp:     Complications: No apparent anesthesia complications

## 2014-03-05 LAB — CBC
HCT: 32.2 % — ABNORMAL LOW (ref 39.0–52.0)
HEMOGLOBIN: 11 g/dL — AB (ref 13.0–17.0)
MCH: 31.2 pg (ref 26.0–34.0)
MCHC: 34.2 g/dL (ref 30.0–36.0)
MCV: 91.2 fL (ref 78.0–100.0)
Platelets: 272 10*3/uL (ref 150–400)
RBC: 3.53 MIL/uL — ABNORMAL LOW (ref 4.22–5.81)
RDW: 12.4 % (ref 11.5–15.5)
WBC: 10.4 10*3/uL (ref 4.0–10.5)

## 2014-03-05 NOTE — Evaluation (Signed)
Occupational Therapy Evaluation Patient Details Name: Jermaine NordmannJeffrey D Stewart MRN: [id number] DOB: [birth year] Today's Date: 03/05/2014    History of Present Illness 54 yo male with L TKA for endstage OA, prediabetic and HTN in Hx.   Clinical Impression   Pt admitted with the above diagnoses and seen for acute OT evaluation. PTA pt was independent with ADLs. Pt currently at min guard level for ADLs. Pt will have family supervision/assist at d/c. ADL education and compensatory techniques reviewed with pt and daughter. Pt performed bed mobility with HOB flat, in room ambulation to sink where he completed grooming in standing position as detailed below. Pt with good recall of ADL techniques from L TKA. No further OT needs at this time.     Follow Up Recommendations  Supervision/Assistance - 24 hour;No OT follow up    Equipment Recommendations       Recommendations for Other Services       Precautions / Restrictions Precautions Precautions: Knee Precaution Booklet Issued: Yes (comment) Precaution Comments: reviewed Restrictions Weight Bearing Restrictions: Yes RLE Weight Bearing: Weight bearing as tolerated      Mobility Bed Mobility Overal bed mobility: Needs Assistance Bed Mobility: Supine to Sit;Sit to Supine     Supine to sit: Min guard Sit to supine: Min guard   General bed mobility comments: HOB flat; able to advance RLE without physical assist this session  Transfers Overall transfer level: Needs assistance Equipment used: Rolling walker (2 wheeled) Transfers: Sit to/from Stand Sit to Stand: Min guard Stand pivot transfers: Min guard       General transfer comment: cues for technique    Balance Overall balance assessment: Needs assistance Sitting-balance support: Feet supported Sitting balance-Leahy Scale: Good   Postural control: Posterior lean Standing balance support: Bilateral upper extremity supported;During functional activity Standing balance-Leahy  Scale: Fair Standing balance comment: able to complete grooming tasks standing at sink with rw                            ADL Overall ADL's : Needs assistance/impaired Eating/Feeding: Set up;Sitting   Grooming: Standing;Supervision/safety   Upper Body Bathing: Set up;Sitting   Lower Body Bathing: Min guard;Sit to/from stand   Upper Body Dressing : Set up;Sitting   Lower Body Dressing: Min guard;Sit to/from stand   Toilet Transfer: Ambulation;Min guard   Toileting- ArchitectClothing Manipulation and Hygiene: Min guard;Sit to/from stand   Tub/ Shower Transfer: Min guard;Ambulation;3 in 1;Rolling walker   Functional mobility during ADLs: Min guard;Rolling walker General ADL Comments: Reviewed ADL education. Pt performed bed mobility with HOB flat, in room ambulation to sink where he completed grooming in standing position. Pt with good recall of ADL techniques from L TKA.      Vision                     Perception     Praxis      Pertinent Vitals/Pain Pain Assessment: 0-10 Pain Score: 5  Faces Pain Scale: Hurts little more Pain Location: R knee Pain Descriptors / Indicators: Aching Pain Intervention(s): Monitored during session;Premedicated before session     Hand Dominance Right   Extremity/Trunk Assessment Upper Extremity Assessment Upper Extremity Assessment: Overall WFL for tasks assessed   Lower Extremity Assessment Lower Extremity Assessment: Defer to PT evaluation .   Cervical / Trunk Assessment Cervical / Trunk Assessment: Normal   Communication Communication Communication: No difficulties   Cognition Arousal/Alertness: Awake/alert Behavior During Therapy:  WFL for tasks assessed/performed Overall Cognitive Status: Within Functional Limits for tasks assessed                     General Comments       Exercises      Shoulder Instructions      Home Living Family/patient expects to be discharged to:: Private  residence Living Arrangements: Spouse/significant other;Children Available Help at Discharge: Available 24 hours/day Type of Home: House Home Access: Stairs to enter [employer] of Steps: 2 Entrance Stairs-Rails: None Home Layout: One level     Bathroom Shower/Tub: Walk-in shower;Door   [employer] Toilet: Standard     Home Equipment: Environmental consultant - 2 wheels;Bedside commode;Other (comment)          Prior Functioning/Environment Level of Independence: Independent        Comments: work for [employer]    OT Diagnosis: Acute pain   OT Problem List: Impaired balance (sitting and/or standing);Decreased knowledge of use of DME or AE;Decreased knowledge of precautions;Pain   OT Treatment/Interventions:      OT Goals(Current goals can be found in the care plan section) Acute Rehab OT Goals Patient Stated Goal: to get home Time For Goal Achievement: March 17, 2014  OT Frequency:     Barriers to D/C:            Co-evaluation              End of Session Equipment Utilized During Treatment: Rolling walker CPM Right Knee CPM Right Knee: Off  Activity Tolerance: Patient tolerated treatment well Patient left: in bed;with call bell/phone within reach;with family/visitor present   Time: 1223-1234 OT Time Calculation (min): 11 min Charges:  OT General Charges $OT Visit: 1 Procedure OT Evaluation $Initial OT Evaluation Tier I: 1 Procedure OT Treatments $Self Care/Home Management : 8-22 mins G-Codes:    Pilar Grammes 03-17-14, 12:45 PM

## 2014-03-05 NOTE — Plan of Care (Signed)
Problem: Acute Rehab PT Goals(only PT should resolve) Goal: Pt Will Go Supine/Side To Sit Without bedrails Goal: Pt Will Transfer Bed To Chair/Chair To Bed On LRAD Goal: Pt Will Ambulate Level surfaces Goal: Pt Will Go Up/Down Stairs Without rails

## 2014-03-05 NOTE — Progress Notes (Signed)
CARE MANAGEMENT NOTE 03/05/2014  Patient:  Jermaine Stewart,Jermaine Stewart   Account Number:  [id number]  Date Initiated:  03/05/2014  Documentation initiated by:  [facility] LLCKRIEG,Torin Modica  Subjective/Objective Assessment:   s/p right TKA     Action/Plan:   PT/OT evals- recommended HHPT  MD ordered HHPT, Ot and RN   Anticipated DC Date:  03/06/2014   Anticipated DC Plan:  HOME W HOME HEALTH SERVICES      DC Planning Services  CM consult      PAC Choice  DURABLE MEDICAL EQUIPMENT  HOME HEALTH   Choice offered to / List presented to:  C-1 Patient   DME arranged  CPM      DME agency  [employer]     HH arranged  HH-1 RN  HH-2 PT  HH-3 OT      [facility]   Status of service:  Completed, signed off Medicare Important Message given?   (If response is "NO", the following Medicare IM given date fields will be blank) Date Medicare IM given:   Medicare IM given by:   Date Additional Medicare IM given:   Additional Medicare IM given by:    Discharge Disposition:  HOME W HOME HEALTH SERVICES  Per UR Regulation:  Reviewed for med. necessity/level of care/duration of stay  If discussed at Long Length of Stay Meetings, dates discussed:    Comments:  03/05/14 Set up with [facility] for HHPT, OT and RN by MD office. Spoke with patient and wife, no change in Stewart/c plan. [employer] has provided CPM at home. They have a rolling walker and 3N1 already at home. No other Stewart/c needs identified. Jacquelynn CreeMary Larsen Zettel RN, BSN, CCM

## 2014-03-05 NOTE — Evaluation (Signed)
Physical Therapy Evaluation Patient Details Name: Jermaine Stewart MRN: [id number] DOB: May 01, 1960 Today's Date: 03/05/2014   History of Present Illness  54 yo male with L TKA for endstage OA, prediabetic and HTN in Hx.  Clinical Impression  Pt was seen for initial visit with wife present to instruct and observe pt with gait.  He was given a handout for TKA and reminded of the precautions but will review there ex in afternoon session.  Has good performance with cues for gait and will prompt as needed and take on steps when ready.    Follow Up Recommendations Home health PT;Supervision/Assistance - 24 hour    Equipment Recommendations  None recommended by PT    Recommendations for Other Services       Precautions / Restrictions Precautions Precautions: Knee Precaution Booklet Issued: Yes (comment) Precaution Comments: will review this PM  Restrictions Weight Bearing Restrictions: Yes RLE Weight Bearing: Weight bearing as tolerated      Mobility  Bed Mobility Overal bed mobility: Needs Assistance Bed Mobility: Supine to Sit     Supine to sit: Min assist (for LLE)     General bed mobility comments: used bedrail and HOB minimally elevated  Transfers Overall transfer level: Needs assistance Equipment used: Rolling walker (2 wheeled) Transfers: Sit to/from UGI CorporationStand;Stand Pivot Transfers Sit to Stand: Min assist Stand pivot transfers: Min guard       General transfer comment: Pt using walker with verbal cues mainly to sequence and avoid LOB  Ambulation/Gait Ambulation/Gait assistance: Min guard Ambulation Distance (Feet): 175 Feet Assistive device: Rolling walker (2 wheeled) Gait Pattern/deviations: Step-through pattern;Step-to pattern;Decreased stride length;Decreased weight shift to left;Antalgic;Trunk flexed;Wide base of support Gait velocity: reduced and faster Gait velocity interpretation: at or above normal speed for age/gender General Gait Details: Gave pt cues  to avoid using walker in fast pace to sequence and control balance better.  Cued pattern to avoid loss of balance backward and "startled" LOB  Stairs            Wheelchair Mobility    Modified Rankin (Stroke Patients Only)       Balance Overall balance assessment: Needs assistance Sitting-balance support: Feet supported Sitting balance-Leahy Scale: Good   Postural control: Posterior lean Standing balance support: Bilateral upper extremity supported Standing balance-Leahy Scale: Fair Standing balance comment: fair- dynamic balance                             Pertinent Vitals/Pain Pain Assessment: Faces Pain Score: 4  Faces Pain Scale: Hurts little more Pain Location: L knee Pain Intervention(s): Limited activity within patient's tolerance;Monitored during session;RN gave pain meds during session;Repositioned    Home Living Family/patient expects to be discharged to:: Private residence Living Arrangements: Spouse/significant other;Children Available Help at Discharge: Available 24 hours/day Type of Home: House Home Access: Stairs to enter Entrance Stairs-Rails: None Entrance Stairs-Number of Steps: 2 Home Layout: One level Home Equipment: Environmental consultantWalker - 2 wheels;Bedside commode;Other (comment)      Prior Function Level of Independence: Independent         Comments: work for Public house manager[employer]     Hand Dominance   Dominant Hand: Right    Extremity/Trunk Assessment   Upper Extremity Assessment: Overall WFL for tasks assessed           Lower Extremity Assessment: LLE deficits/detail   LLE Deficits / Details: Has new L knee TKA with flexion loss and minor ext loss, mod edema  and in ace wrap the length of LE which prohibits seeing skin.  Drain removed, foley out.  Cervical / Trunk Assessment: Normal  Communication   Communication: No difficulties  Cognition Arousal/Alertness: Awake/alert Behavior During Therapy: WFL for tasks  assessed/performed Overall Cognitive Status: Within Functional Limits for tasks assessed                      General Comments General comments (skin integrity, edema, etc.): Pt is asking about going to [facility] and brought nursing in to assist with catching volume as he just had his foley removed    Exercises        Assessment/Plan    PT Assessment Patient needs continued PT services  PT Diagnosis Difficulty walking   PT Problem List Decreased strength;Decreased range of motion;Decreased activity tolerance;Decreased balance;Decreased mobility;Decreased coordination;Decreased knowledge of use of DME;Decreased safety awareness;Decreased skin integrity;Pain  PT Treatment Interventions DME instruction;Gait training;Stair training;Functional mobility training;Therapeutic activities;Therapeutic exercise;Balance training;Neuromuscular re-education;Patient/family education   PT Goals (Current goals can be found in the Care Plan section) Acute Rehab PT Goals Patient Stated Goal: to get home PT Goal Formulation: With patient/family Time For Goal Achievement: 03/19/14 Potential to Achieve Goals: Good    Frequency 7X/week   Barriers to discharge Inaccessible home environment      Co-evaluation               End of Session Equipment Utilized During Treatment: Other (comment) (FWW) Activity Tolerance: Patient tolerated treatment well Patient left: in chair;with call bell/phone within reach;with nursing/sitter in room;with family/visitor present Nurse Communication: Mobility status         Time: 7846-9629 PT Time Calculation (min) (ACUTE ONLY): 25 min   Charges:   PT Evaluation $Initial PT Evaluation Tier I: 1 Procedure PT Treatments $Gait Training: 8-22 mins   PT G CodesIvar Drape March 31, 2014, 11:00 AM   Samul Dada, PT MS Acute Rehab Dept. Number: [phone]

## 2014-03-05 NOTE — Progress Notes (Signed)
Patient ID: Jermaine Stewart, male   DOB: 10/12/1960, [age] y.o.   MRN: [id number] PATIENT ID: Jermaine Stewart  MRN: [id number]  DOB/AGE:  10/12/1960 / 54 y.o.  1 Day Post-Op Procedure(s) (LRB): TOTAL KNEE ARTHROPLASTY (Right)    PROGRESS NOTE Subjective: Patient is alert, oriented, no Nausea, no Vomiting, yes passing gas, no Bowel Movement. Taking PO well, had i&o cath. Denies SOB, Chest or Calf Pain. Using Incentive Spirometer, PAS in place. Ambulate WBAT, CPM 0-40 Patient reports pain as 3 on 0-10 scale  .    Objective: Vital signs in last 24 hours: Filed Vitals:   03/04/14 1700 03/04/14 1747 03/04/14 2144 03/05/14 0238  BP: 144/87 149/91 140/88 109/65  Pulse: 106 111 96 89  Temp: 98.5 F (36.9 C) 98.5 F (36.9 C) 98.1 F (36.7 C) 98.6 F (37 C)  TempSrc:      Resp: 25 18 16 16   Weight:      SpO2: 97%  97% 99%      Intake/Output from previous day: I/O last 3 completed shifts: In: 1195 [P.O.:120; I.V.:1075] Out: 1050 [Urine:750; Drains:300]   Intake/Output this shift:     LABORATORY DATA:  Recent Labs  03/05/14 0444  WBC 10.4  HGB 11.0*  HCT 32.2*  PLT 272    Examination: Neurologically intact ABD soft Neurovascular intact Sensation intact distally Intact pulses distally Dorsiflexion/Plantar flexion intact Incision: dressing C/D/I No cellulitis present Compartment soft} Blood and plasma separated in drain indicating minimal recent drainage, drain pulled without difficulty.  Assessment:   1 Day Post-Op Procedure(s) (LRB): TOTAL KNEE ARTHROPLASTY (Right) ADDITIONAL DIAGNOSIS: Expected Acute Blood Loss Anemia, Hypertension  Plan: PT/OT WBAT, CPM 5/hrs day until ROM 0-90 degrees, then D/C CPM DVT Prophylaxis:  SCDx72hrs, ASA 325 mg BID x 2 weeks DISCHARGE PLAN: Home, when passes PT DISCHARGE NEEDS: HHPT, HHRN, CPM, Walker and 3-in-1 comode seat     Princesa Willig J 03/05/2014, 7:07 AM

## 2014-03-05 NOTE — Progress Notes (Signed)
PT Cancellation Note  Patient Details Name: Jermaine NordmannJeffrey D Stewart MRN: [id number] DOB: 10/07/1960   Cancelled Treatment:    Reason Eval/Treat Not Completed: Fatigue/lethargy limiting ability to participate;Pain limiting ability to participate;Other (comment) (states he already walked a lot with OT, refused x 2)   Ivar DrapeStout, Roby Spalla E 03/05/2014, 6:08 PM   Samul Dadauth Racquelle Hyser, PT MS Acute Rehab Dept. Number: [phone]

## 2014-03-06 LAB — CBC
HCT: 30.8 % — ABNORMAL LOW (ref 39.0–52.0)
Hemoglobin: 10.7 g/dL — ABNORMAL LOW (ref 13.0–17.0)
MCH: 31.8 pg (ref 26.0–34.0)
MCHC: 34.7 g/dL (ref 30.0–36.0)
MCV: 91.7 fL (ref 78.0–100.0)
PLATELETS: 186 10*3/uL (ref 150–400)
RBC: 3.36 MIL/uL — ABNORMAL LOW (ref 4.22–5.81)
RDW: 12.3 % (ref 11.5–15.5)
WBC: 6.9 10*3/uL (ref 4.0–10.5)

## 2014-03-06 MED ORDER — TAMSULOSIN HCL 0.4 MG PO CAPS
0.4000 mg | ORAL_CAPSULE | Freq: Every day | ORAL | Status: DC
  Administered 2014-03-06: 0.4 mg via ORAL
  Filled 2014-03-06 (×2): qty 1

## 2014-03-06 NOTE — Progress Notes (Signed)
Patient was discharged home, via wheelchair and AVS, with his daughter and father.

## 2014-03-06 NOTE — Progress Notes (Signed)
PATIENT ID: Jermaine Stewart  MRN: [id number]  DOB/AGE:  1960/06/19 / 54 y.o.  2 Days Post-Op Procedure(s) (LRB): TOTAL KNEE ARTHROPLASTY (Right)    PROGRESS NOTE Subjective: Patient is alert, oriented, no Nausea, no Vomiting, yes passing gas, no Bowel Movement. Taking PO with small bites. Denies SOB, Chest or Calf Pain. Using Incentive Spirometer, PAS in place. Ambulate WBAT with pt walking 175 ft with therapy, CPM 0-60 Patient reports pain as 3 on 0-10 scale.    Objective: Vital signs in last 24 hours: Filed Vitals:   03/06/14 0000 03/06/14 0400 03/06/14 0643 03/06/14 0800  BP:   117/66   Pulse:   107   Temp:   98.4 F (36.9 C)   TempSrc:   Tympanic   Resp: 19 17 16 18   Weight:      SpO2:   100%       Intake/Output from previous day: I/O last 3 completed shifts: In: 2850 [P.O.:1000; I.V.:1850] Out: 7100 [Urine:7100]   Intake/Output this shift:     LABORATORY DATA:  Recent Labs  03/05/14 0444 03/06/14 0538  WBC 10.4 6.9  HGB 11.0* 10.7*  HCT 32.2* 30.8*  PLT 272 186    Examination: Neurologically intact Neurovascular intact Sensation intact distally Intact pulses distally Dorsiflexion/Plantar flexion intact Incision: dressing C/D/I No cellulitis present Compartment soft}  Assessment:   2 Days Post-Op Procedure(s) (LRB): TOTAL KNEE ARTHROPLASTY (Right) ADDITIONAL DIAGNOSIS: Expected Acute Blood Loss Anemia, Hypertension  Plan: PT/OT WBAT, CPM 5/hrs day until ROM 0-90 degrees, then D/C CPM DVT Prophylaxis:  SCDx72hrs, ASA 325 mg BID x 2 weeks DISCHARGE PLAN: Home, when pt able to urinate on his own. DISCHARGE NEEDS: HHPT, HHRN, Walker and 3-in-1 comode seat     PHILLIPS, ERIC R 03/06/2014, 8:38 AM

## 2014-03-06 NOTE — Progress Notes (Signed)
Physical Therapy Treatment Patient Details Name: RUCKER PRIDGEON MRN: [id number] DOB: Jul 10, 1960 Today's Date: 03/06/2014    History of Present Illness 54 yo male with L TKA for endstage OA, prediabetic and HTN in Hx.    PT Comments    Pt was seen for a short visit to review gati on hallway with good return on demonstration and instruction for pattern, along with stairs with B rails.  He is planning on going home and reviewed a stretch to R knee as his personal best is about 70 deg flexion right now.  He is instructed on breathing to relax and to control the discomfort of the stretch to avoid reflex spasm.  HHPT to follow up when he discharges.  Follow Up Recommendations  Home health PT;Supervision/Assistance - 24 hour     Equipment Recommendations  None recommended by PT    Recommendations for Other Services       Precautions / Restrictions Precautions Precautions: Knee Precaution Booklet Issued: Yes (comment) Precaution Comments: reviewed Restrictions Weight Bearing Restrictions: Yes RLE Weight Bearing: Weight bearing as tolerated    Mobility  Bed Mobility Overal bed mobility: Modified Independent Bed Mobility: Supine to Sit     Supine to sit: Modified independent (Device/Increase time)     General bed mobility comments: HOB minimally elevated and used no bedrails  Transfers Overall transfer level: Modified independent Equipment used: Rolling walker (2 wheeled) Transfers: Sit to/from [employer] Sit to Stand: Modified independent (Device/Increase time) Stand pivot transfers: Modified independent (Device/Increase time)       General transfer comment: cues for technique  Ambulation/Gait Ambulation/Gait assistance: Supervision;Min guard Ambulation Distance (Feet): 225 Feet Assistive device: Rolling walker (2 wheeled) Gait Pattern/deviations: Step-through pattern;Decreased step length - left;Decreased stance time - right;Decreased weight shift  to right;Trunk flexed;Wide base of support Gait velocity: controlled verbally Gait velocity interpretation: at or above normal speed for age/gender General Gait Details: cued pt to slow down and step longer on L, shorter on R to get more hip ext, which brought R heel down onto floor better.   Stairs Stairs: Yes Stairs assistance: Supervision Stair Management: Two rails;Step to pattern;Forwards Number of Stairs: 10 General stair comments: reminded pt of sequence and effort to control speed and  control amount of time on each limb as he steps down-pt is demonstrating more controlled effort and better distribution of wgt  Wheelchair Mobility    Modified Rankin (Stroke Patients Only)       Balance Overall balance assessment: Needs assistance Sitting-balance support: Feet supported Sitting balance-Leahy Scale: Good   Postural control: Posterior lean Standing balance support: Bilateral upper extremity supported Standing balance-Leahy Scale: Fair Standing balance comment: limits wbing on R LE                    Cognition Arousal/Alertness: Awake/alert Behavior During Therapy: WFL for tasks assessed/performed Overall Cognitive Status: Within Functional Limits for tasks assessed                      Exercises      General Comments General comments (skin integrity, edema, etc.): Pt is demonstrating a limited effort to stand on RLE until corrected but flows into a better pattern.  He will need to make this a big focus on HHPT as he will not be in SNF where more time can be invested in therapy      Pertinent Vitals/Pain Pain Assessment: 0-10 Pain Score: 6  Pain Location: R knee  Pain Intervention(s): Limited activity within patient's tolerance;Monitored during session;Premedicated before session;Repositioned;Utilized relaxation techniques;Ice applied;Relaxation    Home Living                      Prior Function            PT Goals (current goals can  now be found in the care plan section) Acute Rehab PT Goals Patient Stated Goal: home safely Progress towards PT goals: Progressing toward goals    Frequency  7X/week    PT Plan Current plan remains appropriate    Co-evaluation             End of Session Equipment Utilized During Treatment: Gait belt;Other (comment) (FWW) Activity Tolerance: Patient tolerated treatment well Patient left: in chair;with call bell/phone within reach;with nursing/sitter in room;with family/visitor present     Time: 1010-1038 PT Time Calculation (min) (ACUTE ONLY): 28 min  Charges:  $Gait Training: 8-22 mins $Therapeutic Exercise: 8-22 mins                    G Codes:      Ivar DrapeStout, Vertis Scheib E 03/06/2014, 11:50 AM  Samul Dadauth Harlem Bula, PT MS Acute Rehab Dept. Number: [phone]

## 2014-03-06 NOTE — Discharge Summary (Signed)
Patient ID: Jermaine Stewart MRN: [id number] DOB/AGE: 1960/06/29 54 y.o.  Admit date: 03/04/2014 Discharge date: 03/06/2014  Admission Diagnoses:  Active Problems:   Osteoarthritis of right knee   Discharge Diagnoses:  Same  Past Medical History  Diagnosis Date  . Hyperlipidemia   . Unspecified vitamin D deficiency     states he takes Vit D just as prevention  . Hypertension   . Arthritis     Surgeries: Procedure(s): TOTAL KNEE ARTHROPLASTY on 03/04/2014   Consultants:    Discharged Condition: Improved  Hospital Course: Jermaine Stewart is an 54 y.o. male who was admitted 03/04/2014 for operative treatment of<principal problem not specified>. Patient has severe unremitting pain that affects sleep, daily activities, and work/hobbies. After pre-op clearance the patient was taken to the operating room on 03/04/2014 and underwent  Procedure(s): TOTAL KNEE ARTHROPLASTY.    Patient was given perioperative antibiotics: Anti-infectives    Start     Dose/Rate Route Frequency Ordered Stop   03/04/14 1440  cefUROXime (ZINACEF) injection  Status:  Discontinued       As needed 03/04/14 1440 03/04/14 1551   03/04/14 0600  ceFAZolin (ANCEF) IVPB 2 g/50 mL premix     2 g100 mL/hr over 30 Minutes Intravenous On call to O.R. 03/03/14 1431 03/04/14 1358       Patient was given sequential compression devices, early ambulation, and chemoprophylaxis to prevent DVT.  Patient benefited maximally from hospital stay and there were no complications.    Recent vital signs: Patient Vitals for the past 24 hrs:  BP Temp Temp src Pulse Resp SpO2  03/06/14 1145 - - - - 16 -  03/06/14 0800 - - - - 18 -  03/06/14 0643 117/66 mmHg 98.4 F (36.9 C) Tympanic (!) 107 16 100 %  03/06/14 0400 - - - - 17 -  03/06/14 0000 - - - - 19 -  03/05/14 2055 119/71 mmHg 98.7 F (37.1 C) Oral (!) 110 18 98 %  03/05/14 2000 - - - - 18 -  03/05/14 1506 118/62 mmHg 98.9 F (37.2 C) - (!) 101 18 100 %     Recent  laboratory studies:  Recent Labs  03/05/14 0444 03/06/14 0538  WBC 10.4 6.9  HGB 11.0* 10.7*  HCT 32.2* 30.8*  PLT 272 186     Discharge Medications:     Medication List    STOP taking these medications        meloxicam 15 MG tablet  Commonly known as:  MOBIC     oxyCODONE 5 MG immediate release tablet  Commonly known as:  Oxy IR/ROXICODONE      TAKE these medications        aspirin EC 325 MG tablet  Take 1 tablet (325 mg total) by mouth 2 (two) times daily.     azithromycin 250 MG tablet  Commonly known as:  ZITHROMAX  2 tablets by mouth today then one tablet daily for 4 days.     lisinopril-hydrochlorothiazide 20-25 MG per tablet  Commonly known as:  PRINZIDE,ZESTORETIC  TAKE 1 TABLET DAILY FOR BLOOD PRESSURE     methocarbamol 500 MG tablet  Commonly known as:  ROBAXIN  Take 1 tablet (500 mg total) by mouth 2 (two) times daily with a meal.     oxyCODONE-acetaminophen 5-325 MG per tablet  Commonly known as:  ROXICET  Take 1 tablet by mouth every 4 (four) hours as needed.     POTASSIUM PO  Take 1 tablet  by mouth daily.     Vitamin D-3 5000 UNITS Tabs  Take 15,000 Units by mouth daily. 2-3 PO QD        Diagnostic Studies: No results found.  Disposition: 06-Home-Health Care Svc      Discharge Instructions    CPM    Complete by:  As directed   Continuous passive motion machine (CPM):      Use the CPM from 0 to 60  for 5 hours per day.      You may increase by 10 degrees per day.  You may break it up into 2 or 3 sessions per day.      Use CPM for 2 weeks or until you are told to stop.     Call MD / Call 911    Complete by:  As directed   If you experience chest pain or shortness of breath, CALL 911 and be transported to the hospital emergency room.  If you develope a fever above 101 F, pus (white drainage) or increased drainage or redness at the wound, or calf pain, call your surgeon's office.     Change dressing    Complete by:  As directed    Change dressing on day 5, then change the dressing daily with sterile 4 x 4 inch gauze dressing.  You may clean the incision with alcohol prior to redressing.     Constipation Prevention    Complete by:  As directed   Drink plenty of fluids.  Prune juice may be helpful.  You may use a stool softener, such as Colace (over the counter) 100 mg twice a day.  Use MiraLax (over the counter) for constipation as needed.     Diet - low sodium heart healthy    Complete by:  As directed      Discharge instructions    Complete by:  As directed   Follow up in office with Dr. Turner Danielsowan in 2 weeks.     Driving restrictions    Complete by:  As directed   No driving for 2 weeks     Increase activity slowly as tolerated    Complete by:  As directed      Patient may shower    Complete by:  As directed   You may shower without a dressing once there is no drainage.  Do not wash over the wound.  If drainage remains, cover wound with plastic wrap and then shower.           Follow-up Information    Follow up with Nestor LewandowskyOWAN,FRANK J, MD In 2 weeks.   Specialty:  Orthopedic Surgery   Contact information:   [address] [place] [id number] [phone]       Follow up with [facility].   Why:  They will contact you to schedule home therapy visits.   Contact information:   [address] [place] [address] [phone]        Signed: Henry RusselHILLIPS, Rashaunda Rahl R 03/06/2014, 12:53 PM

## 2014-03-10 ENCOUNTER — Encounter (HOSPITAL_COMMUNITY): Payer: Self-pay | Admitting: Orthopedic Surgery

## 2014-03-23 ENCOUNTER — Encounter: Payer: Commercial Managed Care - PPO | Admitting: Internal Medicine

## 2014-07-27 ENCOUNTER — Encounter: Payer: Self-pay | Admitting: Internal Medicine

## 2014-08-04 ENCOUNTER — Ambulatory Visit: Payer: Self-pay | Admitting: Internal Medicine

## 2014-08-06 ENCOUNTER — Other Ambulatory Visit: Payer: Self-pay | Admitting: Internal Medicine

## 2014-09-02 ENCOUNTER — Ambulatory Visit (INDEPENDENT_AMBULATORY_CARE_PROVIDER_SITE_OTHER): Payer: Commercial Managed Care - PPO | Admitting: Internal Medicine

## 2014-09-02 ENCOUNTER — Encounter: Payer: Self-pay | Admitting: Internal Medicine

## 2014-09-02 ENCOUNTER — Ambulatory Visit: Payer: Self-pay | Admitting: Internal Medicine

## 2014-09-02 VITALS — BP 128/64 | HR 90 | Temp 98.2°F | Resp 18 | Ht 69.5 in | Wt 165.0 lb

## 2014-09-02 DIAGNOSIS — IMO0001 Reserved for inherently not codable concepts without codable children: Secondary | ICD-10-CM

## 2014-09-02 DIAGNOSIS — I10 Essential (primary) hypertension: Secondary | ICD-10-CM

## 2014-09-02 DIAGNOSIS — E785 Hyperlipidemia, unspecified: Secondary | ICD-10-CM

## 2014-09-02 DIAGNOSIS — R7303 Prediabetes: Secondary | ICD-10-CM

## 2014-09-02 DIAGNOSIS — Z789 Other specified health status: Secondary | ICD-10-CM

## 2014-09-02 DIAGNOSIS — E559 Vitamin D deficiency, unspecified: Secondary | ICD-10-CM

## 2014-09-02 DIAGNOSIS — R7309 Other abnormal glucose: Secondary | ICD-10-CM

## 2014-09-02 DIAGNOSIS — Z79899 Other long term (current) drug therapy: Secondary | ICD-10-CM

## 2014-09-02 LAB — CBC WITH DIFFERENTIAL/PLATELET
BASOS ABS: 0 10*3/uL (ref 0.0–0.1)
Basophils Relative: 0 % (ref 0–1)
EOS ABS: 0.2 10*3/uL (ref 0.0–0.7)
Eosinophils Relative: 4 % (ref 0–5)
HCT: 39.7 % (ref 39.0–52.0)
Hemoglobin: 13.4 g/dL (ref 13.0–17.0)
LYMPHS ABS: 1.4 10*3/uL (ref 0.7–4.0)
LYMPHS PCT: 28 % (ref 12–46)
MCH: 30.9 pg (ref 26.0–34.0)
MCHC: 33.8 g/dL (ref 30.0–36.0)
MCV: 91.5 fL (ref 78.0–100.0)
MONO ABS: 0.3 10*3/uL (ref 0.1–1.0)
MPV: 9.7 fL (ref 8.6–12.4)
Monocytes Relative: 6 % (ref 3–12)
NEUTROS PCT: 62 % (ref 43–77)
Neutro Abs: 3 10*3/uL (ref 1.7–7.7)
PLATELETS: 214 10*3/uL (ref 150–400)
RBC: 4.34 MIL/uL (ref 4.22–5.81)
RDW: 14.2 % (ref 11.5–15.5)
WBC: 4.9 10*3/uL (ref 4.0–10.5)

## 2014-09-02 MED ORDER — PREDNISONE 20 MG PO TABS: ORAL_TABLET | ORAL | Status: DC

## 2014-09-02 NOTE — Progress Notes (Signed)
Patient ID: Jermaine Stewart, male   DOB: Jun 30, 1960, 54 y.o.   MRN: [id number]  Assessment and Plan:  Hypertension:  -Continue medication, cut med in half -monitor blood pressure at home. Send message in 3-4 weeks -Continue DASH diet.   -Reminder to go to the ER if any CP, SOB, nausea, dizziness, severe HA, changes vision/speech, left arm numbness and tingling, and jaw pain.  Cholesterol: -Continue diet and exercise.  -Check cholesterol.   Pre-diabetes: -Continue diet and exercise.  -Check A1C  Vitamin D Def: -check level -continue medications.   Allergic rhinitis -prednisone -zyrtec/allegra/claritin   Continue diet and meds as discussed. Further disposition pending results of labs.  HPI 54 y.o. male  presents for 3 month follow up with hypertension, hyperlipidemia, prediabetes and vitamin D.   His blood pressure has been controlled at home, today their BP is BP: 128/64 mmHg.   He does workout. He denies chest pain, shortness of breath, dizziness.   He is on cholesterol medication and denies myalgias. His cholesterol is at goal. The cholesterol last visit was:   Lab Results  Component Value Date   CHOL 243* 02/04/2014   HDL 67 02/04/2014   LDLCALC 104* 02/04/2014   TRIG 358* 02/04/2014   CHOLHDL 3.6 02/04/2014     He has been working on diet and exercise for prediabetes, and denies foot ulcerations, hyperglycemia, hypoglycemia , increased appetite, nausea, paresthesia of the feet, polydipsia, polyuria, visual disturbances, vomiting and weight loss. Last A1C in the office was:  Lab Results  Component Value Date   HGBA1C 5.3 07/22/2013    Patient is on Vitamin D supplement.  Lab Results  Component Value Date   VD25OH 36 02/04/2014     He does report some upper respiratory infection.  He reports post nasal drip, coughing, congestion, and sore throat.  Seasonal allergies are an issue for him.   Current Medications:  Current Outpatient Prescriptions on File Prior to  Visit  Medication Sig Dispense Refill  . aspirin EC 325 MG tablet Take 1 tablet (325 mg total) by mouth 2 (two) times daily. 30 tablet 0  . Cholecalciferol (VITAMIN D-3) 5000 UNITS TABS Take 15,000 Units by mouth daily. 2-3 PO QD    . lisinopril-hydrochlorothiazide (PRINZIDE,ZESTORETIC) 20-25 MG per tablet TAKE 1 TABLET DAILY FOR BLOOD PRESSURE 90 tablet 3  . POTASSIUM PO Take 1 tablet by mouth daily.     No current facility-administered medications on file prior to visit.    Medical History:  Past Medical History  Diagnosis Date  . Hyperlipidemia   . Unspecified vitamin D deficiency     states he takes Vit D just as prevention  . Hypertension   . Arthritis     Allergies: No Known Allergies   Review of Systems:  Review of Systems  Constitutional: Negative for fever, chills and malaise/fatigue.  HENT: Negative for congestion, ear pain and sore throat.   Eyes: Negative.   Respiratory: Negative for cough, shortness of breath and wheezing.   Cardiovascular: Negative for chest pain, palpitations and leg swelling.  Gastrointestinal: Negative for heartburn, nausea, vomiting, abdominal pain, diarrhea, constipation, blood in stool and melena.  Musculoskeletal: Negative for joint pain.  Skin: Negative.   Neurological: Negative for dizziness, sensory change, loss of consciousness and headaches.  Psychiatric/Behavioral: Negative for depression. The patient is not nervous/anxious and does not have insomnia.     Family history- Review and unchanged  Social history- Review and unchanged  Physical Exam: BP 128/64 mmHg  Pulse 90  Temp(Src) 98.2 F (36.8 C) (Temporal)  Resp 18  Ht 5' 9.5" (1.765 m)  Wt 165 lb (74.844 kg)  BMI 24.03 kg/m2 Wt Readings from Last 3 Encounters:  09/02/14 165 lb (74.844 kg)  03/04/14 158 lb (71.668 kg)  02/23/14 158 lb 3.2 oz (71.759 kg)    General Appearance: Well nourished well developed, in no apparent distress. Eyes: PERRLA, EOMs, conjunctiva no  swelling or erythema ENT/Mouth: Ear canals normal without obstruction, swelling, erythma, discharge.  TMs normal bilaterally.  Oropharynx moist, clear, without exudate, or postoropharyngeal swelling. Neck: Supple, thyroid normal,no cervical adenopathy  Respiratory: Respiratory effort normal, Breath sounds clear A&P without rhonchi, wheeze, or rale.  No retractions, no accessory usage. Cardio: RRR with no MRGs. Brisk peripheral pulses without edema.  Abdomen: Soft, + BS,  Non tender, no guarding, rebound, hernias, masses. Musculoskeletal: Full ROM, 5/5 strength, Normal gait Skin: Warm, dry without rashes, lesions, ecchymosis.  Neuro: Awake and oriented X 3, Cranial nerves intact. Normal muscle tone, no cerebellar symptoms. Psych: Normal affect, Insight and Judgment appropriate.    Terri Piedra, PA-C 4:44 PM [facility]

## 2014-09-02 NOTE — Patient Instructions (Addendum)
Please cut your blood pressure medication in half and monitor it at home.  You can send me a mychart message in 3-4 weeks with the average of your blood pressure medication.    Please make sure you are wearing your sunscreen.  You can use either cetaphil or ceravae lotion which has spf 35 already in the lotion.  Try to wear hats or protective clothing when you go out.  Please take the prednisone until it is gone.  If you are no better in 3-5 days I will call in an antibiotic.

## 2014-09-03 LAB — HEPATIC FUNCTION PANEL
ALK PHOS: 60 U/L (ref 39–117)
ALT: 34 U/L (ref 0–53)
AST: 26 U/L (ref 0–37)
Albumin: 4.4 g/dL (ref 3.5–5.2)
BILIRUBIN DIRECT: 0.1 mg/dL (ref 0.0–0.3)
Indirect Bilirubin: 0.4 mg/dL (ref 0.2–1.2)
TOTAL PROTEIN: 6.8 g/dL (ref 6.0–8.3)
Total Bilirubin: 0.5 mg/dL (ref 0.2–1.2)

## 2014-09-03 LAB — BASIC METABOLIC PANEL WITH GFR
BUN: 13 mg/dL (ref 6–23)
CALCIUM: 9.8 mg/dL (ref 8.4–10.5)
CO2: 27 meq/L (ref 19–32)
Chloride: 98 mEq/L (ref 96–112)
Creat: 0.85 mg/dL (ref 0.50–1.35)
GFR, Est Non African American: >89 mL/min
Glucose, Bld: 83 mg/dL (ref 70–99)
Potassium: 4.1 mEq/L (ref 3.5–5.3)
SODIUM: 137 meq/L (ref 135–145)

## 2014-09-03 LAB — LIPID PANEL
CHOLESTEROL: 258 mg/dL — AB (ref 0–200)
HDL: 63 mg/dL (ref >=40)
TRIGLYCERIDES: 411 mg/dL — AB (ref <150)
Total CHOL/HDL Ratio: 4.1 Ratio

## 2014-09-03 LAB — MAGNESIUM: MAGNESIUM: 2.1 mg/dL (ref 1.5–2.5)

## 2014-09-03 LAB — HEMOGLOBIN A1C
Hgb A1c MFr Bld: 5.2 % (ref <5.7)
MEAN PLASMA GLUCOSE: 103 mg/dL (ref <117)

## 2014-09-03 LAB — TSH: TSH: 1.071 u[IU]/mL (ref 0.350–4.500)

## 2014-09-03 LAB — VITAMIN D 25 HYDROXY (VIT D DEFICIENCY, FRACTURES): Vit D, 25-Hydroxy: 35 ng/mL (ref 30–100)

## 2014-09-03 LAB — INSULIN, RANDOM: INSULIN: 3.7 u[IU]/mL (ref 2.0–19.6)

## 2014-09-06 ENCOUNTER — Encounter: Payer: Self-pay | Admitting: Internal Medicine

## 2014-09-07 ENCOUNTER — Other Ambulatory Visit: Payer: Self-pay | Admitting: Internal Medicine

## 2014-09-07 MED ORDER — AZITHROMYCIN 250 MG PO TABS: ORAL_TABLET | ORAL | Status: DC

## 2014-09-13 ENCOUNTER — Encounter: Payer: Self-pay | Admitting: Internal Medicine

## 2014-09-14 ENCOUNTER — Other Ambulatory Visit: Payer: Self-pay | Admitting: Internal Medicine

## 2014-09-14 MED ORDER — DOXYCYCLINE HYCLATE 100 MG PO CAPS: 100.0000 mg | ORAL_CAPSULE | Freq: Two times a day (BID) | ORAL | Status: DC

## 2014-11-23 ENCOUNTER — Other Ambulatory Visit: Payer: Self-pay | Admitting: Internal Medicine

## 2014-11-23 DIAGNOSIS — K644 Residual hemorrhoidal skin tags: Secondary | ICD-10-CM

## 2014-11-23 MED ORDER — HYDROCORTISONE 2.5 % RE CREA: TOPICAL_CREAM | RECTAL | Status: DC

## 2014-12-12 ENCOUNTER — Other Ambulatory Visit: Payer: Self-pay | Admitting: Internal Medicine

## 2014-12-20 ENCOUNTER — Encounter: Payer: Self-pay | Admitting: Internal Medicine

## 2014-12-21 ENCOUNTER — Other Ambulatory Visit: Payer: Self-pay | Admitting: Internal Medicine

## 2014-12-21 DIAGNOSIS — K121 Other forms of stomatitis: Secondary | ICD-10-CM

## 2014-12-21 MED ORDER — LIDOCAINE VISCOUS 2 % MT SOLN: 20.0000 mL | OROMUCOSAL | Status: DC | PRN

## 2014-12-21 MED ORDER — VALACYCLOVIR HCL 1 G PO TABS: 1000.0000 mg | ORAL_TABLET | Freq: Two times a day (BID) | ORAL | Status: DC

## 2015-03-09 ENCOUNTER — Ambulatory Visit: Payer: Self-pay | Admitting: Internal Medicine

## 2015-03-25 ENCOUNTER — Encounter: Payer: Self-pay | Admitting: Internal Medicine

## 2015-06-08 ENCOUNTER — Other Ambulatory Visit: Payer: Self-pay | Admitting: Internal Medicine

## 2015-07-19 ENCOUNTER — Encounter: Payer: Self-pay | Admitting: Internal Medicine

## 2015-08-02 ENCOUNTER — Other Ambulatory Visit: Payer: Self-pay | Admitting: Internal Medicine

## 2015-08-11 ENCOUNTER — Other Ambulatory Visit: Payer: Self-pay

## 2015-08-11 ENCOUNTER — Encounter: Payer: Self-pay | Admitting: Physician Assistant

## 2015-08-11 ENCOUNTER — Ambulatory Visit (INDEPENDENT_AMBULATORY_CARE_PROVIDER_SITE_OTHER): Payer: Commercial Managed Care - PPO | Admitting: Physician Assistant

## 2015-08-11 VITALS — BP 124/80 | HR 67 | Temp 98.2°F | Resp 16 | Ht 69.5 in | Wt 168.8 lb

## 2015-08-11 DIAGNOSIS — E785 Hyperlipidemia, unspecified: Secondary | ICD-10-CM | POA: Diagnosis not present

## 2015-08-11 DIAGNOSIS — M1711 Unilateral primary osteoarthritis, right knee: Secondary | ICD-10-CM | POA: Diagnosis not present

## 2015-08-11 DIAGNOSIS — Z79899 Other long term (current) drug therapy: Secondary | ICD-10-CM | POA: Diagnosis not present

## 2015-08-11 DIAGNOSIS — E559 Vitamin D deficiency, unspecified: Secondary | ICD-10-CM

## 2015-08-11 DIAGNOSIS — R7303 Prediabetes: Secondary | ICD-10-CM

## 2015-08-11 DIAGNOSIS — I10 Essential (primary) hypertension: Secondary | ICD-10-CM

## 2015-08-11 LAB — TSH: TSH: 1.36 mIU/L (ref 0.40–4.50)

## 2015-08-11 LAB — CBC WITH DIFFERENTIAL/PLATELET
BASOS PCT: 1 %
Basophils Absolute: 50 cells/uL (ref 0–200)
Eosinophils Absolute: 150 cells/uL (ref 15–500)
Eosinophils Relative: 3 %
HCT: 40.5 % (ref 38.5–50.0)
Hemoglobin: 13.7 g/dL (ref 13.2–17.1)
Lymphocytes Relative: 27 %
Lymphs Abs: 1350 cells/uL (ref 850–3900)
MCH: 30.4 pg (ref 27.0–33.0)
MCHC: 33.8 g/dL (ref 32.0–36.0)
MCV: 90 fL (ref 80.0–100.0)
MONOS PCT: 9 %
MPV: 10 fL (ref 7.5–12.5)
Monocytes Absolute: 450 cells/uL (ref 200–950)
NEUTROS PCT: 60 %
Neutro Abs: 3000 cells/uL (ref 1500–7800)
Platelets: 236 10*3/uL (ref 140–400)
RBC: 4.5 MIL/uL (ref 4.20–5.80)
RDW: 14.3 % (ref 11.0–15.0)
WBC: 5 10*3/uL (ref 3.8–10.8)

## 2015-08-11 MED ORDER — LISINOPRIL-HYDROCHLOROTHIAZIDE 20-25 MG PO TABS: 1.0000 | ORAL_TABLET | Freq: Every day | ORAL | Status: DC

## 2015-08-11 MED ORDER — DICLOFENAC SODIUM ER 100 MG PO TB24
100.0000 mg | ORAL_TABLET | Freq: Every day | ORAL | Status: DC
Start: 2015-08-11 — End: 2015-12-21

## 2015-08-11 NOTE — Progress Notes (Signed)
Patient ID: Jermaine Stewart, male   DOB: 01-May-1960, 55 y.o.   MRN: [id number]  Assessment and Plan:  Hypertension:  -monitor blood pressure at home.  -Continue DASH diet.   -Reminder to go to the ER if any CP, SOB, nausea, dizziness, severe HA, changes vision/speech, left arm numbness and tingling, and jaw pain.  Cholesterol: -Continue diet and exercise.  -Check cholesterol.   Pre-diabetes: -Continue diet and exercise.  -Check A1C  Vitamin D Def: -check level -continue medications.   Counseled on colonoscopy Schedule CPE   Continue diet and meds as discussed. Further disposition pending results of labs.  HPI 55 y.o. male  presents for 3 month follow up with hypertension, hyperlipidemia, prediabetes and vitamin D.   His blood pressure has been controlled at home, he is on a whole one, when he does halves it will crush it, today their BP is BP: 124/80 mmHg.   He does workout. He denies chest pain, shortness of breath, dizziness.   He is on cholesterol medication and denies myalgias. His cholesterol is at goal. The cholesterol last visit was:   Lab Results  Component Value Date   CHOL 258* 09/02/2014   HDL 63 09/02/2014   LDLCALC NOT CALC 09/02/2014   TRIG 411* 09/02/2014   CHOLHDL 4.1 09/02/2014    He has been working on diet and exercise for prediabetes, and denies foot ulcerations, hyperglycemia, hypoglycemia , increased appetite, nausea, paresthesia of the feet, polydipsia, polyuria, visual disturbances, vomiting and weight loss. Last A1C in the office was:  Lab Results  Component Value Date   HGBA1C 5.2 09/02/2014   Patient is on Vitamin D supplement, he is on 15,000 daily.  Lab Results  Component Value Date   VD25OH 35 09/02/2014       Current Medications:  Current Outpatient Prescriptions on File Prior to Visit  Medication Sig Dispense Refill  . aspirin EC 325 MG tablet Take 1 tablet (325 mg total) by mouth 2 (two) times daily. 30 tablet 0  .  Cholecalciferol (VITAMIN D-3) 5000 UNITS TABS Take 15,000 Units by mouth daily. Reported on 08/11/2015    . lisinopril-hydrochlorothiazide (PRINZIDE,ZESTORETIC) 20-25 MG tablet TAKE 1 TABLET DAILY FOR BLOOD PRESSURE 90 tablet 1  . POTASSIUM PO Take 1 tablet by mouth daily.     No current facility-administered medications on file prior to visit.    Medical History:  Past Medical History  Diagnosis Date  . Hyperlipidemia   . Unspecified vitamin D deficiency     states he takes Vit D just as prevention  . Hypertension   . Arthritis     Allergies: No Known Allergies   Review of Systems:  Review of Systems  Constitutional: Negative for fever, chills and malaise/fatigue.  HENT: Negative for congestion, ear pain and sore throat.   Eyes: Negative.   Respiratory: Negative for cough, shortness of breath and wheezing.   Cardiovascular: Negative for chest pain, palpitations and leg swelling.  Gastrointestinal: Negative for heartburn, nausea, vomiting, abdominal pain, diarrhea, constipation, blood in stool and melena.  Musculoskeletal: Negative for joint pain.  Skin: Negative.   Neurological: Negative for dizziness, sensory change, loss of consciousness and headaches.  Psychiatric/Behavioral: Negative for depression. The patient is not nervous/anxious and does not have insomnia.     Family history- Review and unchanged  Social history- Review and unchanged  Physical Exam: BP 124/80 mmHg  Pulse 67  Temp(Src) 98.2 F (36.8 C) (Temporal)  Resp 16  Ht 5' 9.5" (1.765 m)  Wt 168 lb 12.8 oz (76.567 kg)  BMI 24.58 kg/m2  SpO2 97% Wt Readings from Last 3 Encounters:  08/11/15 168 lb 12.8 oz (76.567 kg)  09/02/14 165 lb (74.844 kg)  03/04/14 158 lb (71.668 kg)    General Appearance: Well nourished well developed, in no apparent distress. Eyes: PERRLA, EOMs, conjunctiva no swelling or erythema ENT/Mouth: Ear canals normal without obstruction, swelling, erythma, discharge.  TMs normal  bilaterally.  Oropharynx moist, clear, without exudate, or postoropharyngeal swelling. Neck: Supple, thyroid normal,no cervical adenopathy  Respiratory: Respiratory effort normal, Breath sounds clear A&P without rhonchi, wheeze, or rale.  No retractions, no accessory usage. Cardio: RRR with no MRGs. Brisk peripheral pulses without edema.  Abdomen: Soft, + BS,  Non tender, no guarding, rebound, hernias, masses. Musculoskeletal: Full ROM, 5/5 strength, Normal gait Skin: Warm, dry without rashes, lesions, ecchymosis.  Neuro: Awake and oriented X 3, Cranial nerves intact. Normal muscle tone, no cerebellar symptoms. Psych: Normal affect, Insight and Judgment appropriate.    Quentin MullingAmanda Llewellyn Schoenberger, PA-C 4:26 PM [facility]

## 2015-08-11 NOTE — Patient Instructions (Signed)
Colon cancer is 3rd most diagnosed cancer and 2nd leading cause of death in both men and women 53 years of age and older despite being one of the most preventable and treatable cancers if found early.  4 of out 5 people diagnosed with colon cancer have NO prior family history.  When caught EARLY 90% of colon cancer is curable.   Monitor your blood pressure at home. Go to the ER if any CP, SOB, nausea, dizziness, severe HA, changes vision/speech  Goal BP:  For patients younger than 60: Goal BP < 140/90. For patients 60 and older: Goal BP < 150/90. For patients with diabetes: Goal BP < 140/90. Your most recent BP: BP: 124/80 mmHg   Take your medications faithfully as instructed. Maintain a healthy weight. Get at least 150 minutes of aerobic exercise per week. Minimize salt intake. Minimize alcohol intake  DASH Eating Plan DASH stands for "Dietary Approaches to Stop Hypertension." The DASH eating plan is a healthy eating plan that has been shown to reduce high blood pressure (hypertension). Additional health benefits may include reducing the risk of type 2 diabetes mellitus, heart disease, and stroke. The DASH eating plan may also help with weight loss. WHAT DO I NEED TO KNOW ABOUT THE DASH EATING PLAN? For the DASH eating plan, you will follow these general guidelines:  Choose foods with a percent daily value for sodium of less than 5% (as listed on the food label).  Use salt-free seasonings or herbs instead of table salt or sea salt.  Check with your health care provider or pharmacist before using salt substitutes.  Eat lower-sodium products, often labeled as "lower sodium" or "no salt added."  Eat fresh foods.  Eat more vegetables, fruits, and low-fat dairy products.  Choose whole grains. Look for the word "whole" as the first word in the ingredient list.  Choose fish and skinless chicken or [place]key more often than red meat. Limit fish, poultry, and meat to 6 oz (170 g) each  day.  Limit sweets, desserts, sugars, and sugary drinks.  Choose heart-healthy fats.  Limit cheese to 1 oz (28 g) per day.  Eat more home-cooked food and less restaurant, buffet, and fast food.  Limit fried foods.  Cook foods using methods other than frying.  Limit canned vegetables. If you do use them, rinse them well to decrease the sodium.  When eating at a restaurant, ask that your food be prepared with less salt, or no salt if possible. WHAT FOODS CAN I EAT? Seek help from a dietitian for individual calorie needs. Grains Whole grain or whole wheat bread. Brown rice. Whole grain or whole wheat pasta. Quinoa, bulgur, and whole grain cereals. Low-sodium cereals. Corn or whole wheat flour tortillas. Whole grain cornbread. Whole grain crackers. Low-sodium crackers. Vegetables Fresh or frozen vegetables (raw, steamed, roasted, or grilled). Low-sodium or reduced-sodium tomato and vegetable juices. Low-sodium or reduced-sodium tomato sauce and paste. Low-sodium or reduced-sodium canned vegetables.  Fruits All fresh, canned (in natural juice), or frozen fruits. Meat and Other Protein Products Ground beef (85% or leaner), grass-fed beef, or beef trimmed of fat. Skinless chicken or [place]key. Ground chicken or [place]key. Pork trimmed of fat. All fish and seafood. Eggs. Dried beans, peas, or lentils. Unsalted nuts and seeds. Unsalted canned beans. Dairy Low-fat dairy products, such as skim or 1% milk, 2% or reduced-fat cheeses, low-fat ricotta or cottage cheese, or plain low-fat yogurt. Low-sodium or reduced-sodium cheeses. Fats and Oils Tub margarines without trans fats. Light or  reduced-fat mayonnaise and salad dressings (reduced sodium). Avocado. Safflower, olive, or canola oils. Natural peanut or almond butter. Other Unsalted popcorn and pretzels. The items listed above may not be a complete list of recommended foods or beverages. Contact your dietitian for more options. WHAT FOODS ARE NOT  RECOMMENDED? Grains White bread. White pasta. White rice. Refined cornbread. Bagels and croissants. Crackers that contain trans fat. Vegetables Creamed or fried vegetables. Vegetables in a cheese sauce. Regular canned vegetables. Regular canned tomato sauce and paste. Regular tomato and vegetable juices. Fruits Dried fruits. Canned fruit in light or heavy syrup. Fruit juice. Meat and Other Protein Products Fatty cuts of meat. Ribs, chicken wings, bacon, sausage, bologna, salami, chitterlings, fatback, hot dogs, bratwurst, and packaged luncheon meats. Salted nuts and seeds. Canned beans with salt. Dairy Whole or 2% milk, cream, half-and-half, and cream cheese. Whole-fat or sweetened yogurt. Full-fat cheeses or blue cheese. Nondairy creamers and whipped toppings. Processed cheese, cheese spreads, or cheese curds. Condiments Onion and garlic salt, seasoned salt, table salt, and sea salt. Canned and packaged gravies. Worcestershire sauce. Tartar sauce. Barbecue sauce. Teriyaki sauce. Soy sauce, including reduced sodium. Steak sauce. Fish sauce. Oyster sauce. Cocktail sauce. Horseradish. Ketchup and mustard. Meat flavorings and tenderizers. Bouillon cubes. Hot sauce. Tabasco sauce. Marinades. Taco seasonings. Relishes. Fats and Oils Butter, stick margarine, lard, shortening, ghee, and bacon fat. Coconut, palm kernel, or palm oils. Regular salad dressings. Other Pickles and olives. Salted popcorn and pretzels. The items listed above may not be a complete list of foods and beverages to avoid. Contact your dietitian for more information. WHERE CAN I FIND MORE INFORMATION? National Heart, Lung, and Blood Institute: [employer] Document Released: 01/26/2011 Document Revised: 06/23/2013 Document Reviewed: 12/11/2012 [facility] Patient Information 2015 [place], [place]. This information is not intended to replace advice given to you by your health care provider. Make  sure you discuss any questions you have with your health care provider.

## 2015-08-12 ENCOUNTER — Other Ambulatory Visit: Payer: Self-pay | Admitting: Physician Assistant

## 2015-08-12 DIAGNOSIS — Z79899 Other long term (current) drug therapy: Secondary | ICD-10-CM

## 2015-08-12 DIAGNOSIS — E785 Hyperlipidemia, unspecified: Secondary | ICD-10-CM

## 2015-08-12 LAB — HEPATIC FUNCTION PANEL
ALBUMIN: 4.5 g/dL (ref 3.6–5.1)
ALT: 28 U/L (ref 9–46)
AST: 28 U/L (ref 10–35)
Alkaline Phosphatase: 54 U/L (ref 40–115)
BILIRUBIN INDIRECT: 0.7 mg/dL (ref 0.2–1.2)
Bilirubin, Direct: 0.1 mg/dL (ref <=0.2)
TOTAL PROTEIN: 6.9 g/dL (ref 6.1–8.1)
Total Bilirubin: 0.8 mg/dL (ref 0.2–1.2)

## 2015-08-12 LAB — BASIC METABOLIC PANEL WITH GFR
BUN: 12 mg/dL (ref 7–25)
CO2: 25 mmol/L (ref 20–31)
Calcium: 9.4 mg/dL (ref 8.6–10.3)
Chloride: 93 mmol/L — ABNORMAL LOW (ref 98–110)
Creat: 0.96 mg/dL (ref 0.70–1.33)
GFR, Est African American: >89 mL/min (ref >=60)
GFR, Est Non African American: >89 mL/min (ref >=60)
Glucose, Bld: 83 mg/dL (ref 65–99)
Potassium: 3.9 mmol/L (ref 3.5–5.3)
Sodium: 131 mmol/L — ABNORMAL LOW (ref 135–146)

## 2015-08-12 LAB — MAGNESIUM: Magnesium: 2.1 mg/dL (ref 1.5–2.5)

## 2015-08-12 LAB — LIPID PANEL
Cholesterol: 297 mg/dL — ABNORMAL HIGH (ref 125–200)
HDL: 76 mg/dL (ref >=40)
LDL Cholesterol: 164 mg/dL — ABNORMAL HIGH (ref <130)
Total CHOL/HDL Ratio: 3.9 Ratio (ref <=5.0)
Triglycerides: 283 mg/dL — ABNORMAL HIGH (ref <150)
VLDL: 57 mg/dL — ABNORMAL HIGH (ref <30)

## 2015-08-12 LAB — VITAMIN D 25 HYDROXY (VIT D DEFICIENCY, FRACTURES): Vit D, 25-Hydroxy: 38 ng/mL (ref 30–100)

## 2015-08-12 MED ORDER — ATORVASTATIN CALCIUM 40 MG PO TABS: 40.0000 mg | ORAL_TABLET | Freq: Every day | ORAL | Status: DC

## 2015-12-21 ENCOUNTER — Ambulatory Visit (INDEPENDENT_AMBULATORY_CARE_PROVIDER_SITE_OTHER): Payer: Commercial Managed Care - PPO | Admitting: Internal Medicine

## 2015-12-21 ENCOUNTER — Encounter: Payer: Self-pay | Admitting: Internal Medicine

## 2015-12-21 VITALS — BP 138/80 | HR 82 | Temp 98.2°F | Resp 16 | Ht 69.0 in | Wt 165.0 lb

## 2015-12-21 DIAGNOSIS — Z79899 Other long term (current) drug therapy: Secondary | ICD-10-CM

## 2015-12-21 DIAGNOSIS — E559 Vitamin D deficiency, unspecified: Secondary | ICD-10-CM

## 2015-12-21 DIAGNOSIS — R7303 Prediabetes: Secondary | ICD-10-CM

## 2015-12-21 DIAGNOSIS — G4700 Insomnia, unspecified: Secondary | ICD-10-CM

## 2015-12-21 DIAGNOSIS — Z23 Encounter for immunization: Secondary | ICD-10-CM

## 2015-12-21 DIAGNOSIS — E782 Mixed hyperlipidemia: Secondary | ICD-10-CM

## 2015-12-21 DIAGNOSIS — I10 Essential (primary) hypertension: Secondary | ICD-10-CM

## 2015-12-21 LAB — CBC WITH DIFFERENTIAL/PLATELET
BASOS ABS: 59 {cells}/uL (ref 0–200)
Basophils Relative: 1 %
EOS PCT: 3 %
Eosinophils Absolute: 177 cells/uL (ref 15–500)
HCT: 39.7 % (ref 38.5–50.0)
HEMOGLOBIN: 13.5 g/dL (ref 13.2–17.1)
LYMPHS ABS: 1652 {cells}/uL (ref 850–3900)
Lymphocytes Relative: 28 %
MCH: 31.7 pg (ref 27.0–33.0)
MCHC: 34 g/dL (ref 32.0–36.0)
MCV: 93.2 fL (ref 80.0–100.0)
MPV: 9.8 fL (ref 7.5–12.5)
Monocytes Absolute: 649 cells/uL (ref 200–950)
Monocytes Relative: 11 %
NEUTROS ABS: 3363 {cells}/uL (ref 1500–7800)
NEUTROS PCT: 57 %
PLATELETS: 232 10*3/uL (ref 140–400)
RBC: 4.26 MIL/uL (ref 4.20–5.80)
RDW: 13.5 % (ref 11.0–15.0)
WBC: 5.9 10*3/uL (ref 3.8–10.8)

## 2015-12-21 MED ORDER — DICLOFENAC SODIUM ER 100 MG PO TB24: 100.0000 mg | ORAL_TABLET | Freq: Every day | ORAL | 1 refills | Status: DC

## 2015-12-21 MED ORDER — LISINOPRIL-HYDROCHLOROTHIAZIDE 20-25 MG PO TABS: 1.0000 | ORAL_TABLET | Freq: Every day | ORAL | 1 refills | Status: DC

## 2015-12-21 MED ORDER — EZETIMIBE 10 MG PO TABS: 10.0000 mg | ORAL_TABLET | Freq: Every day | ORAL | 11 refills | Status: DC

## 2015-12-21 NOTE — Progress Notes (Signed)
Assessment and Plan:  Hypertension:  -cont meds -well controlled currently -Continue medication,  -monitor blood pressure at home.  -Continue DASH diet.   -Reminder to go to the ER if any CP, SOB, nausea, dizziness, severe HA, changes vision/speech, left arm numbness and tingling, and jaw pain.  Cholesterol: -stop lipitor -start zetia -Continue diet and exercise.  -Check cholesterol.   Pre-diabetes: -Continue diet and exercise.  -Check A1C  Vitamin D Def: -check level -continue medications.   Insomnia -secondary to job and home stress -not ready to do anything about this  Continue diet and meds as discussed. Further disposition pending results of labs.  HPI 55 y.o. male  presents for 3 month follow up with hypertension, hyperlipidemia, prediabetes and vitamin D.   His blood pressure has been controlled at home, today their BP is BP: 138/80.   He does not workout. He denies chest pain, shortness of breath, dizziness.   He is on cholesterol medication and denies myalgias. His cholesterol is at goal. The cholesterol last visit was:   Lab Results  Component Value Date   CHOL 297 (H) 08/11/2015   HDL 76 08/11/2015   LDLCALC 164 (H) 08/11/2015   TRIG 283 (H) 08/11/2015   CHOLHDL 3.9 08/11/2015  He is not taking lipitor secondary to myalgias and arthralgias.  He has never tried anything before this.  He would like to try something else.    He has been working on diet and exercise for prediabetes, and denies foot ulcerations, hyperglycemia, hypoglycemia , increased appetite, nausea, paresthesia of the feet, polydipsia, polyuria, visual disturbances, vomiting and weight loss. Last A1C in the office was:  Lab Results  Component Value Date   HGBA1C 5.2 09/02/2014    Patient is on Vitamin D supplement.  Lab Results  Component Value Date   VD25OH 6538 08/11/2015     He is still stressed out.  He is not sleeping well because this.  He reports that he does not want to do  anything about this at this time.    Current Medications:  Current Outpatient Prescriptions on File Prior to Visit  Medication Sig Dispense Refill  . aspirin EC 325 MG tablet Take 1 tablet (325 mg total) by mouth 2 (two) times daily. 30 tablet 0  . Cholecalciferol (VITAMIN D-3) 5000 UNITS TABS Take 15,000 Units by mouth daily. Reported on 08/11/2015    . Diclofenac Sodium CR 100 MG 24 hr tablet Take 1 tablet (100 mg total) by mouth daily. 90 tablet 1  . lisinopril-hydrochlorothiazide (PRINZIDE,ZESTORETIC) 20-25 MG tablet Take 1 tablet by mouth daily. for blood pressure 90 tablet 1  . POTASSIUM PO Take 1 tablet by mouth daily.     No current facility-administered medications on file prior to visit.     Medical History:  Past Medical History:  Diagnosis Date  . Arthritis   . Hyperlipidemia   . Hypertension   . Unspecified vitamin D deficiency    states he takes Vit D just as prevention    Allergies: No Known Allergies   Review of Systems:  Review of Systems  Constitutional: Negative for chills, fever and malaise/fatigue.  HENT: Negative for congestion, ear pain and sore throat.   Eyes: Negative.   Respiratory: Negative for cough, shortness of breath and wheezing.   Cardiovascular: Negative for chest pain, palpitations and leg swelling.  Gastrointestinal: Negative for abdominal pain, blood in stool, constipation, diarrhea, heartburn and melena.  Genitourinary: Negative.   Skin: Negative.   Neurological: Negative  for dizziness, sensory change, loss of consciousness and headaches.  Psychiatric/Behavioral: Negative for depression. The patient is not nervous/anxious and does not have insomnia.     Family history- Review and unchanged  Social history- Review and unchanged  Physical Exam: BP 138/80   Pulse 82   Temp 98.2 F (36.8 C) (Temporal)   Resp 16   Ht 5\' 9"  (1.753 m)   Wt 165 lb (74.8 kg)   BMI 24.37 kg/m  Wt Readings from Last 3 Encounters:  12/21/15 165 lb (74.8  kg)  08/11/15 168 lb 12.8 oz (76.6 kg)  09/02/14 165 lb (74.8 kg)    General Appearance: Well nourished well developed, in no apparent distress. Eyes: PERRLA, EOMs, conjunctiva no swelling or erythema ENT/Mouth: Ear canals normal without obstruction, swelling, erythma, discharge.  TMs normal bilaterally.  Oropharynx moist, clear, without exudate, or postoropharyngeal swelling. Neck: Supple, thyroid normal,no cervical adenopathy  Respiratory: Respiratory effort normal, Breath sounds clear A&P without rhonchi, wheeze, or rale.  No retractions, no accessory usage. Cardio: RRR with no MRGs. Brisk peripheral pulses without edema.  Abdomen: Soft, + BS,  Non tender, no guarding, rebound, hernias, masses. Musculoskeletal: Full ROM, 5/5 strength, Normal gait Skin: Warm, dry without rashes, lesions, ecchymosis.  Neuro: Awake and oriented X 3, Cranial nerves intact. Normal muscle tone, no cerebellar symptoms. Psych: Normal affect, Insight and Judgment appropriate.    Terri Piedraourtney Forcucci, PA-C 4:38 PM [facility]

## 2015-12-22 LAB — BASIC METABOLIC PANEL WITH GFR
BUN: 20 mg/dL (ref 7–25)
CALCIUM: 9.5 mg/dL (ref 8.6–10.3)
CO2: 25 mmol/L (ref 20–31)
CREATININE: 1.18 mg/dL (ref 0.70–1.33)
Chloride: 97 mmol/L — ABNORMAL LOW (ref 98–110)
GFR, EST NON AFRICAN AMERICAN: 71 mL/min (ref >=60)
GFR, Est African American: 82 mL/min (ref >=60)
Glucose, Bld: 87 mg/dL (ref 65–99)
Potassium: 3.9 mmol/L (ref 3.5–5.3)
Sodium: 135 mmol/L (ref 135–146)

## 2015-12-22 LAB — LIPID PANEL
CHOL/HDL RATIO: 5.4 ratio — AB (ref <=5.0)
CHOLESTEROL: 288 mg/dL — AB (ref 125–200)
HDL: 53 mg/dL (ref >=40)
TRIGLYCERIDES: 466 mg/dL — AB (ref <150)

## 2015-12-22 LAB — HEPATIC FUNCTION PANEL
ALT: 27 U/L (ref 9–46)
AST: 24 U/L (ref 10–35)
Albumin: 4.4 g/dL (ref 3.6–5.1)
Alkaline Phosphatase: 55 U/L (ref 40–115)
BILIRUBIN DIRECT: 0.1 mg/dL (ref <=0.2)
BILIRUBIN INDIRECT: 0.3 mg/dL (ref 0.2–1.2)
BILIRUBIN TOTAL: 0.4 mg/dL (ref 0.2–1.2)
Total Protein: 6.7 g/dL (ref 6.1–8.1)

## 2016-02-09 ENCOUNTER — Encounter: Payer: Self-pay | Admitting: Physician Assistant

## 2016-02-09 ENCOUNTER — Ambulatory Visit (INDEPENDENT_AMBULATORY_CARE_PROVIDER_SITE_OTHER): Payer: Commercial Managed Care - PPO | Admitting: Physician Assistant

## 2016-02-09 VITALS — BP 130/82 | HR 101 | Temp 97.9°F | Resp 16 | Ht 69.0 in | Wt 174.0 lb

## 2016-02-09 DIAGNOSIS — J0100 Acute maxillary sinusitis, unspecified: Secondary | ICD-10-CM

## 2016-02-09 MED ORDER — AZITHROMYCIN 250 MG PO TABS: ORAL_TABLET | ORAL | 1 refills | Status: AC

## 2016-02-09 MED ORDER — PREDNISONE 20 MG PO TABS: ORAL_TABLET | ORAL | 0 refills | Status: DC

## 2016-02-09 NOTE — Progress Notes (Signed)
Subjective:    Patient ID: Jermaine Stewart, male    DOB: 09-28-1960, 55 y.o.   MRN: [id number]  HPI 55 y.o. WM presents with cold symptoms x 7 days. Has been taking zyrtec, sudafed, benadryl, nasocort without help, feels it is getting worse. Has sinus congestion, pressure, sore throat, cough with yellow mucus, no fever, chills, SOB, CP.   Blood pressure 130/82, pulse (!) 101, temperature 97.9 F (36.6 C), resp. rate 16, height 5\' 9"  (1.753 m), weight 174 lb (78.9 kg), SpO2 97 %.  Medications Current Outpatient Prescriptions on File Prior to Visit  Medication Sig  . aspirin EC 325 MG tablet Take 1 tablet (325 mg total) by mouth 2 (two) times daily.  . Cholecalciferol (VITAMIN Stewart-3) 5000 UNITS TABS Take 15,000 Units by mouth daily. Reported on 08/11/2015  . Diclofenac Sodium CR 100 MG 24 hr tablet Take 1 tablet (100 mg total) by mouth daily.  Marland Kitchen. ezetimibe (ZETIA) 10 MG tablet Take 1 tablet (10 mg total) by mouth daily.  Marland Kitchen. lisinopril-hydrochlorothiazide (PRINZIDE,ZESTORETIC) 20-25 MG tablet Take 1 tablet by mouth daily. for blood pressure  . POTASSIUM PO Take 1 tablet by mouth daily.   No current facility-administered medications on file prior to visit.     Problem list He has Vitamin Stewart deficiency; Hyperlipidemia; Essential hypertension; Prediabetes; Medication management; and Osteoarthritis of right knee on his problem list.   Review of Systems  Constitutional: Negative for chills, diaphoresis and fever.  HENT: Positive for congestion, sinus pressure, sneezing and sore throat. Negative for ear pain, trouble swallowing and voice change.   Eyes: Negative.   Respiratory: Positive for cough. Negative for chest tightness, shortness of breath and wheezing.   Cardiovascular: Negative.   Gastrointestinal: Negative.   Genitourinary: Negative.   Musculoskeletal: Negative for neck pain.  Neurological: Negative.  Negative for headaches.       Objective:   Physical Exam  Constitutional:  He is oriented to person, place, and time. He appears well-developed and well-nourished.  HENT:  Head: Normocephalic and atraumatic.  Right Ear: Hearing and tympanic membrane normal.  Left Ear: Hearing and tympanic membrane normal.  Nose: Right sinus exhibits maxillary sinus tenderness. Left sinus exhibits maxillary sinus tenderness.  Mouth/Throat: Uvula is midline and mucous membranes are normal. Posterior oropharyngeal erythema present. No oropharyngeal exudate, posterior oropharyngeal edema or tonsillar abscesses.  Eyes: Conjunctivae are normal. Pupils are equal, round, and reactive to light.  Neck: Normal range of motion. Neck supple.  Cardiovascular: Normal rate and regular rhythm.   Pulmonary/Chest: Effort normal and breath sounds normal.  Abdominal: Soft. Bowel sounds are normal.  Musculoskeletal: Normal range of motion.  Lymphadenopathy:    He has no cervical adenopathy.  Neurological: He is alert and oriented to person, place, and time.  Skin: Skin is warm and dry. No rash noted.      Assessment & Plan:  1. Acute maxillary sinusitis, recurrence not specified Will hold the zpak and take if she is not getting better, increase fluids, rest, cont allergy pill - predniSONE (DELTASONE) 20 MG tablet; 2 tablets daily for 3 days, 1 tablet daily for 4 days.  Dispense: 10 tablet; Refill: 0 - azithromycin (ZITHROMAX) 250 MG tablet; Take 2 tablets (500 mg) on  Day 1,  followed by 1 tablet (250 mg) once daily on Days 2 through 5.  Dispense: 6 each; Refill: 1   Future Appointments Date Time Provider Department Center  04/24/2016 2:00 PM Lucky CowboyWilliam McKeown, MD [facility] None

## 2016-02-09 NOTE — Patient Instructions (Signed)
Make sure you are on an allergy pill, see below for more details. Please take the prednisone as directed below, this is NOT an antibiotic so you do NOT have to finish it. You can take it for a few days and stop it if you are doing better.   Please take the prednisone to help decrease inflammation and therefore decrease symptoms. Take it it with food to avoid GI upset. It can cause increased energy but on the other hand it can make it hard to sleep at night so please take it AT NIGHT WITH DINNER, it takes 8-12 hours to start working so it will NOT affect your sleeping if you take it at night with your food!!  If you are diabetic it will increase your sugars so decrease carbs and monitor your sugars closely.     Can do a steroid nasal spary 1-2 sparys at night each nostril. Remember to spray each nostril twice towards the outer part of your eye.  Do not sniff but instead pinch your nose and tilt your head back to help the medicine get into your sinuses.  The best time to do this is at bedtime. Stop if you get blurred vision or nose bleeds.    HOW TO TREAT VIRAL COUGH AND COLD SYMPTOMS:  -Symptoms usually last at least 1 week with the worst symptoms being around day 4.  - colds usually start with a sore throat and end with a cough, and the cough can take 2 weeks to get better.  -No antibiotics are needed for colds, flu, sore throats, cough, bronchitis UNLESS symptoms are longer than 7 days OR if you are getting better then get drastically worse.  -There are a lot of combination medications (Dayquil, Nyquil, Vicks 44, tyelnol cold and sinus, ETC). Please look at the ingredients on the back so that you are treating the correct symptoms and not doubling up on medications/ingredients.    Medicines you can use  Nasal congestion  - pseudoephedrine (Sudafed)- behind the counter, do not use if you have high blood pressure, medicine that have -D in them.  - phenylephrine (Sudafed PE) -Dextormethorphan +  chlorpheniramine (Coridcidin HBP)- okay if you have high blood pressure -Oxymetazoline (Afrin) nasal spray- LIMIT to 3 days -Saline nasal spray -Neti pot (used distilled or bottled water)  Ear pain/congestion  -pseudoephedrine (sudafed) - Nasonex/flonase nasal spray  Fever  -Acetaminophen (Tyelnol) -Ibuprofen (Advil, motrin, aleve)  Sore Throat  -Acetaminophen (Tyelnol) -Ibuprofen (Advil, motrin, aleve) -Drink a lot of water -Gargle with salt water - Rest your voice (don't talk) -Throat sprays -Cough drops  Body Aches  -Acetaminophen (Tyelnol) -Ibuprofen (Advil, motrin, aleve)  Headache  -Acetaminophen (Tyelnol) -Ibuprofen (Advil, motrin, aleve) - Exedrin, Exedrin Migraine  Allergy symptoms (cough, sneeze, runny nose, itchy eyes) -Claritin or loratadine cheapest but likely the weakest  -Zyrtec or certizine at night because it can make you sleepy -The strongest is allegra or fexafinadine  Cheapest at [employer], [employer], [employer]  Cough  -Dextromethorphan (Delsym)- medicine that has DM in it -Guafenesin (Mucinex/Robitussin) - cough drops - drink lots of water  Chest Congestion  -Guafenesin (Mucinex/Robitussin)  Red Itchy Eyes  - Naphcon-A  Upset Stomach  - Bland diet (nothing spicy, greasy, fried, and high acid foods like tomatoes, oranges, berries) -OKAY- cereal, bread, soup, crackers, rice -Eat smaller more frequent meals -reduce caffeine, no alcohol -Loperamide (Imodium-AD) if diarrhea -Prevacid for heart burn  General health when sick  -Hydration -wash your hands frequently -keep surfaces clean -change   pillow cases and sheets often -Get fresh air but do not exercise strenuously -Vitamin D, double up on it - Vitamin C -Zinc       

## 2016-02-26 ENCOUNTER — Encounter: Payer: Self-pay | Admitting: Internal Medicine

## 2016-02-27 ENCOUNTER — Encounter: Payer: Self-pay | Admitting: Physician Assistant

## 2016-02-27 ENCOUNTER — Other Ambulatory Visit: Payer: Self-pay | Admitting: Internal Medicine

## 2016-02-27 ENCOUNTER — Telehealth: Payer: Commercial Managed Care - PPO | Admitting: Family

## 2016-02-27 DIAGNOSIS — J209 Acute bronchitis, unspecified: Secondary | ICD-10-CM

## 2016-02-27 MED ORDER — AZITHROMYCIN 250 MG PO TABS: ORAL_TABLET | ORAL | 0 refills | Status: DC

## 2016-02-27 MED ORDER — AZELASTINE HCL 0.1 % NA SOLN: 2.0000 | Freq: Two times a day (BID) | NASAL | 2 refills | Status: DC

## 2016-02-27 MED ORDER — BENZONATATE 100 MG PO CAPS: 100.0000 mg | ORAL_CAPSULE | Freq: Two times a day (BID) | ORAL | 0 refills | Status: DC | PRN

## 2016-02-27 NOTE — Progress Notes (Signed)

## 2016-03-11 ENCOUNTER — Encounter: Payer: Self-pay | Admitting: Physician Assistant

## 2016-03-11 ENCOUNTER — Encounter: Payer: Self-pay | Admitting: Internal Medicine

## 2016-03-12 ENCOUNTER — Encounter: Payer: Self-pay | Admitting: Physician Assistant

## 2016-03-12 ENCOUNTER — Other Ambulatory Visit: Payer: Self-pay | Admitting: Physician Assistant

## 2016-03-12 DIAGNOSIS — J0100 Acute maxillary sinusitis, unspecified: Secondary | ICD-10-CM

## 2016-03-12 MED ORDER — LEVOFLOXACIN 500 MG PO TABS: 500.0000 mg | ORAL_TABLET | Freq: Every day | ORAL | 0 refills | Status: DC

## 2016-03-12 MED ORDER — PREDNISONE 20 MG PO TABS: ORAL_TABLET | ORAL | 0 refills | Status: DC

## 2016-03-13 ENCOUNTER — Ambulatory Visit (INDEPENDENT_AMBULATORY_CARE_PROVIDER_SITE_OTHER): Payer: Commercial Managed Care - PPO | Admitting: Physician Assistant

## 2016-03-13 ENCOUNTER — Ambulatory Visit (HOSPITAL_COMMUNITY)
Admission: RE | Admit: 2016-03-13 | Discharge: 2016-03-13 | Disposition: A | Discharge status: Home / Self Care | Payer: Commercial Managed Care - PPO | Source: Ambulatory Visit | Attending: Physician Assistant | Admitting: Physician Assistant

## 2016-03-13 ENCOUNTER — Encounter: Payer: Self-pay | Admitting: Physician Assistant

## 2016-03-13 VITALS — BP 126/92 | HR 97 | Temp 98.2°F | Resp 16 | Wt 173.6 lb

## 2016-03-13 DIAGNOSIS — R05 Cough: Secondary | ICD-10-CM

## 2016-03-13 DIAGNOSIS — R059 Cough, unspecified: Secondary | ICD-10-CM

## 2016-03-13 MED ORDER — PROMETHAZINE-DM 6.25-15 MG/5ML PO SYRP: 5.0000 mL | ORAL_SOLUTION | Freq: Four times a day (QID) | ORAL | 1 refills | Status: DC | PRN

## 2016-03-13 MED ORDER — PROMETHAZINE-CODEINE 6.25-10 MG/5ML PO SYRP: 5.0000 mL | ORAL_SOLUTION | Freq: Four times a day (QID) | ORAL | 0 refills | Status: DC | PRN

## 2016-03-13 MED ORDER — LEVOFLOXACIN 500 MG PO TABS: 500.0000 mg | ORAL_TABLET | Freq: Every day | ORAL | 0 refills | Status: DC

## 2016-03-13 NOTE — Progress Notes (Signed)
Subjective:    Patient ID: Jermaine Stewart, male    DOB: 27-Jul-1960, 56 y.o.   MRN: [id number]  HPI 56 y.o. WM presents with continuing URI. Patient seen 02/09/2016 for sinusitis, given prednisone and zpak. Had Evisit 01/07 and received another round of zpak. He states he continues to have cough/not feeling well, runny nose, cough with laryngitis every evening. He is on zyrtec, Nasacort, astelin and benadryl at night, some chills but no fever. He has history of smokeless tobacco use, never a smoker. Last CXR 2015.   Blood pressure (!) 126/92, pulse 97, temperature 98.2 F (36.8 C), resp. rate 16, weight 173 lb 9.6 oz (78.7 kg), SpO2 97 %.  Medications Current Outpatient Prescriptions on File Prior to Visit  Medication Sig  . aspirin EC 325 MG tablet Take 1 tablet (325 mg total) by mouth 2 (two) times daily.  Marland Kitchen azelastine (ASTELIN) 0.1 % nasal spray Place 2 sprays into both nostrils 2 (two) times daily. Use in each nostril as directed  . benzonatate (TESSALON) 100 MG capsule Take 1 capsule (100 mg total) by mouth 2 (two) times daily as needed for cough.  . Cholecalciferol (VITAMIN D-3) 5000 UNITS TABS Take 15,000 Units by mouth daily. Reported on 08/11/2015  . Diclofenac Sodium CR 100 MG 24 hr tablet Take 1 tablet (100 mg total) by mouth daily.  Marland Kitchen ezetimibe (ZETIA) 10 MG tablet Take 1 tablet (10 mg total) by mouth daily.  Marland Kitchen levofloxacin (LEVAQUIN) 500 MG tablet Take 1 tablet (500 mg total) by mouth daily.  Marland Kitchen lisinopril-hydrochlorothiazide (PRINZIDE,ZESTORETIC) 20-25 MG tablet Take 1 tablet by mouth daily. for blood pressure  . POTASSIUM PO Take 1 tablet by mouth daily.  . predniSONE (DELTASONE) 20 MG tablet 2 tablets daily for 3 days, 1 tablet daily for 4 days.   No current facility-administered medications on file prior to visit.     Problem list He has Vitamin D deficiency; Hyperlipidemia; Essential hypertension; Prediabetes; Medication management; and Osteoarthritis of right knee on  his problem list.  Review of Systems  Constitutional: Negative for chills, diaphoresis and fever.  HENT: Positive for congestion, rhinorrhea and sinus pressure. Negative for ear pain, sneezing, sore throat, trouble swallowing and voice change.   Eyes: Negative.   Respiratory: Positive for cough. Negative for chest tightness, shortness of breath and wheezing.   Cardiovascular: Negative.   Gastrointestinal: Negative.   Genitourinary: Negative.   Musculoskeletal: Negative for neck pain.  Neurological: Negative.  Negative for headaches.       Objective:   Physical Exam  Constitutional: He is oriented to person, place, and time. He appears well-developed and well-nourished.  HENT:  Head: Normocephalic and atraumatic.  Right Ear: External ear normal.  Left Ear: External ear normal.  Mouth/Throat: Oropharynx is clear and moist.  Eyes: Conjunctivae and EOM are normal. Pupils are equal, round, and reactive to light.  Neck: Normal range of motion. Neck supple.  Cardiovascular: Normal rate, regular rhythm and normal heart sounds.   Pulmonary/Chest: Effort normal and breath sounds normal.  Abdominal: Soft. Bowel sounds are normal.  Musculoskeletal: Normal range of motion.  Neurological: He is alert and oriented to person, place, and time. No cranial nerve deficit.  Skin: Skin is warm and dry.  Psychiatric: He has a normal mood and affect. His behavior is normal.       Assessment & Plan:  1. Cough - DG Chest 2 View; Future Get chest xray, lungs CTAB, no SOB/CP Levaquin and need to control  cough/voice rest/cough drops/syrup sent in  Go to the Er if any SOB, CP, Palpitations, ETC  Future Appointments Date Time Provider Department Center  04/24/2016 2:00 PM Lucky CowboyWilliam McKeown, MD [facility] None

## 2016-03-13 NOTE — Patient Instructions (Addendum)
Get chest xray  Common causes of cough OR hoarseness OR sore throat:   Allergies, Viral Infections, Acid Reflux and Bacterial Infections.  Allergies and viral infections cause a cough OR sore throat by post nasal drip and are often worse at night, can also have sneezing, lower grade fevers, clear/yellow mucus. This is best treated with allergy medications or nasal sprays.  Please get on allegra for 1-2 weeks The strongest is allegra or fexafinadine  Cheapest at [employer], [employer], [employer]  Bacterial infections are more severe than allergies or viral infections with fever, teeth pain, fatigue. This can be treated with prednisone and the same over the counter medication and after 7 days can be treated with an antibiotic.    sometimes irritation causes more irritation. Try voice rest, use sugar free cough drops to prevent coughing, and try to stop clearing your throat.   If you ever have a cough that does not go away after trying these things please make a follow up visit for further evaluation or we can refer you to a specialist. Or if you ever have shortness of breath or chest pain go to the ER.    HOW TO TREAT VIRAL COUGH AND COLD SYMPTOMS:  -Symptoms usually last at least 1 week with the worst symptoms being around day 4.  - colds usually start with a sore throat and end with a cough, and the cough can take 2 weeks to get better.  -No antibiotics are needed for colds, flu, sore throats, cough, bronchitis UNLESS symptoms are longer than 7 days OR if you are getting better then get drastically worse.  -There are a lot of combination medications (Dayquil, Nyquil, Vicks 44, tyelnol cold and sinus, ETC). Please look at the ingredients on the back so that you are treating the correct symptoms and not doubling up on medications/ingredients.    Medicines you can use  Nasal congestion  - pseudoephedrine (Sudafed)- behind the counter, do not use if you have high blood pressure, medicine that have -D in  them.  - phenylephrine (Sudafed PE) -Dextormethorphan + chlorpheniramine (Coridcidin HBP)- okay if you have high blood pressure -Oxymetazoline (Afrin) nasal spray- LIMIT to 3 days -Saline nasal spray -Neti pot (used distilled or bottled water)  Ear pain/congestion  -pseudoephedrine (sudafed) - Nasonex/flonase nasal spray  Fever  -Acetaminophen (Tyelnol) -Ibuprofen (Advil, motrin, aleve)  Sore Throat  -Acetaminophen (Tyelnol) -Ibuprofen (Advil, motrin, aleve) -Drink a lot of water -Gargle with salt water - Rest your voice (don't talk) -Throat sprays -Cough drops  Body Aches  -Acetaminophen (Tyelnol) -Ibuprofen (Advil, motrin, aleve)  Headache  -Acetaminophen (Tyelnol) -Ibuprofen (Advil, motrin, aleve) - Exedrin, Exedrin Migraine  Allergy symptoms (cough, sneeze, runny nose, itchy eyes) -Claritin or loratadine cheapest but likely the weakest  -Zyrtec or certizine at night because it can make you sleepy -The strongest is allegra or fexafinadine  Cheapest at [employer], [employer], [employer]  Cough  -Dextromethorphan (Delsym)- medicine that has DM in it -Guafenesin (Mucinex/Robitussin) - cough drops - drink lots of water  Chest Congestion  -Guafenesin (Mucinex/Robitussin)  Red Itchy Eyes  - Naphcon-A  Upset Stomach  - Bland diet (nothing spicy, greasy, fried, and high acid foods like tomatoes, oranges, berries) -OKAY- cereal, bread, soup, crackers, rice -Eat smaller more frequent meals -reduce caffeine, no alcohol -Loperamide (Imodium-AD) if diarrhea -Prevacid for heart burn  General health when sick  -Hydration -wash your hands frequently -keep surfaces clean -change pillow cases and sheets often -Get fresh air but do not exercise strenuously -  Vitamin D, double up on it - Vitamin C -Zinc

## 2016-04-23 NOTE — Progress Notes (Addendum)
[facility]   Lucky Cowboy, M.D.    Dyanne Carrel. Steffanie Dunn, P.A.-C      Terri Piedra, P.A.-C  [facility]                [address]                [place], [place]. [address] Telephone [phone] Telefax [phone] Annual  Screening/Preventative Visit  & Comprehensive Evaluation & Examination     This very nice 56 y.o. MWM presents for a Screening/Preventative Visit & comprehensive evaluation and management of multiple medical co-morbidities.  Patient has been followed for HTN, Prediabetes, Hyperlipidemia and Vitamin D Deficiency. Today patient is also c/o pain around the base of the right thumb and triggering of the thumb.      HTN predates since 2000. Patient's BP has been controlled at home.  Today's BP is at goal - 124/78. Patient denies any cardiac symptoms as chest pain, palpitations, shortness of breath, dizziness or ankle swelling.     Patient's hyperlipidemia is not controlled with diet and he stopped Atorvastatin due to myalgias & was Rx'd Zetia at last OV.  Last lipids were not at goal with elevated Trig's: Lab Results  Component Value Date   CHOL 288 (H) 12/21/2015   HDL 53 12/21/2015   LDLCALC NOT CALC 12/21/2015   TRIG 466 (H) 12/21/2015   CHOLHDL 5.4 (H) 12/21/2015      Patient is screened proactively for prediabetes and patient denies reactive hypoglycemic symptoms, visual blurring, diabetic polys or paresthesias. Last A1c was at goal: Lab Results  Component Value Date   HGBA1C 5.2 09/02/2014       Finally, patient has history of Vitamin D Deficiency ("25" in 2014) and last vitamin D was still low (goal 70-100): Lab Results   VD25OH 38 08/11/2015   Current Outpatient Prescriptions on File Prior to Visit  Medication Sig  . aspirin EC 325 MG tablet Take 1 tab 2 x daily.  . ASTELIN nasal spray 2 sprays into nostrils 2 x daily  . VITAMIN D 5000 UNITS Take 15,000 Units  daily.   . Diclofenac   CR 100 MG  Take 1 tab daily.  . Ezetimibe  10 MG  Take 1 tab daily.  Marland Kitchen lisinopril-hctz 20-25 MG  Take 1 tab daily  . POTASSIUM Take 1 tab daily.   No Known Allergies  Past Medical History:  Diagnosis Date  . Arthritis   . Hyperlipidemia   . Hypertension   . Vitamin D deficiency    Health Maintenance  Topic Date Due  . COLONOSCOPY  01/08/2013  . TETANUS/TDAP  07/23/2023  . INFLUENZA VACCINE  Completed  . Hepatitis C Screening  Completed  . HIV Screening  Completed   Immunization History  Administered Date(s) Administered  . Influenza,inj,quad, With Preservative 12/21/2015  . PPD Test 07/22/2013  . Tdap 07/22/2013   Past Surgical History:  Procedure Laterality Date  . HERNIA REPAIR Bilateral    15 years ago  . Left  TOTAL KNEE ARTHROPLASTY - Nestor Lewandowsky  12/17/2013  . Right  TOTAL KNEE ARTHROPLASTY -  Nestor Lewandowsky  03/04/2014  . VASECTOMY     Family History  Problem Relation Age of Onset  . Cancer  BREAST Mother   . Hypertension Brother   . Stroke Brother   . Hypertension Brother   . Hypertension Brother    Social History   Social History  .  Marital status: Married    Spouse name: N/A  . Number of children: 2 daughters    Occupational History  .    Social History Main Topics  . Smoking status: Never Smoker  . Smokeless tobacco: Current User    Types: Chew  . Alcohol use 1.2 oz/week    2 Cans of beer per week     Comment: 2-3 beer a day  . Drug use: No  . Sexual activity: Yes    ROS Constitutional: Denies fever, chills, weight loss/gain, headaches, insomnia,  night sweats or change in appetite. Does c/o fatigue. Eyes: Denies redness, blurred vision, diplopia, discharge, itchy or watery eyes.  ENT: Denies discharge, congestion, post nasal drip, epistaxis, sore throat, earache, hearing loss, dental pain, Tinnitus, Vertigo, Sinus pain or snoring.  Cardio: Denies chest pain, palpitations, irregular heartbeat, syncope, dyspnea, diaphoresis, orthopnea,  PND, claudication or edema Respiratory: denies cough, dyspnea, DOE, pleurisy, hoarseness, laryngitis or wheezing.  Gastrointestinal: Denies dysphagia, heartburn, reflux, water brash, pain, cramps, nausea, vomiting, bloating, diarrhea, constipation, hematemesis, melena, hematochezia, jaundice or hemorrhoids Genitourinary: Denies dysuria, frequency, urgency, nocturia, hesitancy, discharge, hematuria or flank pain Musculoskeletal: Denies arthralgia, myalgia, stiffness, Jt. Swelling, pain, limp or strain/sprain. Denies Falls. Skin: Denies puritis, rash, hives, warts, acne, eczema or change in skin lesion Neuro: No weakness, tremor, incoordination, spasms, paresthesia or pain Psychiatric: Denies confusion, memory loss or sensory loss. Denies Depression. Endocrine: Denies change in weight, skin, hair change, nocturia, and paresthesia, diabetic polys, visual blurring or hyper / hypo glycemic episodes.  Heme/Lymph: No excessive bleeding, bruising or enlarged lymph nodes.  Physical Exam  BP 124/78   P 96   T 97.5 F    R 16   Ht 5' 10.75"    Wt 172 lb 9.6 oz    BMI 24.24  General Appearance: Well nourished, in no apparent distress.  Eyes: PERRLA, EOMs, conjunctiva no swelling or erythema, normal fundi and vessels. Sinuses: No frontal/maxillary tenderness ENT/Mouth: EACs patent / TMs  nl. Nares clear without erythema, swelling, mucoid exudates. Oral hygiene is good. No erythema, swelling, or exudate. Tongue normal, non-obstructing. Tonsils not swollen or erythematous. Hearing normal.  Neck: Supple, thyroid normal. No bruits, nodes or JVD. Respiratory: Respiratory effort normal.  BS equal and clear bilateral without rales, rhonci, wheezing or stridor. Cardio: Heart sounds are normal with regular rate and rhythm and no murmurs, rubs or gallops. Peripheral pulses are normal and equal bilaterally without edema. No aortic or femoral bruits. Chest: symmetric with normal excursions and percussion.   Abdomen: Soft, with Nl bowel sounds. Nontender, no guarding, rebound, hernias, masses, or organomegaly.  Lymphatics: Non tender without lymphadenopathy.  Genitourinary: No hernias.Testes nl. DRE - prostate nl for age - smooth & firm w/o nodules. Musculoskeletal: Full ROM all peripheral extremities, joint stability, 5/5 strength, and normal gait. Skin: Warm and dry without rashes, lesions, cyanosis, clubbing or  ecchymosis.  Neuro: Cranial nerves intact, reflexes equal bilaterally. Normal muscle tone, no cerebellar symptoms. Sensation intact.  Pysch: Alert and oriented X 3 with normal affect, insight and judgment appropriate.   Assessment and Plan  1. Annual Preventative/Screening Exam    2. Essential hypertension  - Microalbumin / creatinine urine ratio - EKG 12-Lead - US, RETROPERITNL ABD,  LTD - Urinalysis, Routine w reflex microscopic - CBC with Differential/Platelet - BASIC METABOLIC PANEL WITH GFR - Magnesium - TSH  3. Mixed hyperlipidemia  - EKG 12-Lead - US, RETROPERITNL ABD,  LTD - Lipid panel - TSH  4. Prediabetes  -  EKG 12-Lead - [place], RETROPERITNL ABD,  LTD - Hemoglobin A1c - Insulin, random  5. Vitamin D deficiency  - VITAMIN D 25 Hydroxy  6. Screening for ischemic heart disease  - EKG 12-Lead  7. Screening for AAA (aortic abdominal aneurysm)  - [place], RETROPERITNL ABD,  LTD  8. Screening for colorectal cancer  - POC Hemoccult Bld/Stl   9. Screening for prostate cancer  - PSA  10. Fatigue  - Vitamin B12 - Iron and TIBC - Testosterone - CBC with Differential/Platelet  11. Medication management  - Urinalysis, Routine w reflex microscopic - CBC with Differential/Platelet - BASIC METABOLIC PANEL WITH GFR - Magnesium - Lipid panel - TSH - Hemoglobin A1c - Insulin, random - VITAMIN D 25 Hydroxy   12. Screening examination for pulmonary tuberculosis  - PPD      Continue prudent diet as discussed, weight control, BP monitoring, regular  exercise, and medications as discussed.  Discussed med effects and SE's. Routine screening labs and tests as requested with regular follow-up as recommended. Over 40 minutes of exam, counseling, chart review and high complex critical decision making was performed

## 2016-04-24 ENCOUNTER — Ambulatory Visit (INDEPENDENT_AMBULATORY_CARE_PROVIDER_SITE_OTHER): Payer: Commercial Managed Care - PPO | Admitting: Internal Medicine

## 2016-04-24 ENCOUNTER — Encounter: Payer: Self-pay | Admitting: Internal Medicine

## 2016-04-24 VITALS — BP 124/78 | HR 96 | Temp 97.5°F | Resp 16 | Ht 70.75 in | Wt 172.6 lb

## 2016-04-24 DIAGNOSIS — Z0000 Encounter for general adult medical examination without abnormal findings: Secondary | ICD-10-CM | POA: Diagnosis not present

## 2016-04-24 DIAGNOSIS — Z1212 Encounter for screening for malignant neoplasm of rectum: Secondary | ICD-10-CM

## 2016-04-24 DIAGNOSIS — E782 Mixed hyperlipidemia: Secondary | ICD-10-CM

## 2016-04-24 DIAGNOSIS — R5383 Other fatigue: Secondary | ICD-10-CM

## 2016-04-24 DIAGNOSIS — E559 Vitamin D deficiency, unspecified: Secondary | ICD-10-CM

## 2016-04-24 DIAGNOSIS — Z0001 Encounter for general adult medical examination with abnormal findings: Secondary | ICD-10-CM

## 2016-04-24 DIAGNOSIS — Z79899 Other long term (current) drug therapy: Secondary | ICD-10-CM

## 2016-04-24 DIAGNOSIS — R7303 Prediabetes: Secondary | ICD-10-CM | POA: Diagnosis not present

## 2016-04-24 DIAGNOSIS — Z136 Encounter for screening for cardiovascular disorders: Secondary | ICD-10-CM | POA: Diagnosis not present

## 2016-04-24 DIAGNOSIS — I10 Essential (primary) hypertension: Secondary | ICD-10-CM

## 2016-04-24 DIAGNOSIS — Z111 Encounter for screening for respiratory tuberculosis: Secondary | ICD-10-CM

## 2016-04-24 DIAGNOSIS — Z125 Encounter for screening for malignant neoplasm of prostate: Secondary | ICD-10-CM

## 2016-04-24 DIAGNOSIS — Z1211 Encounter for screening for malignant neoplasm of colon: Secondary | ICD-10-CM

## 2016-04-24 NOTE — Patient Instructions (Signed)

## 2016-04-25 LAB — BASIC METABOLIC PANEL WITH GFR
BUN: 16 mg/dL (ref 7–25)
CALCIUM: 9.9 mg/dL (ref 8.6–10.3)
CO2: 29 mmol/L (ref 20–31)
CREATININE: 1.08 mg/dL (ref 0.70–1.33)
Chloride: 90 mmol/L — ABNORMAL LOW (ref 98–110)
GFR, EST NON AFRICAN AMERICAN: 78 mL/min (ref >=60)
GFR, Est African American: >89 mL/min (ref >=60)
GLUCOSE: 91 mg/dL (ref 65–99)
Potassium: 4 mmol/L (ref 3.5–5.3)
SODIUM: 130 mmol/L — AB (ref 135–146)

## 2016-04-25 LAB — CBC WITH DIFFERENTIAL/PLATELET
BASOS ABS: 0 {cells}/uL (ref 0–200)
Basophils Relative: 0 %
EOS PCT: 0 %
Eosinophils Absolute: 0 cells/uL — ABNORMAL LOW (ref 15–500)
HCT: 44.6 % (ref 38.5–50.0)
Hemoglobin: 14.9 g/dL (ref 13.2–17.1)
LYMPHS PCT: 12 %
Lymphs Abs: 1164 cells/uL (ref 850–3900)
MCH: 31.2 pg (ref 27.0–33.0)
MCHC: 33.4 g/dL (ref 32.0–36.0)
MCV: 93.5 fL (ref 80.0–100.0)
MPV: 9.9 fL (ref 7.5–12.5)
Monocytes Absolute: 485 cells/uL (ref 200–950)
Monocytes Relative: 5 %
NEUTROS PCT: 83 %
Neutro Abs: 8051 cells/uL — ABNORMAL HIGH (ref 1500–7800)
Platelets: 308 10*3/uL (ref 140–400)
RBC: 4.77 MIL/uL (ref 4.20–5.80)
RDW: 13.6 % (ref 11.0–15.0)
WBC: 9.7 10*3/uL (ref 3.8–10.8)

## 2016-04-25 LAB — URINALYSIS, ROUTINE W REFLEX MICROSCOPIC
BILIRUBIN URINE: NEGATIVE
Glucose, UA: NEGATIVE
Hgb urine dipstick: NEGATIVE
Ketones, ur: NEGATIVE
Leukocytes, UA: NEGATIVE
Nitrite: NEGATIVE
PH: 6 (ref 5.0–8.0)
Protein, ur: NEGATIVE
SPECIFIC GRAVITY, URINE: 1.009 (ref 1.001–1.035)

## 2016-04-25 LAB — LIPID PANEL
CHOLESTEROL: 292 mg/dL — AB (ref <200)
HDL: 76 mg/dL (ref >40)
TRIGLYCERIDES: 444 mg/dL — AB (ref <150)
Total CHOL/HDL Ratio: 3.8 Ratio (ref <5.0)

## 2016-04-25 LAB — PSA: PSA: 0.4 ng/mL (ref <=4.0)

## 2016-04-25 LAB — MICROALBUMIN / CREATININE URINE RATIO
Creatinine, Urine: 70 mg/dL (ref 20–370)
MICROALB/CREAT RATIO: 3 ug/mg{creat} (ref <30)
Microalb, Ur: 0.2 mg/dL

## 2016-04-25 LAB — VITAMIN B12: Vitamin B-12: 465 pg/mL (ref 200–1100)

## 2016-04-25 LAB — IRON AND TIBC
%SAT: 25 % (ref 15–60)
Iron: 99 ug/dL (ref 50–180)
TIBC: 399 ug/dL (ref 250–425)
UIBC: 300 ug/dL (ref 125–400)

## 2016-04-25 LAB — VITAMIN D 25 HYDROXY (VIT D DEFICIENCY, FRACTURES): Vit D, 25-Hydroxy: 26 ng/mL — ABNORMAL LOW (ref 30–100)

## 2016-04-25 LAB — HEMOGLOBIN A1C
HEMOGLOBIN A1C: 4.9 % (ref <5.7)
MEAN PLASMA GLUCOSE: 94 mg/dL

## 2016-04-25 LAB — MAGNESIUM: Magnesium: 2.2 mg/dL (ref 1.5–2.5)

## 2016-04-25 LAB — TESTOSTERONE: TESTOSTERONE: 308 ng/dL (ref 250–827)

## 2016-04-25 LAB — INSULIN, RANDOM: INSULIN: 8.5 u[IU]/mL (ref 2.0–19.6)

## 2016-04-25 LAB — TSH: TSH: 0.51 mIU/L (ref 0.40–4.50)

## 2016-04-26 DIAGNOSIS — M1812 Unilateral primary osteoarthritis of first carpometacarpal joint, left hand: Secondary | ICD-10-CM | POA: Diagnosis not present

## 2016-04-26 DIAGNOSIS — M65312 Trigger thumb, left thumb: Secondary | ICD-10-CM | POA: Diagnosis not present

## 2016-05-16 ENCOUNTER — Other Ambulatory Visit: Payer: Self-pay | Admitting: *Deleted

## 2016-05-16 ENCOUNTER — Encounter: Payer: Self-pay | Admitting: Internal Medicine

## 2016-05-16 ENCOUNTER — Ambulatory Visit (INDEPENDENT_AMBULATORY_CARE_PROVIDER_SITE_OTHER): Payer: Commercial Managed Care - PPO | Admitting: Internal Medicine

## 2016-05-16 VITALS — BP 126/78 | HR 88 | Temp 97.5°F | Resp 16 | Ht 70.75 in | Wt 167.2 lb

## 2016-05-16 DIAGNOSIS — S39012A Strain of muscle, fascia and tendon of lower back, initial encounter: Secondary | ICD-10-CM | POA: Diagnosis not present

## 2016-05-16 DIAGNOSIS — E782 Mixed hyperlipidemia: Secondary | ICD-10-CM

## 2016-05-16 LAB — TB SKIN TEST
Induration: 0 mm
TB SKIN TEST: NEGATIVE

## 2016-05-16 MED ORDER — CYCLOBENZAPRINE HCL 10 MG PO TABS: ORAL_TABLET | ORAL | 1 refills | Status: AC

## 2016-05-16 MED ORDER — ROSUVASTATIN CALCIUM 20 MG PO TABS: 20.0000 mg | ORAL_TABLET | Freq: Every day | ORAL | 1 refills | Status: DC

## 2016-05-16 MED ORDER — EZETIMIBE 10 MG PO TABS: 10.0000 mg | ORAL_TABLET | Freq: Every day | ORAL | 1 refills | Status: DC

## 2016-05-16 MED ORDER — PREDNISONE 20 MG PO TABS: ORAL_TABLET | ORAL | 1 refills | Status: DC

## 2016-05-16 NOTE — Patient Instructions (Signed)

## 2016-05-16 NOTE — Progress Notes (Signed)
   Subjective:    Patient ID: Jermaine Stewart, male    DOB: [birth year], 56 y.o.   MRN: [id number]  HPI  Patient is a 56 yo MWM who presents wit a 1 month hx/o bilateral LBP w/o inciting injury/event and denies any sciatica type radiations. Denies  Parasthesias, foot drop or bladder issues.   Medication Sig  . aspirin EC 325 MG tablet Take 1 tablet (325 mg total) by mouth 2 (two) times daily.  . Cholecalciferol (VITAMIN D-3) 5000 UNITS TABS Take 15,000 Units by mouth daily. Reported on 08/11/2015  . Diclofenac Sodium CR 100 MG 24 hr tablet Take 1 tablet (100 mg total) by mouth daily.  Marland Kitchen. lisinopril-hydrochlorothiazide (PRINZIDE,ZESTORETIC) 20-25 MG tablet Take 1 tablet by mouth daily. for blood pressure  . POTASSIUM PO Take 1 tablet by mouth daily.  Marland Kitchen. azelastine (ASTELIN) 0.1 % nasal spray Place 2 sprays into both nostrils 2 (two) times daily. Use in each nostril as directed  . ezetimibe (ZETIA) 10 MG tablet Take 1 tablet (10 mg total) by mouth daily. (Patient not taking: Reported on 05/16/2016)   No Known Allergies   Past Medical History:  Diagnosis Date  . Arthritis   . Hyperlipidemia   . Hypertension   . Unspecified vitamin D deficiency    states he takes Vit D just as prevention   Review of Systems  10 point systems review negative except as above.    Objective:   Physical Exam  BP 126/78   Pulse 88   Temp 97.5 F (36.4 C)   Resp 16   Ht 5' 10.75" (1.797 m)   Wt 167 lb 3.2 oz (75.8 kg)   BMI 23.48 kg/m   HEENT - Eac's patent. TM's Nl. EOM's full. PERRLA. NasoOroPharynx clear. Neck - supple.  Chest - Clear equal BS. Cor - Nl HS. RRR w/o sig murmur. MS- FROM w/o deformities. Muscle power, tone and bulk Nl. Gait Nl. Neg SLR bilat. (+) tender paralumbar spasm.  Neuro - No obvious Cr N abnormalities. DTR's nl/equal. Nl w/o focal abnormalities.    Assessment & Plan:   1. Lumbosacral strain, initial encounter  - predniSONE (DELTASONE) 20 MG tablet; 1 tab 3 x day for 3  days, then 1 tab 2 x day for 3 days, then 1 tab 1 x day for 5 days  Dispense: 20 tablet; Refill: 1  - cyclobenzaprine (FLEXERIL) 10 MG tablet; Take 1/2 to 1 tablet 2 to 3 x / day if needed for muscle spasm  Dispense: 30 tablet; Refill: 1  - discussed meds/SE's and discussed stretching exercises and appropriate body ergo dynamics.

## 2016-05-25 DIAGNOSIS — M1812 Unilateral primary osteoarthritis of first carpometacarpal joint, left hand: Secondary | ICD-10-CM | POA: Diagnosis not present

## 2016-06-15 ENCOUNTER — Encounter: Payer: Self-pay | Admitting: Internal Medicine

## 2016-06-15 ENCOUNTER — Other Ambulatory Visit: Payer: Self-pay | Admitting: Internal Medicine

## 2016-06-15 DIAGNOSIS — S39012A Strain of muscle, fascia and tendon of lower back, initial encounter: Secondary | ICD-10-CM

## 2016-06-15 MED ORDER — PREDNISONE 20 MG PO TABS: ORAL_TABLET | ORAL | 1 refills | Status: DC

## 2016-07-20 ENCOUNTER — Other Ambulatory Visit: Payer: Self-pay | Admitting: Internal Medicine

## 2016-07-20 DIAGNOSIS — S39012A Strain of muscle, fascia and tendon of lower back, initial encounter: Secondary | ICD-10-CM

## 2016-07-20 MED ORDER — PREDNISONE 20 MG PO TABS: ORAL_TABLET | ORAL | 1 refills | Status: DC

## 2016-07-20 MED ORDER — CYCLOBENZAPRINE HCL 10 MG PO TABS: ORAL_TABLET | ORAL | 1 refills | Status: DC

## 2016-07-27 ENCOUNTER — Ambulatory Visit: Payer: Self-pay | Admitting: Internal Medicine

## 2016-08-16 NOTE — Progress Notes (Deleted)
Patient ID: Jermaine Stewart, male   DOB: 03/01/1960, 56 y.o.   MRN: [id number]  Assessment and Plan:  Hypertension:  -monitor blood pressure at home.  -Continue DASH diet.   -Reminder to go to the ER if any CP, SOB, nausea, dizziness, severe HA, changes vision/speech, left arm numbness and tingling, and jaw pain.  Cholesterol: -Continue diet and exercise.  -Check cholesterol.   Pre-diabetes: -Continue diet and exercise.  -Check A1C  Vitamin D Def: -check level -continue medications.    Continue diet and meds as discussed. Further disposition pending results of labs.  HPI 56 y.o. male  presents for 3 month follow up with hypertension, hyperlipidemia, prediabetes and vitamin D.   His blood pressure has been controlled at home, he is on a whole one, when he does halves it will crush it, today their BP is  .   He does workout. He denies chest pain, shortness of breath, dizziness.   He is on cholesterol medication and denies myalgias. His cholesterol is at goal. The cholesterol last visit was:   Lab Results  Component Value Date   CHOL 292 (H) 04/24/2016   HDL 76 04/24/2016   LDLCALC NOT CALC 04/24/2016   TRIG 444 (H) 04/24/2016   CHOLHDL 3.8 04/24/2016    He has been working on diet and exercise for prediabetes, and denies foot ulcerations, hyperglycemia, hypoglycemia , increased appetite, nausea, paresthesia of the feet, polydipsia, polyuria, visual disturbances, vomiting and weight loss. Last A1C in the office was:  Lab Results  Component Value Date   HGBA1C 4.9 04/24/2016   Patient is on Vitamin D supplement, he is on 15,000 daily.  Lab Results  Component Value Date   VD25OH 26 (L) 04/24/2016       Current Medications:  Current Outpatient Prescriptions on File Prior to Visit  Medication Sig Dispense Refill  . aspirin EC 325 MG tablet Take 1 tablet (325 mg total) by mouth 2 (two) times daily. 30 tablet 0  . Cholecalciferol (VITAMIN D-3) 5000 UNITS TABS Take 15,000  Units by mouth daily. Reported on 08/11/2015    . cyclobenzaprine (FLEXERIL) 10 MG tablet Take 1/2 to 1 tablet 3 x/d as needed for muscle spasm / pain 90 tablet 1  . Diclofenac Sodium CR 100 MG 24 hr tablet Take 1 tablet (100 mg total) by mouth daily. 90 tablet 1  . ezetimibe (ZETIA) 10 MG tablet Take 1 tablet (10 mg total) by mouth daily. 90 tablet 1  . lisinopril-hydrochlorothiazide (PRINZIDE,ZESTORETIC) 20-25 MG tablet Take 1 tablet by mouth daily. for blood pressure 90 tablet 1  . POTASSIUM PO Take 1 tablet by mouth daily.    . predniSONE (DELTASONE) 20 MG tablet 1 tab 3 x day for 3 days, then 1 tab 2 x day for 3 days, then 1 tab 1 x day for 5 days 20 tablet 1  . rosuvastatin (CRESTOR) 20 MG tablet Take 1 tablet (20 mg total) by mouth daily. 90 tablet 1   No current facility-administered medications on file prior to visit.     Medical History:  Past Medical History:  Diagnosis Date  . Arthritis   . Hyperlipidemia   . Hypertension   . Unspecified vitamin D deficiency    states he takes Vit D just as prevention    Allergies: No Known Allergies   Review of Systems:  Review of Systems  Constitutional: Negative for chills, fever and malaise/fatigue.  HENT: Negative for congestion, ear pain and sore throat.  Eyes: Negative.   Respiratory: Negative for cough, shortness of breath and wheezing.   Cardiovascular: Negative for chest pain, palpitations and leg swelling.  Gastrointestinal: Negative for abdominal pain, blood in stool, constipation, diarrhea, heartburn, melena, nausea and vomiting.  Musculoskeletal: Negative for joint pain.  Skin: Negative.   Neurological: Negative for dizziness, sensory change, loss of consciousness and headaches.  Psychiatric/Behavioral: Negative for depression. The patient is not nervous/anxious and does not have insomnia.     Family history- Review and unchanged  Social history- Review and unchanged  Physical Exam: There were no vitals taken for  this visit. Wt Readings from Last 3 Encounters:  05/16/16 167 lb 3.2 oz (75.8 kg)  04/24/16 172 lb 9.6 oz (78.3 kg)  03/13/16 173 lb 9.6 oz (78.7 kg)    General Appearance: Well nourished well developed, in no apparent distress. Eyes: PERRLA, EOMs, conjunctiva no swelling or erythema ENT/Mouth: Ear canals normal without obstruction, swelling, erythma, discharge.  TMs normal bilaterally.  Oropharynx moist, clear, without exudate, or postoropharyngeal swelling. Neck: Supple, thyroid normal,no cervical adenopathy  Respiratory: Respiratory effort normal, Breath sounds clear A&P without rhonchi, wheeze, or rale.  No retractions, no accessory usage. Cardio: RRR with no MRGs. Brisk peripheral pulses without edema.  Abdomen: Soft, + BS,  Non tender, no guarding, rebound, hernias, masses. Musculoskeletal: Full ROM, 5/5 strength, Normal gait Skin: Warm, dry without rashes, lesions, ecchymosis.  Neuro: Awake and oriented X 3, Cranial nerves intact. Normal muscle tone, no cerebellar symptoms. Psych: Normal affect, Insight and Judgment appropriate.    Quentin MullingAmanda Collier, PA-C 12:52 PM [facility]

## 2016-08-17 ENCOUNTER — Ambulatory Visit: Payer: Self-pay | Admitting: Physician Assistant

## 2016-08-27 ENCOUNTER — Encounter: Payer: Self-pay | Admitting: Internal Medicine

## 2016-08-27 ENCOUNTER — Other Ambulatory Visit: Payer: Self-pay | Admitting: Internal Medicine

## 2016-08-27 DIAGNOSIS — I10 Essential (primary) hypertension: Secondary | ICD-10-CM

## 2016-08-27 MED ORDER — LISINOPRIL-HYDROCHLOROTHIAZIDE 20-25 MG PO TABS: 1.0000 | ORAL_TABLET | Freq: Every day | ORAL | 1 refills | Status: DC

## 2016-09-18 ENCOUNTER — Ambulatory Visit (INDEPENDENT_AMBULATORY_CARE_PROVIDER_SITE_OTHER): Payer: Commercial Managed Care - PPO | Admitting: Physician Assistant

## 2016-09-18 ENCOUNTER — Encounter: Payer: Self-pay | Admitting: Physician Assistant

## 2016-09-18 VITALS — BP 144/88 | HR 104 | Temp 97.7°F | Resp 16 | Ht 70.75 in | Wt 167.6 lb

## 2016-09-18 DIAGNOSIS — Z79899 Other long term (current) drug therapy: Secondary | ICD-10-CM

## 2016-09-18 DIAGNOSIS — I10 Essential (primary) hypertension: Secondary | ICD-10-CM

## 2016-09-18 DIAGNOSIS — M791 Myalgia, unspecified site: Secondary | ICD-10-CM

## 2016-09-18 DIAGNOSIS — E782 Mixed hyperlipidemia: Secondary | ICD-10-CM

## 2016-09-18 DIAGNOSIS — R7303 Prediabetes: Secondary | ICD-10-CM | POA: Diagnosis not present

## 2016-09-18 LAB — CBC WITH DIFFERENTIAL/PLATELET
Basophils Absolute: 0 cells/uL (ref 0–200)
Basophils Relative: 0 %
Eosinophils Absolute: 0 cells/uL — ABNORMAL LOW (ref 15–500)
Eosinophils Relative: 0 %
HEMATOCRIT: 40.8 % (ref 38.5–50.0)
HEMOGLOBIN: 13.6 g/dL (ref 13.2–17.1)
LYMPHS PCT: 10 %
Lymphs Abs: 640 cells/uL — ABNORMAL LOW (ref 850–3900)
MCH: 31 pg (ref 27.0–33.0)
MCHC: 33.3 g/dL (ref 32.0–36.0)
MCV: 92.9 fL (ref 80.0–100.0)
MONOS PCT: 3 %
MPV: 9.5 fL (ref 7.5–12.5)
Monocytes Absolute: 192 cells/uL — ABNORMAL LOW (ref 200–950)
NEUTROS ABS: 5568 {cells}/uL (ref 1500–7800)
Neutrophils Relative %: 87 %
Platelets: 353 10*3/uL (ref 140–400)
RBC: 4.39 MIL/uL (ref 4.20–5.80)
RDW: 13.7 % (ref 11.0–15.0)
WBC: 6.4 10*3/uL (ref 3.8–10.8)

## 2016-09-18 MED ORDER — DICLOFENAC SODIUM ER 100 MG PO TB24: 100.0000 mg | ORAL_TABLET | Freq: Every day | ORAL | 1 refills | Status: DC

## 2016-09-18 NOTE — Progress Notes (Signed)
Patient ID: Jermaine Stewart, male   DOB: 06/15/1960, 56 y.o.   MRN: [id number]  Assessment and Plan:  Hypertension:  -monitor blood pressure at home.  -Continue DASH diet.   -Reminder to go to the ER if any CP, SOB, nausea, dizziness, severe HA, changes vision/speech, left arm numbness and tingling, and jaw pain.  Cholesterol: -Continue diet and exercise.  -Check cholesterol.   Pre-diabetes: -Continue diet and exercise.  -Check A1C  Vitamin D Def: -check level -continue medications.  [name] was seen today for follow-up.  Myalgia  stop statin, take NSAIDS PRN, increase fluids, will check CPK,   TSH, ESR, magnesium, potassium, denies recent tick exposure -     Sedimentation rate -     CK -     Diclofenac Sodium CR 100 MG 24 hr tablet; Take 1 tablet (100 mg total) by mouth daily.   Continue diet and meds as discussed. Further disposition pending results of labs.  HPI 56 y.o. male  presents for 3 month follow up with hypertension, hyperlipidemia, prediabetes and vitamin D.   His blood pressure has been controlled at home,  today their BP is BP: (!) 144/88.   He does workout. He denies chest pain, shortness of breath, dizziness. He feels that he is shaking with holding things, moving, cutting something up, worse in the morning, no weakness, has stiffness/joint pain in the morning. No numbness tingling in hands, no family history of tremor. No recent tick exposure.   He is on cholesterol medication, crestor 65m 2 x a week and zetia daily and denies myalgias. His cholesterol is at goal. The cholesterol last visit was:   Lab Results  Component Value Date   CHOL 292 (H) 04/24/2016   HDL 76 04/24/2016   LDLCALC NOT CALC 04/24/2016   TRIG 444 (H) 04/24/2016   CHOLHDL 3.8 04/24/2016    He has been working on diet and exercise for prediabetes, and denies foot ulcerations, hyperglycemia, hypoglycemia , increased appetite, nausea, paresthesia of the feet, polydipsia, polyuria, visual  disturbances, vomiting and weight loss. Last A1C in the office was:  Lab Results  Component Value Date   HGBA1C 4.9 04/24/2016   Patient is on Vitamin D supplement, he is on 15,000 daily.  Lab Results  Component Value Date   VD25OH 26 (L) 04/24/2016      BMI is Body mass index is 23.54 kg/m., he is working on diet and exercise. Wt Readings from Last 3 Encounters:  09/18/16 167 lb 9.6 oz (76 kg)  05/16/16 167 lb 3.2 oz (75.8 kg)  04/24/16 172 lb 9.6 oz (78.3 kg)    Current Medications:  Current Outpatient Prescriptions on File Prior to Visit  Medication Sig Dispense Refill  . aspirin EC 325 MG tablet Take 1 tablet (325 mg total) by mouth 2 (two) times daily. 30 tablet 0  . Cholecalciferol (VITAMIN D-3) 5000 UNITS TABS Take 15,000 Units by mouth daily. Reported on 08/11/2015    . cyclobenzaprine (FLEXERIL) 10 MG tablet Take 1/2 to 1 tablet 3 x/d as needed for muscle spasm / pain 90 tablet 1  . Diclofenac Sodium CR 100 MG 24 hr tablet Take 1 tablet (100 mg total) by mouth daily. 90 tablet 1  . ezetimibe (ZETIA) 10 MG tablet Take 1 tablet (10 mg total) by mouth daily. 90 tablet 1  . lisinopril-hydrochlorothiazide (PRINZIDE,ZESTORETIC) 20-25 MG tablet Take 1 tablet by mouth daily. for blood pressure 90 tablet 1  . POTASSIUM PO Take 1 tablet by  mouth daily.    . rosuvastatin (CRESTOR) 20 MG tablet Take 1 tablet (20 mg total) by mouth daily. 90 tablet 1   No current facility-administered medications on file prior to visit.     Medical History:  Past Medical History:  Diagnosis Date  . Arthritis   . Hyperlipidemia   . Hypertension   . Unspecified vitamin D deficiency    states he takes Vit D just as prevention    Allergies: No Known Allergies   Review of Systems:  Review of Systems  Constitutional: Negative for chills, fever and malaise/fatigue.  HENT: Negative for congestion, ear pain and sore throat.   Eyes: Negative.   Respiratory: Negative for cough, shortness of breath  and wheezing.   Cardiovascular: Negative for chest pain, palpitations and leg swelling.  Gastrointestinal: Negative for abdominal pain, blood in stool, constipation, diarrhea, heartburn, melena, nausea and vomiting.  Musculoskeletal: Positive for joint pain and myalgias. Negative for back pain, falls and neck pain.  Skin: Negative.   Neurological: Positive for tremors. Negative for dizziness, tingling, sensory change, speech change, focal weakness, seizures, loss of consciousness and headaches.  Psychiatric/Behavioral: Negative for depression and memory loss. The patient is not nervous/anxious and does not have insomnia.     Family history- Review and unchanged  Social history- Review and unchanged  Physical Exam: BP (!) 144/88   Pulse (!) 104   Temp 97.7 F (36.5 C)   Resp 16   Ht 5' 10.75" (1.797 m)   Wt 167 lb 9.6 oz (76 kg)   BMI 23.54 kg/m  Wt Readings from Last 3 Encounters:  09/18/16 167 lb 9.6 oz (76 kg)  05/16/16 167 lb 3.2 oz (75.8 kg)  04/24/16 172 lb 9.6 oz (78.3 kg)    General Appearance: Well nourished well developed, in no apparent distress. Eyes: PERRLA, EOMs, conjunctiva no swelling or erythema ENT/Mouth: Ear canals normal without obstruction, swelling, erythma, discharge.  TMs normal bilaterally.  Oropharynx moist, clear, without exudate, or postoropharyngeal swelling. Neck: Supple, thyroid normal,no cervical adenopathy  Respiratory: Respiratory effort normal, Breath sounds clear A&P without rhonchi, wheeze, or rale.  No retractions, no accessory usage. Cardio: RRR with no MRGs. Brisk peripheral pulses without edema.  Abdomen: Soft, + BS,  Non tender, no guarding, rebound, hernias, masses. Musculoskeletal: Full ROM, 5/5 strength, Normal gait Skin: Warm, dry without rashes, lesions, ecchymosis.  Neuro: Awake and oriented X 3, Cranial nerves intact. Normal muscle tone, no cerebellar symptoms. Psych: Normal affect, Insight and Judgment appropriate.    Vicie Mutters, PA-C 4:38 PM [facility]

## 2016-09-18 NOTE — Patient Instructions (Addendum)
Take the zetia or ezetimibe daily Take the crestor only twice a week BUT since you are having joint pain stop for now  Muscle aches Stop the statin for 1 week and then start back after we get the lab back. We are getting a lab that will look for muscle break down with the statin.  We will check your potassium, magnesium to see if these are low which can cause muscle aches.  Please make sure you are taking 64 oz of water a day as long as you do now have a heart condition.  -Please take Tylenol or Aleve for pain. -You can take tylenol (500mg ) or tylenol arthritis (650mg ). The max you can take of tylenol a day is 3000mg  daily, this is a max of 6 pills a day of the regular tyelnol (500mg ) or a max of 4 a day of the tylenol arthritis (650mg ) as long as no other medications you are taking contain tylenol.  -Aleve/Naproxen Sodium- can take 1-2 in the morning and 1 at night. Max 3 a day. Take with food to avoid ulcers. Does affect your kidney function so use sparingly.    Muscle Pain Muscle pain (myalgia) may be caused by many things, including:  Overuse or muscle strain, especially if you are not in shape. This is the most common cause of muscle pain.  Injury.  Bruises.  Viruses, such as the flu.  Infectious diseases.  Fibromyalgia, which is a chronic condition that causes muscle tenderness, fatigue, and headache.  Autoimmune diseases, including lupus.  Certain drugs, including ACE inhibitors and statins. Muscle pain may be mild or severe. In most cases, the pain lasts only a short time and goes away without treatment. To diagnose the cause of your muscle pain, your health care provider will take your medical history. This means he or she will ask you when your muscle pain began and what has been happening. If you have not had muscle pain for very long, your health care provider may want to wait before doing much testing. If your muscle pain has lasted a long time, your health care provider  may want to run tests right away. If your health care provider thinks your muscle pain may be caused by illness, you may need to have additional tests to rule out certain conditions.  Treatment for muscle pain depends on the cause. Home care is often enough to relieve muscle pain. Your health care provider may also prescribe anti-inflammatory medicine. HOME CARE INSTRUCTIONS Watch your condition for any changes. The following actions may help to lessen any discomfort you are feeling:  Only take over-the-counter or prescription medicines as directed by your health care provider.  Apply ice to the sore muscle:  Put ice in a plastic bag.  Place a towel between your skin and the bag.  Leave the ice on for 15-20 minutes, 3-4 times a day.  You may alternate applying hot and cold packs to the muscle as directed by your health care provider.  If overuse is causing your muscle pain, slow down your activities until the pain goes away.  Remember that it is normal to feel some muscle pain after starting a workout program. Muscles that have not been used often will be sore at first.  Do regular, gentle exercises if you are not usually active.  Warm up before exercising to lower your risk of muscle pain.  Do not continue working out if the pain is very bad. Bad pain could mean you have  injured a muscle. SEEK MEDICAL CARE IF:  Your muscle pain gets worse, and medicines do not help.  You have muscle pain that lasts longer than 3 days.  You have a rash or fever along with muscle pain.  You have muscle pain after a tick bite.  You have muscle pain while working out, even though you are in good physical condition.  You have redness, soreness, or swelling along with muscle pain.  You have muscle pain after starting a new medicine or changing the dose of a medicine. SEEK IMMEDIATE MEDICAL CARE IF:  You have trouble breathing.  You have trouble swallowing.  You have muscle pain along with a  stiff neck, fever, and vomiting.  You have severe muscle weakness or cannot move part of your body. MAKE SURE YOU:   Understand these instructions.  Will watch your condition.  Will get help right away if you are not doing well or get worse. Document Released: 12/29/2005 Document Revised: 02/11/2013 Document Reviewed: 12/03/2012 [facility] Patient Information 2015 [place], [place]. This information is not intended to replace advice given to you by your health care provider. Make sure you discuss any questions you have with your health care provider.  Monitor your blood pressure at home. Go to the ER if any CP, SOB, nausea, dizziness, severe HA, changes vision/speech  Goal BP:  For patients younger than 60: Goal BP < 140/90. For patients 60 and older: Goal BP < 150/90. For patients with diabetes: Goal BP < 140/90. Your most recent BP: BP: (!) 144/88   Take your medications faithfully as instructed. Maintain a healthy weight. Get at least 150 minutes of aerobic exercise per week. Minimize salt intake. Minimize alcohol intake  DASH Eating Plan DASH stands for "Dietary Approaches to Stop Hypertension." The DASH eating plan is a healthy eating plan that has been shown to reduce high blood pressure (hypertension). Additional health benefits may include reducing the risk of type 2 diabetes mellitus, heart disease, and stroke. The DASH eating plan may also help with weight loss. WHAT DO I NEED TO KNOW ABOUT THE DASH EATING PLAN? For the DASH eating plan, you will follow these general guidelines:  Choose foods with a percent daily value for sodium of less than 5% (as listed on the food label).  Use salt-free seasonings or herbs instead of table salt or sea salt.  Check with your health care provider or pharmacist before using salt substitutes.  Eat lower-sodium products, often labeled as "lower sodium" or "no salt added."  Eat fresh foods.  Eat more vegetables, fruits, and low-fat dairy  products.  Choose whole grains. Look for the word "whole" as the first word in the ingredient list.  Choose fish and skinless chicken or Mal[place]key more often than red meat. Limit fish, poultry, and meat to 6 oz (170 g) each day.  Limit sweets, desserts, sugars, and sugary drinks.  Choose heart-healthy fats.  Limit cheese to 1 oz (28 g) per day.  Eat more home-cooked food and less restaurant, buffet, and fast food.  Limit fried foods.  Cook foods using methods other than frying.  Limit canned vegetables. If you do use them, rinse them well to decrease the sodium.  When eating at a restaurant, ask that your food be prepared with less salt, or no salt if possible. WHAT FOODS CAN I EAT? Seek help from a dietitian for individual calorie needs. Grains Whole grain or whole wheat bread. Brown rice. Whole grain or whole wheat pasta. Delford FieldQuinoa,  bulgur, and whole grain cereals. Low-sodium cereals. Corn or whole wheat flour tortillas. Whole grain cornbread. Whole grain crackers. Low-sodium crackers. Vegetables Fresh or frozen vegetables (raw, steamed, roasted, or grilled). Low-sodium or reduced-sodium tomato and vegetable juices. Low-sodium or reduced-sodium tomato sauce and paste. Low-sodium or reduced-sodium canned vegetables.  Fruits All fresh, canned (in natural juice), or frozen fruits. Meat and Other Protein Products Ground beef (85% or leaner), grass-fed beef, or beef trimmed of fat. Skinless chicken or [place]key. Ground chicken or [place]urkey. Pork trimmed of fat. All fish and seafood. Eggs. Dried beans, peas, or lentils. Unsalted nuts and seeds. Unsalted canned beans. Dairy Low-fat dairy products, such as skim or 1% milk, 2% or reduced-fat cheeses, low-fat ricotta or cottage cheese, or plain low-fat yogurt. Low-sodium or reduced-sodium cheeses. Fats and Oils Tub margarines without trans fats. Light or reduced-fat mayonnaise and salad dressings (reduced sodium). Avocado. Safflower, olive, or canola  oils. Natural peanut or almond butter. Other Unsalted popcorn and pretzels. The items listed above may not be a complete list of recommended foods or beverages. Contact your dietitian for more options. WHAT FOODS ARE NOT RECOMMENDED? Grains White bread. White pasta. White rice. Refined cornbread. Bagels and croissants. Crackers that contain trans fat. Vegetables Creamed or fried vegetables. Vegetables in a cheese sauce. Regular canned vegetables. Regular canned tomato sauce and paste. Regular tomato and vegetable juices. Fruits Dried fruits. Canned fruit in light or heavy syrup. Fruit juice. Meat and Other Protein Products Fatty cuts of meat. Ribs, chicken wings, bacon, sausage, bologna, salami, chitterlings, fatback, hot dogs, bratwurst, and packaged luncheon meats. Salted nuts and seeds. Canned beans with salt. Dairy Whole or 2% milk, cream, half-and-half, and cream cheese. Whole-fat or sweetened yogurt. Full-fat cheeses or blue cheese. Nondairy creamers and whipped toppings. Processed cheese, cheese spreads, or cheese curds. Condiments Onion and garlic salt, seasoned salt, table salt, and sea salt. Canned and packaged gravies. Worcestershire sauce. Tartar sauce. Barbecue sauce. Teriyaki sauce. Soy sauce, including reduced sodium. Steak sauce. Fish sauce. Oyster sauce. Cocktail sauce. Horseradish. Ketchup and mustard. Meat flavorings and tenderizers. Bouillon cubes. Hot sauce. Tabasco sauce. Marinades. Taco seasonings. Relishes. Fats and Oils Butter, stick margarine, lard, shortening, ghee, and bacon fat. Coconut, palm kernel, or palm oils. Regular salad dressings. Other Pickles and olives. Salted popcorn and pretzels. The items listed above may not be a complete list of foods and beverages to avoid. Contact your dietitian for more information. WHERE CAN I FIND MORE INFORMATION? National Heart, Lung, and Blood Institute: CablePromo.itwww.nhlbi.nih.gov/health/health-topics/topics/dash/ Document Released:  01/26/2011 Document Revised: 06/23/2013 Document Reviewed: 12/11/2012 [facility] Patient Information 2015 [place], [place]. This information is not intended to replace advice given to you by your health care provider. Make sure you discuss any questions you have with your health care provider.

## 2016-09-19 ENCOUNTER — Encounter: Payer: Self-pay | Admitting: Physician Assistant

## 2016-09-19 DIAGNOSIS — I10 Essential (primary) hypertension: Secondary | ICD-10-CM

## 2016-09-19 DIAGNOSIS — M791 Myalgia, unspecified site: Secondary | ICD-10-CM

## 2016-09-19 LAB — HEPATIC FUNCTION PANEL
ALBUMIN: 4.9 g/dL (ref 3.6–5.1)
ALK PHOS: 74 U/L (ref 40–115)
ALT: 45 U/L (ref 9–46)
AST: 44 U/L — ABNORMAL HIGH (ref 10–35)
Bilirubin, Direct: 0.2 mg/dL (ref <=0.2)
Indirect Bilirubin: 0.5 mg/dL (ref 0.2–1.2)
Total Bilirubin: 0.7 mg/dL (ref 0.2–1.2)
Total Protein: 7.5 g/dL (ref 6.1–8.1)

## 2016-09-19 LAB — TSH: TSH: 0.54 m[IU]/L (ref 0.40–4.50)

## 2016-09-19 LAB — HEMOGLOBIN A1C
Hgb A1c MFr Bld: 4.8 % (ref <5.7)
MEAN PLASMA GLUCOSE: 91 mg/dL

## 2016-09-19 LAB — LIPID PANEL
Cholesterol: 228 mg/dL — ABNORMAL HIGH (ref <200)
HDL: 102 mg/dL (ref >40)
LDL CALC: 107 mg/dL — AB (ref <100)
Total CHOL/HDL Ratio: 2.2 Ratio (ref <5.0)
Triglycerides: 97 mg/dL (ref <150)
VLDL: 19 mg/dL (ref <30)

## 2016-09-19 LAB — BASIC METABOLIC PANEL WITH GFR
BUN: 11 mg/dL (ref 7–25)
CHLORIDE: 90 mmol/L — AB (ref 98–110)
CO2: 25 mmol/L (ref 20–31)
CREATININE: 0.78 mg/dL (ref 0.70–1.33)
Calcium: 10 mg/dL (ref 8.6–10.3)
GFR, Est African American: >89 mL/min (ref >=60)
GFR, Est Non African American: >89 mL/min (ref >=60)
Glucose, Bld: 106 mg/dL — ABNORMAL HIGH (ref 65–99)
POTASSIUM: 4.2 mmol/L (ref 3.5–5.3)
Sodium: 131 mmol/L — ABNORMAL LOW (ref 135–146)

## 2016-09-19 LAB — SEDIMENTATION RATE: SED RATE: 1 mm/h (ref 0–20)

## 2016-09-19 LAB — MAGNESIUM: Magnesium: 2.2 mg/dL (ref 1.5–2.5)

## 2016-09-19 LAB — CK: CK TOTAL: 82 U/L (ref 44–196)

## 2016-09-19 MED ORDER — LISINOPRIL-HYDROCHLOROTHIAZIDE 20-25 MG PO TABS: 1.0000 | ORAL_TABLET | Freq: Every day | ORAL | 1 refills | Status: DC

## 2016-09-19 MED ORDER — DICLOFENAC SODIUM ER 100 MG PO TB24: 100.0000 mg | ORAL_TABLET | Freq: Every day | ORAL | 1 refills | Status: DC

## 2016-09-22 ENCOUNTER — Other Ambulatory Visit: Payer: Self-pay

## 2016-09-22 DIAGNOSIS — E782 Mixed hyperlipidemia: Secondary | ICD-10-CM

## 2016-09-22 MED ORDER — EZETIMIBE 10 MG PO TABS: 10.0000 mg | ORAL_TABLET | Freq: Every day | ORAL | 1 refills | Status: DC

## 2016-10-11 DIAGNOSIS — M9903 Segmental and somatic dysfunction of lumbar region: Secondary | ICD-10-CM | POA: Diagnosis not present

## 2016-10-11 DIAGNOSIS — R201 Hypoesthesia of skin: Secondary | ICD-10-CM | POA: Diagnosis not present

## 2016-10-11 DIAGNOSIS — M5126 Other intervertebral disc displacement, lumbar region: Secondary | ICD-10-CM | POA: Diagnosis not present

## 2016-10-13 DIAGNOSIS — R201 Hypoesthesia of skin: Secondary | ICD-10-CM | POA: Diagnosis not present

## 2016-10-13 DIAGNOSIS — M9903 Segmental and somatic dysfunction of lumbar region: Secondary | ICD-10-CM | POA: Diagnosis not present

## 2016-10-13 DIAGNOSIS — M5126 Other intervertebral disc displacement, lumbar region: Secondary | ICD-10-CM | POA: Diagnosis not present

## 2016-10-16 DIAGNOSIS — M9903 Segmental and somatic dysfunction of lumbar region: Secondary | ICD-10-CM | POA: Diagnosis not present

## 2016-10-16 DIAGNOSIS — R201 Hypoesthesia of skin: Secondary | ICD-10-CM | POA: Diagnosis not present

## 2016-10-16 DIAGNOSIS — M5126 Other intervertebral disc displacement, lumbar region: Secondary | ICD-10-CM | POA: Diagnosis not present

## 2016-10-30 ENCOUNTER — Ambulatory Visit: Payer: Self-pay | Admitting: Internal Medicine

## 2016-12-23 ENCOUNTER — Encounter: Payer: Self-pay | Admitting: Internal Medicine

## 2016-12-23 ENCOUNTER — Other Ambulatory Visit: Payer: Self-pay | Admitting: Internal Medicine

## 2016-12-23 MED ORDER — PREDNISONE 20 MG PO TABS: ORAL_TABLET | ORAL | 0 refills | Status: DC

## 2016-12-26 ENCOUNTER — Encounter: Payer: Self-pay | Admitting: Internal Medicine

## 2016-12-26 ENCOUNTER — Ambulatory Visit (INDEPENDENT_AMBULATORY_CARE_PROVIDER_SITE_OTHER): Payer: Commercial Managed Care - PPO | Admitting: Internal Medicine

## 2016-12-26 VITALS — BP 126/84 | HR 84 | Temp 97.2°F | Resp 18 | Ht 70.75 in | Wt 169.0 lb

## 2016-12-26 DIAGNOSIS — I10 Essential (primary) hypertension: Secondary | ICD-10-CM

## 2016-12-26 DIAGNOSIS — Z79899 Other long term (current) drug therapy: Secondary | ICD-10-CM

## 2016-12-26 DIAGNOSIS — R7303 Prediabetes: Secondary | ICD-10-CM | POA: Diagnosis not present

## 2016-12-26 DIAGNOSIS — E559 Vitamin D deficiency, unspecified: Secondary | ICD-10-CM | POA: Diagnosis not present

## 2016-12-26 DIAGNOSIS — E782 Mixed hyperlipidemia: Secondary | ICD-10-CM | POA: Diagnosis not present

## 2016-12-26 NOTE — Patient Instructions (Signed)

## 2016-12-26 NOTE — Progress Notes (Signed)
This very nice 56 y.o. MWM presents for 3 month follow up with Hypertension, Hyperlipidemia, Pre-Diabetes and Vitamin D Deficiency.      Patient is treated for HTN (2000) & BP has been controlled at home. Today's BP is at goal - 126/84. Patient has had no complaints of any cardiac type chest pain, palpitations, dyspnea / orthopnea / PND, dizziness, claudication, or dependent edema.     Hyperlipidemia is controlled with diet & Zetia (Intolerant to Statins). Patient denies myalgias or other med SE's. Last Lipids were not at goal:  Lab Results  Component Value Date   CHOL 228 (H) 09/18/2016   HDL 102 09/18/2016   LDLCALC 107 (H) 09/18/2016   TRIG 97 09/18/2016   CHOLHDL 2.2 09/18/2016      Also, the patient is followed expectantly for PreDiabetes and has had no symptoms of reactive hypoglycemia, diabetic polys, paresthesias or visual blurring.  Last A1c was at goal: Lab Results  Component Value Date   HGBA1C 4.8 09/18/2016      Further, the patient also has history of Vitamin D Deficiency ("25" /2014) and supplements vitamin D without any suspected side-effects. Last vitamin D was very low:  Lab Results  Component Value Date   VD25OH 26 (L) 04/24/2016   Current Outpatient Medications on File Prior to Visit  Medication Sig  . aspirin EC 325 MG tablet Take 1 tablet (325 mg total) by mouth 2 (two) times daily. (Patient taking differently: Take 325 mg daily by mouth. )  . Cholecalciferol (VITAMIN D-3) 5000 UNITS TABS Take 15,000 Units by mouth daily. Reported on 08/11/2015  . Diclofenac Sodium CR 100 MG 24 hr tablet Take 1 tablet (100 mg total) by mouth daily.  Marland Kitchen. ezetimibe (ZETIA) 10 MG tablet Take 1 tablet (10 mg total) by mouth daily.  Marland Kitchen. lisinopril-hydrochlorothiazide (PRINZIDE,ZESTORETIC) 20-25 MG tablet Take 1 tablet by mouth daily. for blood pressure  . POTASSIUM PO Take 1 tablet by mouth daily.  . predniSONE (DELTASONE) 20 MG tablet 1 tab 3 x day for 2 days, then 1 tab 2 x day for  2 days, then 1 tab 1 x day for 3 days   No current facility-administered medications on file prior to visit.    No Known Allergies PMHx:   Past Medical History:  Diagnosis Date  . Arthritis   . Hyperlipidemia   . Hypertension   . Unspecified vitamin D deficiency    states he takes Vit D just as prevention   Immunization History  Administered Date(s) Administered  . Influenza,inj,quad, With Preservative 12/21/2015  . Influenza-Unspecified 11/01/2016  . PPD Test 07/22/2013, 04/24/2016  . Tdap 07/22/2013   Past Surgical History:  Procedure Laterality Date  . HERNIA REPAIR Bilateral    15 years ago  . VASECTOMY     FHx:    Reviewed / unchanged  SHx:    Reviewed / unchanged  Systems Review:  Constitutional: Denies fever, chills, wt changes, headaches, insomnia, fatigue, night sweats, change in appetite. Eyes: Denies redness, blurred vision, diplopia, discharge, itchy, watery eyes.  ENT: Denies discharge, congestion, post nasal drip, epistaxis, sore throat, earache, hearing loss, dental pain, tinnitus, vertigo, sinus pain, snoring.  CV: Denies chest pain, palpitations, irregular heartbeat, syncope, dyspnea, diaphoresis, orthopnea, PND, claudication or edema. Respiratory: denies cough, dyspnea, DOE, pleurisy, hoarseness, laryngitis, wheezing.  Gastrointestinal: Denies dysphagia, odynophagia, heartburn, reflux, water brash, abdominal pain or cramps, nausea, vomiting, bloating, diarrhea, constipation, hematemesis, melena, hematochezia  or hemorrhoids. Genitourinary: Denies dysuria,  frequency, urgency, nocturia, hesitancy, discharge, hematuria or flank pain. Musculoskeletal: Denies arthralgias, myalgias, stiffness, jt. swelling, pain, limping or strain/sprain.  Skin: Denies pruritus, rash, hives, warts, acne, eczema or change in skin lesion(s). Neuro: No weakness, tremor, incoordination, spasms, paresthesia or pain. Psychiatric: Denies confusion, memory loss or sensory loss. Endo:  Denies change in weight, skin or hair change.  Heme/Lymph: No excessive bleeding, bruising or enlarged lymph nodes.  Physical Exam  BP 126/84   Pulse 84   Temp (!) 97.2 F (36.2 C)   Resp 18   Ht 5' 10.75" (1.797 m)   Wt 169 lb (76.7 kg)   BMI 23.74 kg/m   Appears well nourished, well groomed  and in no distress.  Eyes: PERRLA, EOMs, conjunctiva no swelling or erythema. Sinuses: No frontal/maxillary tenderness ENT/Mouth: EAC's clear, TM's nl w/o erythema, bulging. Nares clear w/o erythema, swelling, exudates. Oropharynx clear without erythema or exudates. Oral hygiene is good. Tongue normal, non obstructing. Hearing intact.  Neck: Supple. Thyroid nl. Car 2+/2+ without bruits, nodes or JVD. Chest: Respirations nl with BS clear & equal w/o rales, rhonchi, wheezing or stridor.  Cor: Heart sounds normal w/ regular rate and rhythm without sig. murmurs, gallops, clicks or rubs. Peripheral pulses normal and equal  without edema.  Abdomen: Soft & bowel sounds normal. Non-tender w/o guarding, rebound, hernias, masses or organomegaly.  Lymphatics: Unremarkable.  Musculoskeletal: Full ROM all peripheral extremities, joint stability, 5/5 strength and normal gait.  Skin: Warm, dry without exposed rashes, lesions or ecchymosis apparent.  Neuro: Cranial nerves intact, reflexes equal bilaterally. Sensory-motor testing grossly intact. Tendon reflexes grossly intact.  Pysch: Alert & oriented x 3.  Insight and judgement nl & appropriate. No ideations.  Assessment and Plan:  1. Essential hypertension  - Continue medication, monitor blood pressure at home.  - Continue DASH diet. Reminder to go to the ER if any CP,  SOB, nausea, dizziness, severe HA, changes vision/speech.  - CBC with Differential/Platelet - BASIC METABOLIC PANEL WITH GFR - Magnesium - TSH  2. Hyperlipidemia, mixed  - Continue diet/meds, exercise,& lifestyle modifications.  - Continue monitor periodic cholesterol/liver &  renal functions   - Hepatic function panel - Lipid panel - TSH  3. Prediabetes  - Continue diet, exercise, lifestyle modifications.  - Monitor appropriate labs.  - Hemoglobin A1c - Insulin, random  4. Vitamin D deficiency  - Continue supplementation.  - VITAMIN D 25 Hydroxy   5. Medication management  - CBC with Differential/Platelet - BASIC METABOLIC PANEL WITH GFR - Hepatic function panel - Magnesium - Lipid panel - TSH - Hemoglobin A1c - Insulin, random - VITAMIN D 25 Hydroxy        Discussed  regular exercise, BP monitoring, weight control to achieve/maintain BMI less than 25 and discussed med and SE's. Recommended labs to assess and monitor clinical status with further disposition pending results of labs. Over 30 minutes of exam, counseling, chart review was performed.

## 2016-12-27 LAB — CBC WITH DIFFERENTIAL/PLATELET
BASOS ABS: 12 {cells}/uL (ref 0–200)
BASOS PCT: 0.2 %
EOS ABS: 0 {cells}/uL — AB (ref 15–500)
Eosinophils Relative: 0 %
HCT: 38.1 % — ABNORMAL LOW (ref 38.5–50.0)
Hemoglobin: 13.3 g/dL (ref 13.2–17.1)
Lymphs Abs: 831 cells/uL — ABNORMAL LOW (ref 850–3900)
MCH: 31.1 pg (ref 27.0–33.0)
MCHC: 34.9 g/dL (ref 32.0–36.0)
MCV: 89 fL (ref 80.0–100.0)
MONOS PCT: 7.2 %
MPV: 10.3 fL (ref 7.5–12.5)
NEUTROS PCT: 79.2 %
Neutro Abs: 4910 cells/uL (ref 1500–7800)
PLATELETS: 287 10*3/uL (ref 140–400)
RBC: 4.28 10*6/uL (ref 4.20–5.80)
RDW: 13.4 % (ref 11.0–15.0)
TOTAL LYMPHOCYTE: 13.4 %
WBC mixed population: 446 cells/uL (ref 200–950)
WBC: 6.2 10*3/uL (ref 3.8–10.8)

## 2016-12-27 LAB — BASIC METABOLIC PANEL WITH GFR
BUN: 20 mg/dL (ref 7–25)
CO2: 28 mmol/L (ref 20–32)
Calcium: 10 mg/dL (ref 8.6–10.3)
Chloride: 94 mmol/L — ABNORMAL LOW (ref 98–110)
Creat: 1.03 mg/dL (ref 0.70–1.33)
GFR, EST AFRICAN AMERICAN: 96 mL/min/{1.73_m2} (ref >=60)
GFR, EST NON AFRICAN AMERICAN: 83 mL/min/{1.73_m2} (ref >=60)
Glucose, Bld: 91 mg/dL (ref 65–99)
POTASSIUM: 4.7 mmol/L (ref 3.5–5.3)
SODIUM: 132 mmol/L — AB (ref 135–146)

## 2016-12-27 LAB — LIPID PANEL
Cholesterol: 276 mg/dL — ABNORMAL HIGH (ref <200)
HDL: 91 mg/dL (ref >40)
LDL Cholesterol (Calc): 138 mg/dL (calc) — ABNORMAL HIGH
NON-HDL CHOLESTEROL (CALC): 185 mg/dL — AB (ref <130)
TRIGLYCERIDES: 329 mg/dL — AB (ref <150)
Total CHOL/HDL Ratio: 3 (calc) (ref <5.0)

## 2016-12-27 LAB — VITAMIN D 25 HYDROXY (VIT D DEFICIENCY, FRACTURES): Vit D, 25-Hydroxy: 45 ng/mL (ref 30–100)

## 2016-12-27 LAB — TSH: TSH: 0.32 m[IU]/L — AB (ref 0.40–4.50)

## 2016-12-27 LAB — HEPATIC FUNCTION PANEL
AG Ratio: 1.8 (calc) (ref 1.0–2.5)
ALBUMIN MSPROF: 4.5 g/dL (ref 3.6–5.1)
ALKALINE PHOSPHATASE (APISO): 60 U/L (ref 40–115)
ALT: 37 U/L (ref 9–46)
AST: 38 U/L — AB (ref 10–35)
BILIRUBIN DIRECT: 0.1 mg/dL (ref 0.0–0.2)
BILIRUBIN TOTAL: 0.5 mg/dL (ref 0.2–1.2)
Globulin: 2.5 g/dL (calc) (ref 1.9–3.7)
Indirect Bilirubin: 0.4 mg/dL (calc) (ref 0.2–1.2)
Total Protein: 7 g/dL (ref 6.1–8.1)

## 2016-12-27 LAB — INSULIN, RANDOM: INSULIN: 17.1 u[IU]/mL (ref 2.0–19.6)

## 2016-12-27 LAB — HEMOGLOBIN A1C
HEMOGLOBIN A1C: 4.9 %{Hb} (ref <5.7)
Mean Plasma Glucose: 94 (calc)
eAG (mmol/L): 5.2 (calc)

## 2016-12-27 LAB — MAGNESIUM: Magnesium: 2.2 mg/dL (ref 1.5–2.5)

## 2017-01-04 ENCOUNTER — Other Ambulatory Visit: Payer: Self-pay | Admitting: Internal Medicine

## 2017-01-04 DIAGNOSIS — S39012A Strain of muscle, fascia and tendon of lower back, initial encounter: Secondary | ICD-10-CM

## 2017-01-28 ENCOUNTER — Encounter (INDEPENDENT_AMBULATORY_CARE_PROVIDER_SITE_OTHER): Payer: Self-pay

## 2017-03-02 ENCOUNTER — Encounter: Payer: Self-pay | Admitting: Adult Health

## 2017-03-03 ENCOUNTER — Other Ambulatory Visit: Payer: Self-pay | Admitting: Internal Medicine

## 2017-03-03 MED ORDER — PREDNISONE 10 MG PO TABS: ORAL_TABLET | ORAL | 1 refills | Status: DC

## 2017-03-05 ENCOUNTER — Other Ambulatory Visit: Payer: Self-pay | Admitting: Physician Assistant

## 2017-03-05 DIAGNOSIS — E782 Mixed hyperlipidemia: Secondary | ICD-10-CM

## 2017-03-18 ENCOUNTER — Other Ambulatory Visit: Payer: Self-pay | Admitting: Physician Assistant

## 2017-03-18 DIAGNOSIS — I10 Essential (primary) hypertension: Secondary | ICD-10-CM

## 2017-03-18 DIAGNOSIS — M791 Myalgia, unspecified site: Secondary | ICD-10-CM

## 2017-03-19 ENCOUNTER — Ambulatory Visit (INDEPENDENT_AMBULATORY_CARE_PROVIDER_SITE_OTHER): Payer: Commercial Managed Care - PPO | Admitting: Physician Assistant

## 2017-03-19 VITALS — BP 132/80 | HR 101 | Temp 97.7°F | Resp 18 | Wt 163.6 lb

## 2017-03-19 DIAGNOSIS — J0100 Acute maxillary sinusitis, unspecified: Secondary | ICD-10-CM

## 2017-03-19 MED ORDER — PREDNISONE 20 MG PO TABS: ORAL_TABLET | ORAL | 0 refills | Status: DC

## 2017-03-19 MED ORDER — PROMETHAZINE-DM 6.25-15 MG/5ML PO SYRP: 5.0000 mL | ORAL_SOLUTION | Freq: Four times a day (QID) | ORAL | 1 refills | Status: DC | PRN

## 2017-03-19 NOTE — Progress Notes (Signed)
Subjective:    Patient ID: Jermaine Stewart, male    DOB: 14-Apr-1960, 57 y.o.   MRN: [id number]  HPI 57 y.o. WM presents with cold symptoms x 1 month. Has been on allegra, has been on prednisone 01/13 and was feeling better, and states last 5-6 days has been worse. He has sinus congestion, yellow mucus with cough occ, no sinus pain , no teeth pain, no fever, chills.   Blood pressure 132/80, pulse (!) 101, temperature 97.7 F (36.5 C), resp. rate 18, weight 163 lb 9.6 oz (74.2 kg), SpO2 97 %.  Medications Current Outpatient Medications on File Prior to Visit  Medication Sig  . aspirin EC 325 MG tablet Take 1 tablet (325 mg total) by mouth 2 (two) times daily. (Patient taking differently: Take 325 mg daily by mouth. )  . Cholecalciferol (VITAMIN D-3) 5000 UNITS TABS Take 15,000 Units by mouth daily. Reported on 08/11/2015  . cyclobenzaprine (FLEXERIL) 10 MG tablet TAKE 1/2 TO 1 TABLET BY MOUTH THREE TIMES DAILY AS NEEDED FOR MUSCLE SPASM OR PAIN  . Diclofenac Sodium CR 100 MG 24 hr tablet TAKE 1 TABLET DAILY  . ezetimibe (ZETIA) 10 MG tablet TAKE 1 TABLET DAILY  . lisinopril-hydrochlorothiazide (PRINZIDE,ZESTORETIC) 20-25 MG tablet TAKE 1 TABLET DAILY FOR BLOOD PRESSURE  . POTASSIUM PO Take 1 tablet by mouth daily.   No current facility-administered medications on file prior to visit.     Problem list He has Vitamin D deficiency; Hyperlipidemia; Essential hypertension; Prediabetes; Medication management; and Osteoarthritis of right knee on their problem list.   Review of Systems  Constitutional: Negative for chills, diaphoresis and fever.  HENT: Positive for congestion, sinus pressure, sneezing and sore throat. Negative for ear pain, trouble swallowing and voice change.   Eyes: Negative.   Respiratory: Positive for cough. Negative for chest tightness, shortness of breath and wheezing.   Cardiovascular: Negative.   Gastrointestinal: Negative.   Genitourinary: Negative.    Musculoskeletal: Negative for neck pain.  Neurological: Negative.  Negative for headaches.       Objective:   Physical Exam  Constitutional: He is oriented to person, place, and time. He appears well-developed and well-nourished.  HENT:  Head: Normocephalic and atraumatic.  Right Ear: Hearing and tympanic membrane normal.  Left Ear: Hearing and tympanic membrane normal.  Nose: Right sinus exhibits maxillary sinus tenderness. Left sinus exhibits maxillary sinus tenderness.  Mouth/Throat: Uvula is midline and mucous membranes are normal. Posterior oropharyngeal erythema present. No oropharyngeal exudate, posterior oropharyngeal edema or tonsillar abscesses.  Eyes: Conjunctivae are normal. Pupils are equal, round, and reactive to light.  Neck: Normal range of motion. Neck supple.  Cardiovascular: Normal rate and regular rhythm.  Pulmonary/Chest: Effort normal and breath sounds normal.  Abdominal: Soft. Bowel sounds are normal.  Musculoskeletal: Normal range of motion.  Lymphadenopathy:    He has no cervical adenopathy.  Neurological: He is alert and oriented to person, place, and time.  Skin: Skin is warm and dry. No rash noted.       Assessment & Plan:    Acute maxillary sinusitis, recurrence not specified -     predniSONE (DELTASONE) 20 MG tablet; 2 tablets daily for 3 days, 1 tablet daily for 4 days. -     promethazine-dextromethorphan (PROMETHAZINE-DM) 6.25-15 MG/5ML syrup; Take 5 mLs by mouth 4 (four) times daily as needed for cough.  If not better message and I will send in ABX but at this time likely viral - Discussed the importance of  avoiding unnecessary antibiotic therapy. Suggested symptomatic OTC remedies. Nasal saline spray for congestion. Nasal steroids, allergy pill, oral steroids Follow up as needed.   Future Appointments  Date Time Provider Department Center  03/29/2017  4:30 PM Judd Gaudierorbett, Ashley, NP [facility] None  06/26/2017  3:45 PM Lucky CowboyMcKeown, William, MD  [facility] None

## 2017-03-19 NOTE — Patient Instructions (Signed)
Make sure you are on an allergy pill, see below for more details. Please take the prednisone as directed below, this is NOT an antibiotic so you do NOT have to finish it. You can take it for a few days and stop it if you are doing better.   Please take the prednisone to help decrease inflammation and therefore decrease symptoms. Take it it with food to avoid GI upset. It can cause increased energy but on the other hand it can make it hard to sleep at night so please take it AT NIGHT WITH DINNER, it takes 8-12 hours to start working so it will NOT affect your sleeping if you take it at night with your food!!  If you are diabetic it will increase your sugars so decrease carbs and monitor your sugars closely.     - Try the Flonase or Nasonex. Remember to spray each nostril twice towards the outer part of your eye.  Do not sniff but instead pinch your nose and tilt your head back to help the medicine get into your sinuses.  The best time to do this is at bedtime.Stop if you get blurred vision or nose bleeds.   if worsening HA, changes vision/speech, imbalance, weakness go to the ER   HOW TO TREAT VIRAL COUGH AND COLD SYMPTOMS:  -Symptoms usually last at least 1 week with the worst symptoms being around day 4.  - colds usually start with a sore throat and end with a cough, and the cough can take 2 weeks to get better.  -No antibiotics are needed for colds, flu, sore throats, cough, bronchitis UNLESS symptoms are longer than 7 days OR if you are getting better then get drastically worse.  -There are a lot of combination medications (Dayquil, Nyquil, Vicks 44, tyelnol cold and sinus, ETC). Please look at the ingredients on the back so that you are treating the correct symptoms and not doubling up on medications/ingredients.    Medicines you can use  Nasal congestion  Little Remedies saline spray (aerosol/mist)- can try this, it is in the kids section - pseudoephedrine (Sudafed)- behind the counter, do  not use if you have high blood pressure, medicine that have -D in them.  - phenylephrine (Sudafed PE) -Dextormethorphan + chlorpheniramine (Coridcidin HBP)- okay if you have high blood pressure -Oxymetazoline (Afrin) nasal spray- LIMIT to 3 days -Saline nasal spray -Neti pot (used distilled or bottled water)  Ear pain/congestion  -pseudoephedrine (sudafed) - Nasonex/flonase nasal spray  Fever  -Acetaminophen (Tyelnol) -Ibuprofen (Advil, motrin, aleve)  Sore Throat  -Acetaminophen (Tyelnol) -Ibuprofen (Advil, motrin, aleve) -Drink a lot of water -Gargle with salt water - Rest your voice (don't talk) -Throat sprays -Cough drops  Body Aches  -Acetaminophen (Tyelnol) -Ibuprofen (Advil, motrin, aleve)  Headache  -Acetaminophen (Tyelnol) -Ibuprofen (Advil, motrin, aleve) - Exedrin, Exedrin Migraine  Allergy symptoms (cough, sneeze, runny nose, itchy eyes) -Claritin or loratadine cheapest but likely the weakest  -Zyrtec or certizine at night because it can make you sleepy -The strongest is allegra or fexafinadine  Cheapest at [employer], [employer], [employer]  Cough  -Dextromethorphan (Delsym)- medicine that has DM in it -Guafenesin (Mucinex/Robitussin) - cough drops - drink lots of water  Chest Congestion  -Guafenesin (Mucinex/Robitussin)  Red Itchy Eyes  - Naphcon-A  Upset Stomach  - Bland diet (nothing spicy, greasy, fried, and high acid foods like tomatoes, oranges, berries) -OKAY- cereal, bread, soup, crackers, rice -Eat smaller more frequent meals -reduce caffeine, no alcohol -Loperamide (Imodium-AD) if diarrhea -  Prevacid for heart burn  General health when sick  -Hydration -wash your hands frequently -keep surfaces clean -change pillow cases and sheets often -Get fresh air but do not exercise strenuously -Vitamin D, double up on it - Vitamin C -Zinc

## 2017-03-22 ENCOUNTER — Encounter: Payer: Self-pay | Admitting: Physician Assistant

## 2017-03-22 ENCOUNTER — Other Ambulatory Visit: Payer: Self-pay | Admitting: Internal Medicine

## 2017-03-22 MED ORDER — LEVOFLOXACIN 500 MG PO TABS: ORAL_TABLET | ORAL | 1 refills | Status: DC

## 2017-03-28 NOTE — Progress Notes (Signed)
FOLLOW UP  Assessment and Plan:   Hypertension Well controlled with current medications  Monitor blood pressure at home; patient to call if consistently greater than 130/80 Continue DASH diet.   Reminder to go to the ER if any CP, SOB, nausea, dizziness, severe HA, changes vision/speech, left arm numbness and tingling and jaw pain.  Cholesterol Currently above goal; on zetia 10 mg daily; previously on atorvastatin at various doses but reports myalgias/arthralgias Continue low cholesterol diet and exercise.  Check lipid panel.   Other abnormal glucose Recent A1Cs exceedingly well controlled Continue diet and exercise.  Perform daily foot/skin check, notify office of any concerning changes.  Defer A1C; check annually; check BMP today  Vitamin D Def Well below goal at last visit; reports he was out of supplement at that time and has restarted; continue supplementation to maintain goal of 70-100 Check Vit D level  Need for colon cancer screening 4 years overdue - patient declines a colonoscopy even though the risks and benefits were discussed at length. Colon cancer is 3rd most diagnosed cancer and 2nd leading cause of death in both men and women 69 years of age and older. Patient understands the risk of cancer and death with declining the test however they are willing to do cologuard screening instead. They understand that this is not as sensitive or specific as a colonoscopy and they are still recommended to get a colonoscopy. The cologuard will be sent out to their house.   Continue diet and meds as discussed. Further disposition pending results of labs. Discussed med's effects and SE's.   Over 30 minutes of exam, counseling, chart review, and critical decision making was performed.   Future Appointments  Date Time Provider Department Center  06/26/2017  3:45 PM Lucky Cowboy, MD [facility] None     ----------------------------------------------------------------------------------------------------------------------  HPI 57 y.o. male  presents for 3 month follow up on hypertension, cholesterol, glucose management, weight and vitamin D deficiency.   BMI is Body mass index is 23.18 kg/m., he has been working on diet and exercise. Goes to gym 3 times a week, 40 minutes and weights.  Wt Readings from Last 3 Encounters:  03/29/17 165 lb (74.8 kg)  03/19/17 163 lb 9.6 oz (74.2 kg)  12/26/16 169 lb (76.7 kg)   His blood pressure has been controlled at home (130/132-78/82), today their BP is BP: 106/64  He does workout. He denies chest pain, shortness of breath, dizziness.   He is on cholesterol medication (zetia 10 mg daily) (has been on statin in past with arthralgias) and denies myalgias. His cholesterol is not at goal. The cholesterol last visit was:   Lab Results  Component Value Date   CHOL 276 (H) 12/26/2016   HDL 91 12/26/2016   LDLCALC 107 (H) 09/18/2016   TRIG 329 (H) 12/26/2016   CHOLHDL 3.0 12/26/2016    He has been working on diet and exercise for glucose management, and denies increased appetite, nausea, paresthesia of the feet, polydipsia, polyuria and visual disturbances. Last A1C in the office was:  Lab Results  Component Value Date   HGBA1C 4.9 12/26/2016   Patient is on high-dose Vitamin D supplement (15000 daily, had been off/out of supplement x 1 week at last check) but remained well below goal of 70 at the last check:    Lab Results  Component Value Date   VD25OH 45 12/26/2016        Current Medications:  Current Outpatient Medications on File Prior to Visit  Medication Sig  . aspirin EC 325 MG tablet Take 1 tablet (325 mg total) by mouth 2 (two) times daily. (Patient taking differently: Take 325 mg daily by mouth. )  . Cholecalciferol (VITAMIN D-3) 5000 UNITS TABS Take 15,000 Units by mouth daily. Reported on 08/11/2015  . cyclobenzaprine (FLEXERIL)  10 MG tablet TAKE 1/2 TO 1 TABLET BY MOUTH THREE TIMES DAILY AS NEEDED FOR MUSCLE SPASM OR PAIN  . Diclofenac Sodium CR 100 MG 24 hr tablet TAKE 1 TABLET DAILY  . ezetimibe (ZETIA) 10 MG tablet TAKE 1 TABLET DAILY  . levofloxacin (LEVAQUIN) 500 MG tablet Take 1 tablet daily with food for infection  . lisinopril-hydrochlorothiazide (PRINZIDE,ZESTORETIC) 20-25 MG tablet TAKE 1 TABLET DAILY FOR BLOOD PRESSURE  . POTASSIUM PO Take 1 tablet by mouth daily.  . promethazine-dextromethorphan (PROMETHAZINE-DM) 6.25-15 MG/5ML syrup Take 5 mLs by mouth 4 (four) times daily as needed for cough.  . predniSONE (DELTASONE) 20 MG tablet 2 tablets daily for 3 days, 1 tablet daily for 4 days. (Patient not taking: Reported on 03/29/2017)   No current facility-administered medications on file prior to visit.      Allergies: No Known Allergies   Medical History:  Past Medical History:  Diagnosis Date  . Arthritis   . Hyperlipidemia   . Hypertension   . Unspecified vitamin D deficiency    states he takes Vit D just as prevention   Family history- Reviewed and unchanged Social history- Reviewed and unchanged   Review of Systems:  Review of Systems  Constitutional: Negative for malaise/fatigue and weight loss.  HENT: Negative for hearing loss and tinnitus.   Eyes: Negative for blurred vision and double vision.  Respiratory: Negative for cough, shortness of breath and wheezing.   Cardiovascular: Negative for chest pain, palpitations, orthopnea, claudication and leg swelling.  Gastrointestinal: Negative for abdominal pain, blood in stool, constipation, diarrhea, heartburn, melena, nausea and vomiting.  Genitourinary: Negative.   Musculoskeletal: Negative for joint pain and myalgias.  Skin: Negative for rash.  Neurological: Negative for dizziness, tingling, sensory change, weakness and headaches.  Endo/Heme/Allergies: Negative for polydipsia.  Psychiatric/Behavioral: Negative.   All other systems  reviewed and are negative.    Physical Exam: BP 106/64   Ht 5' 10.75" (1.797 m)   Wt 165 lb (74.8 kg)   BMI 23.18 kg/m  Wt Readings from Last 3 Encounters:  03/29/17 165 lb (74.8 kg)  03/19/17 163 lb 9.6 oz (74.2 kg)  12/26/16 169 lb (76.7 kg)   General Appearance: Well nourished, in no apparent distress. Eyes: PERRLA, EOMs, conjunctiva no swelling or erythema Sinuses: No Frontal/maxillary tenderness ENT/Mouth: Ext aud canals clear, TMs without erythema, bulging. No erythema, swelling, or exudate on post pharynx.  Tonsils not swollen or erythematous. Hearing normal.  Neck: Supple, thyroid normal.  Respiratory: Respiratory effort normal, BS equal bilaterally without rales, rhonchi, wheezing or stridor.  Cardio: RRR with no MRGs. Brisk peripheral pulses without edema.  Abdomen: Soft, + BS.  Non tender, no guarding, rebound, hernias, masses. Lymphatics: Non tender without lymphadenopathy.  Musculoskeletal: Full ROM, 5/5 strength, Normal gait Skin: Warm, dry without rashes, lesions, ecchymosis.  Neuro: Cranial nerves intact. No cerebellar symptoms.  Psych: Awake and oriented X 3, normal affect, Insight and Judgment appropriate.    Dan MakerAshley C Liala Codispoti, NP 4:51 PM [facility]

## 2017-03-29 ENCOUNTER — Ambulatory Visit (INDEPENDENT_AMBULATORY_CARE_PROVIDER_SITE_OTHER): Payer: Commercial Managed Care - PPO | Admitting: Adult Health

## 2017-03-29 ENCOUNTER — Encounter: Payer: Self-pay | Admitting: Adult Health

## 2017-03-29 VITALS — BP 118/74 | HR 96 | Temp 97.9°F | Ht 70.75 in | Wt 165.0 lb

## 2017-03-29 DIAGNOSIS — Z1211 Encounter for screening for malignant neoplasm of colon: Secondary | ICD-10-CM

## 2017-03-29 DIAGNOSIS — Z79899 Other long term (current) drug therapy: Secondary | ICD-10-CM

## 2017-03-29 DIAGNOSIS — I10 Essential (primary) hypertension: Secondary | ICD-10-CM

## 2017-03-29 DIAGNOSIS — E782 Mixed hyperlipidemia: Secondary | ICD-10-CM

## 2017-03-29 DIAGNOSIS — E559 Vitamin D deficiency, unspecified: Secondary | ICD-10-CM | POA: Diagnosis not present

## 2017-03-29 DIAGNOSIS — R7309 Other abnormal glucose: Secondary | ICD-10-CM | POA: Diagnosis not present

## 2017-03-29 DIAGNOSIS — Z1212 Encounter for screening for malignant neoplasm of rectum: Secondary | ICD-10-CM

## 2017-03-29 NOTE — Patient Instructions (Addendum)
Cologuard is an easy to use noninvasive colon cancer screening test based on the latest advances in stool DNA science.   Colon cancer is 3rd most diagnosed cancer and 2nd leading cause of death in both men and women 55 years of age and older despite being one of the most preventable and treatable cancers if found early.  4 of out 5 people diagnosed with colon cancer have NO prior family history.  When caught EARLY 90% of colon cancer is curable.   You have agreed to do a Cologuard screening and have declined a colonoscopy in spite of being explained the risks and benefits of the colonoscopy in detail, including cancer and death. Please understand that this is test not as sensitive or specific as a colonoscopy and you are still recommended to get a colonoscopy.   If you are NOT medicare please call your insurance company and give them these items to see if they will cover it: 1) CPT code, [phone] 2) Provider is Probation officer 3) [employer] NPI [id number] 4) [place] Tax ID [phone]  Out-of-pocket cost for Cologuard can range from $0 - $649 so please call  You will receive a short call from [place] support center at [employer], when you receive a call they will say they are from [place],  to confirm your mailing address and give you more information.  When they calll you, it will appear on the caller ID as "[employer]" or in some cases only this number will appear, [phone].   [employer] will ship your collection kit directly to you. You will collect a single stool sample in the privacy of your own home, no special preparation required. You will return the kit via [employer] pre-paid shipping or pick-up, in the same box it arrived in. Then I will contact you to discuss your results after I receive them from the laboratory.   If you have any questions or concerns, Cologuard Customer Support Specialist are available 24 hours a  day, 7 days a week at [phone] or go to [email or web address].       Fat and Cholesterol Restricted Diet Getting too much fat and cholesterol in your diet may cause health problems. Following this diet helps keep your fat and cholesterol at normal levels. This can keep you from getting sick. What types of fat should I choose?  Choose monosaturated and polyunsaturated fats. These are found in foods such as olive oil, canola oil, flaxseeds, walnuts, almonds, and seeds.  Eat more omega-3 fats. Good choices include salmon, mackerel, sardines, tuna, flaxseed oil, and ground flaxseeds.  Limit saturated fats. These are in animal products such as meats, butter, and cream. They can also be in plant products such as palm oil, palm kernel oil, and coconut oil.  Avoid foods with partially hydrogenated oils in them. These contain trans fats. Examples of foods that have trans fats are stick margarine, some tub margarines, cookies, crackers, and other baked goods. What general guidelines do I need to follow?  Check food labels. Look for the words "trans fat" and "saturated fat."  When preparing a meal: ? Fill half of your plate with vegetables and green salads. ? Fill one fourth of your plate with whole grains. Look for the word "whole" as the first word in the ingredient list. ? Fill one fourth of your plate with lean protein foods.  Eat more foods that have fiber, like apples, carrots, beans, peas, and barley.  Eat more  home-cooked foods. Eat less at restaurants and buffets.  Limit or avoid alcohol.  Limit foods high in starch and sugar.  Limit fried foods.  Cook foods without frying them. Baking, boiling, grilling, and broiling are all great options.  Lose weight if you are overweight. Losing even a small amount of weight can help your overall health. It can also help prevent diseases such as diabetes and heart disease. What foods can I eat? Grains Whole grains, such as whole wheat  or whole grain breads, crackers, cereals, and pasta. Unsweetened oatmeal, bulgur, barley, quinoa, or brown rice. Corn or whole wheat flour tortillas. Vegetables Fresh or frozen vegetables (raw, steamed, roasted, or grilled). Green salads. Fruits All fresh, canned (in natural juice), or frozen fruits. Meat and Other Protein Products Ground beef (85% or leaner), grass-fed beef, or beef trimmed of fat. Skinless chicken or [place]. Ground chicken or [place]. Pork trimmed of fat. All fish and seafood. Eggs. Dried beans, peas, or lentils. Unsalted nuts or seeds. Unsalted canned or dry beans. Dairy Low-fat dairy products, such as skim or 1% milk, 2% or reduced-fat cheeses, low-fat ricotta or cottage cheese, or plain low-fat yogurt. Fats and Oils Tub margarines without trans fats. Light or reduced-fat mayonnaise and salad dressings. Avocado. Olive, canola, sesame, or safflower oils. Natural peanut or almond butter (choose ones without added sugar and oil). The items listed above may not be a complete list of recommended foods or beverages. Contact your dietitian for more options. What foods are not recommended? Grains White bread. White pasta. White rice. Cornbread. Bagels, pastries, and croissants. Crackers that contain trans fat. Vegetables White potatoes. Corn. Creamed or fried vegetables. Vegetables in a cheese sauce. Fruits Dried fruits. Canned fruit in light or heavy syrup. Fruit juice. Meat and Other Protein Products Fatty cuts of meat. Ribs, chicken wings, bacon, sausage, bologna, salami, chitterlings, fatback, hot dogs, bratwurst, and packaged luncheon meats. Liver and organ meats. Dairy Whole or 2% milk, cream, half-and-half, and cream cheese. Whole milk cheeses. Whole-fat or sweetened yogurt. Full-fat cheeses. Nondairy creamers and whipped toppings. Processed cheese, cheese spreads, or cheese curds. Sweets and Desserts Corn syrup, sugars, honey, and molasses. Candy. Jam and jelly. Syrup.  Sweetened cereals. Cookies, pies, cakes, donuts, muffins, and ice cream. Fats and Oils Butter, stick margarine, lard, shortening, ghee, or bacon fat. Coconut, palm kernel, or palm oils. Beverages Alcohol. Sweetened drinks (such as sodas, lemonade, and fruit drinks or punches). The items listed above may not be a complete list of foods and beverages to avoid. Contact your dietitian for more information. This information is not intended to replace advice given to you by your health care provider. Make sure you discuss any questions you have with your health care provider. Document Released: 08/08/2011 Document Revised: 10/14/2015 Document Reviewed: 05/08/2013 Elsevier Interactive Patient Education  [employer].

## 2017-03-30 ENCOUNTER — Other Ambulatory Visit: Payer: Self-pay | Admitting: Adult Health

## 2017-03-30 DIAGNOSIS — E782 Mixed hyperlipidemia: Secondary | ICD-10-CM

## 2017-03-30 LAB — CBC WITH DIFFERENTIAL/PLATELET
BASOS PCT: 0.5 %
Basophils Absolute: 38 cells/uL (ref 0–200)
EOS ABS: 91 {cells}/uL (ref 15–500)
EOS PCT: 1.2 %
HCT: 37.3 % — ABNORMAL LOW (ref 38.5–50.0)
Hemoglobin: 13 g/dL — ABNORMAL LOW (ref 13.2–17.1)
Lymphs Abs: 1702 cells/uL (ref 850–3900)
MCH: 31.3 pg (ref 27.0–33.0)
MCHC: 34.9 g/dL (ref 32.0–36.0)
MCV: 89.9 fL (ref 80.0–100.0)
MONOS PCT: 9.6 %
MPV: 9.8 fL (ref 7.5–12.5)
NEUTROS ABS: 5039 {cells}/uL (ref 1500–7800)
Neutrophils Relative %: 66.3 %
PLATELETS: 294 10*3/uL (ref 140–400)
RBC: 4.15 10*6/uL — AB (ref 4.20–5.80)
RDW: 13.1 % (ref 11.0–15.0)
Total Lymphocyte: 22.4 %
WBC mixed population: 730 cells/uL (ref 200–950)
WBC: 7.6 10*3/uL (ref 3.8–10.8)

## 2017-03-30 LAB — BASIC METABOLIC PANEL WITH GFR
BUN: 16 mg/dL (ref 7–25)
CALCIUM: 9.6 mg/dL (ref 8.6–10.3)
CHLORIDE: 95 mmol/L — AB (ref 98–110)
CO2: 28 mmol/L (ref 20–32)
Creat: 1.07 mg/dL (ref 0.70–1.33)
GFR, EST AFRICAN AMERICAN: 91 mL/min/{1.73_m2} (ref >=60)
GFR, Est Non African American: 78 mL/min/{1.73_m2} (ref >=60)
GLUCOSE: 91 mg/dL (ref 65–99)
POTASSIUM: 4.1 mmol/L (ref 3.5–5.3)
Sodium: 133 mmol/L — ABNORMAL LOW (ref 135–146)

## 2017-03-30 LAB — HEPATIC FUNCTION PANEL
AG Ratio: 1.6 (calc) (ref 1.0–2.5)
ALBUMIN MSPROF: 4.2 g/dL (ref 3.6–5.1)
ALT: 26 U/L (ref 9–46)
AST: 24 U/L (ref 10–35)
Alkaline phosphatase (APISO): 73 U/L (ref 40–115)
BILIRUBIN DIRECT: 0.1 mg/dL (ref 0.0–0.2)
BILIRUBIN INDIRECT: 0.4 mg/dL (ref 0.2–1.2)
GLOBULIN: 2.6 g/dL (ref 1.9–3.7)
TOTAL PROTEIN: 6.8 g/dL (ref 6.1–8.1)
Total Bilirubin: 0.5 mg/dL (ref 0.2–1.2)

## 2017-03-30 LAB — VITAMIN D 25 HYDROXY (VIT D DEFICIENCY, FRACTURES): Vit D, 25-Hydroxy: 28 ng/mL — ABNORMAL LOW (ref 30–100)

## 2017-03-30 LAB — LIPID PANEL
CHOLESTEROL: 230 mg/dL — AB (ref <200)
HDL: 51 mg/dL (ref >40)
Non-HDL Cholesterol (Calc): 179 mg/dL (calc) — ABNORMAL HIGH (ref <130)
Total CHOL/HDL Ratio: 4.5 (calc) (ref <5.0)
Triglycerides: 526 mg/dL — ABNORMAL HIGH (ref <150)

## 2017-03-30 LAB — TSH: TSH: 0.95 mIU/L (ref 0.40–4.50)

## 2017-03-30 MED ORDER — FENOFIBRATE 145 MG PO TABS
145.0000 mg | ORAL_TABLET | Freq: Every day | ORAL | 1 refills | Status: DC
Start: 2017-03-30 — End: 2017-09-13

## 2017-05-03 ENCOUNTER — Other Ambulatory Visit: Payer: Self-pay | Admitting: Internal Medicine

## 2017-05-03 ENCOUNTER — Encounter (INDEPENDENT_AMBULATORY_CARE_PROVIDER_SITE_OTHER): Payer: Self-pay

## 2017-05-03 MED ORDER — PREDNISONE 20 MG PO TABS: ORAL_TABLET | ORAL | 0 refills | Status: DC

## 2017-05-03 MED ORDER — GABAPENTIN 300 MG PO CAPS: ORAL_CAPSULE | ORAL | 0 refills | Status: DC

## 2017-05-22 DIAGNOSIS — M545 Low back pain: Secondary | ICD-10-CM | POA: Diagnosis not present

## 2017-05-22 DIAGNOSIS — M5416 Radiculopathy, lumbar region: Secondary | ICD-10-CM | POA: Diagnosis not present

## 2017-05-24 ENCOUNTER — Encounter: Payer: Self-pay | Admitting: Internal Medicine

## 2017-05-30 ENCOUNTER — Other Ambulatory Visit: Payer: Self-pay | Admitting: Internal Medicine

## 2017-06-04 DIAGNOSIS — M545 Low back pain: Secondary | ICD-10-CM | POA: Diagnosis not present

## 2017-06-18 DIAGNOSIS — M5416 Radiculopathy, lumbar region: Secondary | ICD-10-CM | POA: Diagnosis not present

## 2017-06-19 ENCOUNTER — Other Ambulatory Visit: Payer: Self-pay | Admitting: Internal Medicine

## 2017-06-19 ENCOUNTER — Encounter: Payer: Self-pay | Admitting: Internal Medicine

## 2017-06-19 DIAGNOSIS — L0291 Cutaneous abscess, unspecified: Secondary | ICD-10-CM

## 2017-06-19 MED ORDER — CEPHALEXIN 500 MG PO CAPS: ORAL_CAPSULE | ORAL | 0 refills | Status: DC

## 2017-06-22 DIAGNOSIS — M5416 Radiculopathy, lumbar region: Secondary | ICD-10-CM | POA: Diagnosis not present

## 2017-06-26 ENCOUNTER — Ambulatory Visit (INDEPENDENT_AMBULATORY_CARE_PROVIDER_SITE_OTHER): Payer: Commercial Managed Care - PPO | Admitting: Internal Medicine

## 2017-06-26 VITALS — BP 132/80 | HR 80 | Temp 97.8°F | Resp 16 | Ht 69.0 in | Wt 170.8 lb

## 2017-06-26 DIAGNOSIS — Z111 Encounter for screening for respiratory tuberculosis: Secondary | ICD-10-CM

## 2017-06-26 DIAGNOSIS — R7303 Prediabetes: Secondary | ICD-10-CM

## 2017-06-26 DIAGNOSIS — R5383 Other fatigue: Secondary | ICD-10-CM

## 2017-06-26 DIAGNOSIS — Z136 Encounter for screening for cardiovascular disorders: Secondary | ICD-10-CM

## 2017-06-26 DIAGNOSIS — I10 Essential (primary) hypertension: Secondary | ICD-10-CM | POA: Diagnosis not present

## 2017-06-26 DIAGNOSIS — Z79899 Other long term (current) drug therapy: Secondary | ICD-10-CM

## 2017-06-26 DIAGNOSIS — M5432 Sciatica, left side: Secondary | ICD-10-CM

## 2017-06-26 DIAGNOSIS — E782 Mixed hyperlipidemia: Secondary | ICD-10-CM | POA: Diagnosis not present

## 2017-06-26 DIAGNOSIS — Z1212 Encounter for screening for malignant neoplasm of rectum: Secondary | ICD-10-CM

## 2017-06-26 DIAGNOSIS — Z0001 Encounter for general adult medical examination with abnormal findings: Secondary | ICD-10-CM

## 2017-06-26 DIAGNOSIS — Z125 Encounter for screening for malignant neoplasm of prostate: Secondary | ICD-10-CM

## 2017-06-26 DIAGNOSIS — Z8249 Family history of ischemic heart disease and other diseases of the circulatory system: Secondary | ICD-10-CM

## 2017-06-26 DIAGNOSIS — E559 Vitamin D deficiency, unspecified: Secondary | ICD-10-CM

## 2017-06-26 DIAGNOSIS — Z0000 Encounter for general adult medical examination without abnormal findings: Secondary | ICD-10-CM | POA: Diagnosis not present

## 2017-06-26 DIAGNOSIS — Z1211 Encounter for screening for malignant neoplasm of colon: Secondary | ICD-10-CM

## 2017-06-26 MED ORDER — PREDNISONE 20 MG PO TABS
ORAL_TABLET | ORAL | 1 refills | Status: DC
Start: 2017-06-26 — End: 2017-08-30

## 2017-06-26 NOTE — Progress Notes (Signed)
[facility]   Lucky Cowboy, M.D.     Dyanne Carrel. Steffanie Dunn, P.A.-C Judd Gaudier, DNP [facility]                [address]                [place], [place]. [address] Telephone [phone] Telefax [phone] Annual  Screening/Preventative Visit  & Comprehensive Evaluation & Examination     This very nice 57 y.o. MWM presents for a Screening/Preventative Visit & comprehensive evaluation and management of multiple medical co-morbidities.  Patient has been followed for HTN, HLD, Prediabetes and Vitamin D Deficiency.     Patient was seen recently by [facility] for LBP w/Lt Sciatica. He has had EDSI. And is scheduled for another in 2 weeks.      HTN predates since 2000. Patient's BP has been controlled at home.  Today's BP is at goal - 132/80. Patient denies any cardiac symptoms as chest pain, palpitations, shortness of breath, dizziness or ankle swelling.     Patient is intolerant to Statins and his hyperlipidemia is not controlled with diet and Zetia. Last lipids were not at goal :  Lab Results  Component Value Date   CHOL 230 (H) 03/29/2017   HDL 51 03/29/2017   LDLCALC not calculated.  03/29/2017   TRIG 526 (H) 03/29/2017   CHOLHDL 4.5 03/29/2017      Patient is monitored expectantly for prediabetes and patient denies reactive hypoglycemic symptoms, visual blurring, diabetic polys or paresthesias. Last A1c was still Normal & at goal: Lab Results  Component Value Date   HGBA1C 5.0 06/26/2017                                                                                                                                                                                                  Finally, patient has history of Vitamin D Deficiency ("25"/2014) and last vitamin D was still very low.  Lab Results  Component Value Date   VD25OH 28 (L) 03/29/2017   Current Outpatient Medications on File Prior to Visit   Medication Sig  . aspirin EC 325 MG tablet Take 1 tablet daily  . VITAMIN D  5000  Take 15,000 Units  daily.  . cyclobenzaprine  10 MG tablet TAKE 1/2-1 TAB 3 x/ DAILY AS   . Diclofenac CR 100 MG  TAKE 1 TABLET DAILY  . ezetimibe 10 MG tablet TAKE 1 TABLET DAILY  . fenofibrate  145 MG tablet Take 1 tablet (145 mg total) by mouth daily.  Marland Kitchen  lisinopril-hctz 20/25 MG tablet TAKE 1 TABLET DAILY FOR BLOOD PRESSURE  . POTASSIUM  Take 1 tablet by mouth daily.  . Gabapentin  300 MG capsule TAKE 1 CAPSULE BY MOUTH 2 TO 3 TIMES PER DAY FOR NEUROPATHY OR PAIN (Patient not taking: Reported on 06/26/2017)   No Known Allergies   Past Medical History:  Diagnosis Date  . Arthritis   . Hyperlipidemia   . Hypertension   . Unspecified vitamin D deficiency    states he takes Vit D just as prevention   Health Maintenance  Topic Date Due  . COLONOSCOPY  01/08/2013  . INFLUENZA VACCINE  09/20/2017  . TETANUS/TDAP  07/23/2023  . Hepatitis C Screening  Completed  . HIV Screening  Completed   Immunization History  Administered Date(s) Administered  . Influenza,inj,quad, With Preservative 12/21/2015  . Influenza-Unspecified 11/01/2016  . PPD Test 07/22/2013, 04/24/2016, 06/26/2017  . Tdap 07/22/2013   Last Colon -  Past Surgical History:  Procedure Laterality Date  . HERNIA REPAIR Bilateral    15 years ago  . 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 TOTAL KNEE ARTHROPLASTY Right 03/04/2014   Procedure: TOTAL KNEE ARTHROPLASTY;  Surgeon: Nestor Lewandowsky, MD;  Location: [facility] OR;  Service: Orthopedics;  Laterality: Right;  Marland Kitchen VASECTOMY     Family History  Problem Relation Age of Onset  . Cancer Mother        BREAST  . Hypertension Brother   . Stroke Brother   . Hypertension Brother   . Hypertension Brother      ROS Constitutional: Denies fever, chills, weight  loss/gain, headaches, insomnia,  night sweats or change in appetite. Does c/o fatigue. Eyes: Denies redness, blurred vision, diplopia, discharge, itchy or watery eyes.  ENT: Denies discharge, congestion, post nasal drip, epistaxis, sore throat, earache, hearing loss, dental pain, Tinnitus, Vertigo, Sinus pain or snoring.  Cardio: Denies chest pain, palpitations, irregular heartbeat, syncope, dyspnea, diaphoresis, orthopnea, PND, claudication or edema Respiratory: denies cough, dyspnea, DOE, pleurisy, hoarseness, laryngitis or wheezing.  Gastrointestinal: Denies dysphagia, heartburn, reflux, water brash, pain, cramps, nausea, vomiting, bloating, diarrhea, constipation, hematemesis, melena, hematochezia, jaundice or hemorrhoids Genitourinary: Denies dysuria, frequency, urgency, nocturia, hesitancy, discharge, hematuria or flank pain Musculoskeletal: Denies arthralgia, myalgia, stiffness, Jt. Swelling, pain, limp or strain/sprain. Denies Falls. Skin: Denies puritis, rash, hives, warts, acne, eczema or change in skin lesion Neuro: No weakness, tremor, incoordination, spasms, paresthesia or pain Psychiatric: Denies confusion, memory loss or sensory loss. Denies Depression. Endocrine: Denies change in weight, skin, hair change, nocturia, and paresthesia, diabetic polys, visual blurring or hyper / hypo glycemic episodes.  Heme/Lymph: No excessive bleeding, bruising or enlarged lymph nodes.  Physical Exam  BP 132/80   Pulse 80   Temp 97.8 F (36.6 C)   Resp 16   Ht  (1.753 m)   Wt 170 lb 12.8 oz (77.5 kg)   BMI 25.22 kg/m   General Appearance: Well nourished and well groomed and in no apparent distress.  Eyes: PERRLA, EOMs, conjunctiva no swelling or erythema, normal fundi and vessels. Sinuses: No frontal/maxillary tenderness ENT/Mouth: EACs patent / TMs  nl. Nares clear without erythema, swelling, mucoid exudates. Oral hygiene is good. No erythema, swelling, or exudate. Tongue normal,  non-obstructing. Tonsils not swollen or erythematous. Hearing normal.  Neck: Supple, thyroid not palpable. No bruits, nodes or JVD. Respiratory: Respiratory effort normal.  BS equal and clear bilateral without rales, rhonci, wheezing or stridor. Cardio: Heart sounds are normal with regular rate and rhythm and no murmurs, rubs or gallops. Peripheral pulses are normal and equal bilaterally without edema. No aortic or femoral bruits. Chest: symmetric with normal excursions and percussion.  Abdomen: Soft, with Nl bowel sounds. Nontender, no guarding, rebound, hernias, masses, or organomegaly.  Lymphatics: Non tender without lymphadenopathy.  Genitourinary: No hernias.Testes nl. DRE - prostate nl for age - smooth & firm w/o nodules. Musculoskeletal: Full ROM all peripheral extremities, joint stability, 5/5 strength, and normal gait. Skin: Warm and dry without rashes, lesions, cyanosis, clubbing or  ecchymosis.  Neuro: Cranial nerves intact, reflexes equal bilaterally. Normal muscle tone, no cerebellar symptoms. Sensation intact.  Pysch: Alert and oriented X 3 with normal affect, insight and judgment appropriate.   Assessment and Plan  1. Annual Preventative/Screening Exam   2. Essential hypertension  - EKG 12-Lead - [place], RETROPERITNL ABD,  LTD - Urinalysis, Routine w reflex microscopic - Microalbumin / creatinine urine ratio - CBC with Differential/Platelet - COMPLETE METABOLIC PANEL WITH GFR - Magnesium - TSH  3. Hyperlipidemia, mixed  - EKG 12-Lead - [place], RETROPERITNL ABD,  LTD - Lipid panel - TSH  4. Prediabetes  - EKG 12-Lead - [place], RETROPERITNL ABD,  LTD - Hemoglobin A1c - Insulin, random  5. Vitamin D deficiency  - VITAMIN D 25 Hydroxyl   6. Sciatica of left side  - predniSONE ) 20 MG tablet; 1 tab 3 x day for 3 days, then 1 tab 2 x day for 3 days, then 1 tab 1 x day for 5 days  Dispense: 20 tablet; Refill: 1  7. Screening for colorectal cancer  - POC Hemoccult  Bld/Stl   8. Prostate cancer screening  - PSA  9. Screening for ischemic heart disease  - EKG 12-Lead - Lipid panel  10. FH: hypertension  - EKG 12-Lead - [place], RETROPERITNL ABD,  LTD  11. Screening for AAA (aortic abdominal aneurysm)  - [place], RETROPERITNL ABD,  LTD  12. Screening examination for pulmonary tuberculosis  - Testosterone - TSH - PPD  13. Fatigue, unspecified type  - Iron,Total/Total Iron Binding Cap - Vitamin B12 - CBC with Differential/Platelet - COMPLETE METABOLIC PANEL WITH GFR  14. Medication management  - Urinalysis, Routine w reflex microscopic - Microalbumin / creatinine urine ratio - CBC with Differential/Platelet - COMPLETE METABOLIC PANEL WITH GFR - Magnesium - Lipid panel - TSH - Hemoglobin A1c - Insulin, random - VITAMIN D 25 Hydroxyl                                                                                                  Patient was counseled in prudent diet, weight control to achieve/maintain BMI less than 25, BP monitoring, regular exercise and medications as discussed.  Discussed med effects and SE's. Routine screening labs and tests as requested with regular follow-up as recommended. Over 40 minutes of exam, counseling, chart review and high complex critical decision making was performed

## 2017-06-26 NOTE — Patient Instructions (Signed)

## 2017-06-27 ENCOUNTER — Encounter: Payer: Self-pay | Admitting: Internal Medicine

## 2017-06-27 LAB — MAGNESIUM: MAGNESIUM: 2.2 mg/dL (ref 1.5–2.5)

## 2017-06-27 LAB — IRON, TOTAL/TOTAL IRON BINDING CAP
%SAT: 14 % (calc) — ABNORMAL LOW (ref 15–60)
Iron: 58 ug/dL (ref 50–180)
TIBC: 402 ug/dL (ref 250–425)

## 2017-06-27 LAB — COMPLETE METABOLIC PANEL WITH GFR
AG Ratio: 2 (calc) (ref 1.0–2.5)
ALT: 27 U/L (ref 9–46)
AST: 36 U/L — ABNORMAL HIGH (ref 10–35)
Albumin: 4.3 g/dL (ref 3.6–5.1)
Alkaline phosphatase (APISO): 58 U/L (ref 40–115)
BILIRUBIN TOTAL: 0.6 mg/dL (ref 0.2–1.2)
BUN: 8 mg/dL (ref 7–25)
CALCIUM: 9.5 mg/dL (ref 8.6–10.3)
CHLORIDE: 102 mmol/L (ref 98–110)
CO2: 26 mmol/L (ref 20–32)
Creat: 0.94 mg/dL (ref 0.70–1.33)
GFR, EST AFRICAN AMERICAN: 106 mL/min/{1.73_m2} (ref >=60)
GFR, Est Non African American: 92 mL/min/{1.73_m2} (ref >=60)
GLUCOSE: 79 mg/dL (ref 65–99)
Globulin: 2.2 g/dL (calc) (ref 1.9–3.7)
Potassium: 4 mmol/L (ref 3.5–5.3)
Sodium: 136 mmol/L (ref 135–146)
TOTAL PROTEIN: 6.5 g/dL (ref 6.1–8.1)

## 2017-06-27 LAB — PSA: PSA: 0.4 ng/mL (ref <=4.0)

## 2017-06-27 LAB — CBC WITH DIFFERENTIAL/PLATELET
Basophils Absolute: 59 cells/uL (ref 0–200)
Basophils Relative: 1.3 %
EOS ABS: 162 {cells}/uL (ref 15–500)
Eosinophils Relative: 3.6 %
HCT: 35.3 % — ABNORMAL LOW (ref 38.5–50.0)
HEMOGLOBIN: 12.3 g/dL — AB (ref 13.2–17.1)
Lymphs Abs: 1103 cells/uL (ref 850–3900)
MCH: 30.6 pg (ref 27.0–33.0)
MCHC: 34.8 g/dL (ref 32.0–36.0)
MCV: 87.8 fL (ref 80.0–100.0)
MONOS PCT: 12.8 %
MPV: 9.5 fL (ref 7.5–12.5)
NEUTROS ABS: 2601 {cells}/uL (ref 1500–7800)
Neutrophils Relative %: 57.8 %
PLATELETS: 310 10*3/uL (ref 140–400)
RBC: 4.02 10*6/uL — AB (ref 4.20–5.80)
RDW: 12.6 % (ref 11.0–15.0)
TOTAL LYMPHOCYTE: 24.5 %
WBC: 4.5 10*3/uL (ref 3.8–10.8)
WBCMIX: 576 {cells}/uL (ref 200–950)

## 2017-06-27 LAB — HEMOGLOBIN A1C
Hgb A1c MFr Bld: 5 % of total Hgb (ref <5.7)
Mean Plasma Glucose: 97 (calc)
eAG (mmol/L): 5.4 (calc)

## 2017-06-27 LAB — TSH: TSH: 1.07 m[IU]/L (ref 0.40–4.50)

## 2017-06-27 LAB — LIPID PANEL
Cholesterol: 142 mg/dL (ref <200)
HDL: 68 mg/dL (ref >40)
LDL Cholesterol (Calc): 50 mg/dL (calc)
NON-HDL CHOLESTEROL (CALC): 74 mg/dL (ref <130)
Total CHOL/HDL Ratio: 2.1 (calc) (ref <5.0)
Triglycerides: 154 mg/dL — ABNORMAL HIGH (ref <150)

## 2017-06-27 LAB — VITAMIN D 25 HYDROXY (VIT D DEFICIENCY, FRACTURES): VIT D 25 HYDROXY: 68 ng/mL (ref 30–100)

## 2017-06-27 LAB — TESTOSTERONE: Testosterone: 381 ng/dL (ref 250–827)

## 2017-06-27 LAB — INSULIN, RANDOM: INSULIN: 4.9 u[IU]/mL (ref 2.0–19.6)

## 2017-06-27 LAB — VITAMIN B12: Vitamin B-12: 370 pg/mL (ref 200–1100)

## 2017-06-28 ENCOUNTER — Other Ambulatory Visit: Payer: Self-pay | Admitting: Adult Health

## 2017-07-10 DIAGNOSIS — M5416 Radiculopathy, lumbar region: Secondary | ICD-10-CM | POA: Diagnosis not present

## 2017-07-23 DIAGNOSIS — M545 Low back pain: Secondary | ICD-10-CM | POA: Diagnosis not present

## 2017-07-25 ENCOUNTER — Other Ambulatory Visit: Payer: Self-pay | Admitting: Physician Assistant

## 2017-07-27 DIAGNOSIS — M5416 Radiculopathy, lumbar region: Secondary | ICD-10-CM | POA: Diagnosis not present

## 2017-08-16 DIAGNOSIS — Z09 Encounter for follow-up examination after completed treatment for conditions other than malignant neoplasm: Secondary | ICD-10-CM | POA: Diagnosis not present

## 2017-08-16 DIAGNOSIS — Z96652 Presence of left artificial knee joint: Secondary | ICD-10-CM | POA: Diagnosis not present

## 2017-08-16 DIAGNOSIS — Z96651 Presence of right artificial knee joint: Secondary | ICD-10-CM | POA: Diagnosis not present

## 2017-08-30 ENCOUNTER — Other Ambulatory Visit: Payer: Self-pay | Admitting: Internal Medicine

## 2017-08-30 ENCOUNTER — Encounter: Payer: Self-pay | Admitting: Internal Medicine

## 2017-08-30 DIAGNOSIS — M5432 Sciatica, left side: Secondary | ICD-10-CM

## 2017-08-30 MED ORDER — PREDNISONE 20 MG PO TABS: ORAL_TABLET | ORAL | 1 refills | Status: DC

## 2017-09-01 ENCOUNTER — Other Ambulatory Visit: Payer: Self-pay | Admitting: Internal Medicine

## 2017-09-01 DIAGNOSIS — E782 Mixed hyperlipidemia: Secondary | ICD-10-CM

## 2017-09-13 ENCOUNTER — Other Ambulatory Visit: Payer: Self-pay | Admitting: Adult Health

## 2017-09-13 DIAGNOSIS — E782 Mixed hyperlipidemia: Secondary | ICD-10-CM

## 2017-09-14 ENCOUNTER — Other Ambulatory Visit: Payer: Self-pay | Admitting: Internal Medicine

## 2017-09-14 DIAGNOSIS — I10 Essential (primary) hypertension: Secondary | ICD-10-CM

## 2017-09-16 ENCOUNTER — Other Ambulatory Visit: Payer: Self-pay | Admitting: Internal Medicine

## 2017-09-16 ENCOUNTER — Encounter: Payer: Self-pay | Admitting: Internal Medicine

## 2017-09-16 MED ORDER — GABAPENTIN 600 MG PO TABS: ORAL_TABLET | ORAL | 1 refills | Status: DC

## 2017-09-21 ENCOUNTER — Other Ambulatory Visit: Payer: Self-pay | Admitting: Internal Medicine

## 2017-09-21 DIAGNOSIS — E782 Mixed hyperlipidemia: Secondary | ICD-10-CM

## 2017-09-21 MED ORDER — FENOFIBRATE 145 MG PO TABS: ORAL_TABLET | ORAL | 3 refills | Status: DC

## 2017-10-02 DIAGNOSIS — M5416 Radiculopathy, lumbar region: Secondary | ICD-10-CM | POA: Diagnosis not present

## 2017-10-09 ENCOUNTER — Other Ambulatory Visit: Payer: Self-pay | Admitting: Internal Medicine

## 2017-10-12 ENCOUNTER — Encounter: Payer: Self-pay | Admitting: Adult Health

## 2017-10-12 DIAGNOSIS — Z6825 Body mass index (BMI) 25.0-25.9, adult: Secondary | ICD-10-CM | POA: Insufficient documentation

## 2017-10-12 NOTE — Progress Notes (Signed)
FOLLOW UP  Assessment and Plan:   Hypertension Well controlled with current medications  Monitor blood pressure at home; patient to call if consistently greater than 130/80 Continue DASH diet.   Reminder to go to the ER if any CP, SOB, nausea, dizziness, severe HA, changes vision/speech, left arm numbness and tingling and jaw pain.  Cholesterol Currently at goal; on zetia 10 mg, rosuvastatin 20 mg twice weekly - if at goal today will try d/c'ing zetia Continue low cholesterol diet and exercise.  Check lipid panel.   Other abnormal glucose Recent A1Cs exceedingly well controlled Continue diet and exercise.  Perform daily foot/skin check, notify office of any concerning changes.  Defer A1C; check annually; check CMP today  Vitamin D Def At goal at recent check; continue to recommend supplementation for goal of 70-100 Defer vitamin D level   Continue diet and meds as discussed. Further disposition pending results of labs. Discussed med's effects and SE's.   Over 30 minutes of exam, counseling, chart review, and critical decision making was performed.   Future Appointments  Date Time Provider Department Center  01/15/2018  4:30 PM Lucky Cowboy, MD [facility] None  07/31/2018  3:00 PM Lucky Cowboy, MD [facility] None    ----------------------------------------------------------------------------------------------------------------------  HPI 57 y.o. male  presents for 3 month follow up on hypertension, cholesterol, glucose management, weight and vitamin D deficiency. He is following with Dr. Yevette Edwards for L4-5 left radicular pain, had injections, discussing discectomy with fusion this Fall.   BMI is Body mass index is 24.96 kg/m., he has been working on diet and exercise. Goes to gym 3 times a week, 40 minutes and weights. Also walks extensively at work.  Wt Readings from Last 3 Encounters:  10/15/17 169 lb (76.7 kg)  06/26/17 170 lb 12.8 oz (77.5 kg)  03/29/17 165 lb  (74.8 kg)   His blood pressure has been controlled at home, today their BP is BP: 118/66  He does workout. He denies chest pain, shortness of breath, dizziness.   He is on cholesterol medication (zetia 10 mg daily, rosuvastatin 20 mg twice a week) and denies myalgias. His LDL cholesterol is at goal; trigs were slightly elevated. The cholesterol last visit was:   Lab Results  Component Value Date   CHOL 142 06/26/2017   HDL 68 06/26/2017   LDLCALC 50 06/26/2017   TRIG 154 (H) 06/26/2017   CHOLHDL 2.1 06/26/2017    He has been working on diet and exercise for glucose management, and denies increased appetite, nausea, paresthesia of the feet, polydipsia, polyuria and visual disturbances. Last A1C in the office was:  Lab Results  Component Value Date   HGBA1C 5.0 06/26/2017   Patient is on vitamin D supplement and at goal at the last check:    Lab Results  Component Value Date   VD25OH 68 06/26/2017       Current Medications:  Current Outpatient Medications on File Prior to Visit  Medication Sig  . Cholecalciferol (VITAMIN D-3) 5000 UNITS TABS Take 15,000 Units by mouth daily. Reported on 08/11/2015  . Diclofenac Sodium CR 100 MG 24 hr tablet TAKE 1 TABLET DAILY  . ezetimibe (ZETIA) 10 MG tablet TAKE 1 TABLET DAILY  . gabapentin (NEURONTIN) 600 MG tablet Take 1/2 to 1 tablet 3 to 4 x /day as needed for pain  . lisinopril-hydrochlorothiazide (PRINZIDE,ZESTORETIC) 20-25 MG tablet TAKE 1 TABLET DAILY FOR BLOOD PRESSURE  . POTASSIUM PO Take 1 tablet by mouth daily.  . rosuvastatin (CRESTOR) 20  MG tablet TAKE 1 TABLET(20 MG) BY MOUTH DAILY  . aspirin EC 325 MG tablet Take 1 tablet (325 mg total) by mouth 2 (two) times daily. (Patient not taking: Reported on 10/15/2017)   No current facility-administered medications on file prior to visit.      Allergies: No Known Allergies   Medical History:  Past Medical History:  Diagnosis Date  . Arthritis   . Hyperlipidemia   . Hypertension     Family history- Reviewed and unchanged Social history- Reviewed and unchanged   Review of Systems:  Review of Systems  Constitutional: Negative for malaise/fatigue and weight loss.  HENT: Negative for hearing loss and tinnitus.   Eyes: Negative for blurred vision and double vision.  Respiratory: Negative for cough, shortness of breath and wheezing.   Cardiovascular: Negative for chest pain, palpitations, orthopnea, claudication and leg swelling.  Gastrointestinal: Negative for abdominal pain, blood in stool, constipation, diarrhea, heartburn, melena, nausea and vomiting.  Genitourinary: Negative.   Musculoskeletal: Negative for back pain, joint pain and myalgias.       Radicular left lower extremity pain  Skin: Negative for rash.  Neurological: Negative for dizziness, tingling, sensory change, weakness and headaches.  Endo/Heme/Allergies: Negative for polydipsia.  Psychiatric/Behavioral: Negative.   All other systems reviewed and are negative.    Physical Exam: BP 118/66   Pulse 88   Temp (!) 97.5 F (36.4 C)   Ht 5\' 9"  (1.753 m)   Wt 169 lb (76.7 kg)   SpO2 98%   BMI 24.96 kg/m  Wt Readings from Last 3 Encounters:  10/15/17 169 lb (76.7 kg)  06/26/17 170 lb 12.8 oz (77.5 kg)  03/29/17 165 lb (74.8 kg)   General Appearance: Well nourished, in no apparent distress. Eyes: PERRLA, EOMs, conjunctiva no swelling or erythema Sinuses: No Frontal/maxillary tenderness ENT/Mouth: Ext aud canals clear, TMs without erythema, bulging. No erythema, swelling, or exudate on post pharynx.  Tonsils not swollen or erythematous. Hearing normal.  Neck: Supple, thyroid normal.  Respiratory: Respiratory effort normal, BS equal bilaterally without rales, rhonchi, wheezing or stridor.  Cardio: RRR with no MRGs. Brisk peripheral pulses without edema.  Abdomen: Soft, + BS.  Non tender, no guarding, rebound, hernias, masses. Lymphatics: Non tender without lymphadenopathy.  Musculoskeletal:  Full ROM, 5/5 strength, Normal gait Skin: Warm, dry without rashes, lesions, ecchymosis.  Neuro: Cranial nerves intact. No cerebellar symptoms.  Psych: Awake and oriented X 3, normal affect, Insight and Judgment appropriate.    Dan MakerAshley C Daniell Paradise, NP 4:43 PM [facility]

## 2017-10-15 ENCOUNTER — Encounter: Payer: Self-pay | Admitting: Adult Health

## 2017-10-15 ENCOUNTER — Ambulatory Visit (INDEPENDENT_AMBULATORY_CARE_PROVIDER_SITE_OTHER): Payer: Commercial Managed Care - PPO | Admitting: Adult Health

## 2017-10-15 VITALS — BP 118/66 | HR 88 | Temp 97.5°F | Ht 69.0 in | Wt 169.0 lb

## 2017-10-15 DIAGNOSIS — E782 Mixed hyperlipidemia: Secondary | ICD-10-CM | POA: Diagnosis not present

## 2017-10-15 DIAGNOSIS — E559 Vitamin D deficiency, unspecified: Secondary | ICD-10-CM

## 2017-10-15 DIAGNOSIS — R7309 Other abnormal glucose: Secondary | ICD-10-CM | POA: Diagnosis not present

## 2017-10-15 DIAGNOSIS — Z79899 Other long term (current) drug therapy: Secondary | ICD-10-CM | POA: Diagnosis not present

## 2017-10-15 DIAGNOSIS — I10 Essential (primary) hypertension: Secondary | ICD-10-CM | POA: Diagnosis not present

## 2017-10-15 DIAGNOSIS — S39012A Strain of muscle, fascia and tendon of lower back, initial encounter: Secondary | ICD-10-CM

## 2017-10-15 DIAGNOSIS — Z6825 Body mass index (BMI) 25.0-25.9, adult: Secondary | ICD-10-CM

## 2017-10-15 MED ORDER — CYCLOBENZAPRINE HCL 10 MG PO TABS: ORAL_TABLET | ORAL | 0 refills | Status: DC

## 2017-10-15 NOTE — Patient Instructions (Signed)
Goals    . LDL CALC < 100      Restart aspirin at 81 mg    If cholesterol is good today, will try stopping zetia, continue with rosuvastatin 20 mg twice weekly    Food Choices to Lower Your Triglycerides Triglycerides are a type of fat in your blood. High levels of triglycerides can increase the risk of heart disease and stroke. If your triglyceride levels are high, the foods you eat and your eating habits are very important. Choosing the right foods can help lower your triglycerides. What general guidelines do I need to follow?  Lose weight if you are overweight.  Limit or avoid alcohol.  Fill one half of your plate with vegetables and green salads.  Limit fruit to two servings a day. Choose fruit instead of juice.  Make one fourth of your plate whole grains. Look for the word "whole" as the first word in the ingredient list.  Fill one fourth of your plate with lean protein foods.  Enjoy fatty fish (such as salmon, mackerel, sardines, and tuna) three times a week.  Choose healthy fats.  Limit foods high in starch and sugar.  Eat more home-cooked food and less restaurant, buffet, and fast food.  Limit fried foods.  Cook foods using methods other than frying.  Limit saturated fats.  Check ingredient lists to avoid foods with partially hydrogenated oils (trans fats) in them. What foods can I eat? Grains Whole grains, such as whole wheat or whole grain breads, crackers, cereals, and pasta. Unsweetened oatmeal, bulgur, barley, quinoa, or brown rice. Corn or whole wheat flour tortillas. Vegetables Fresh or frozen vegetables (raw, steamed, roasted, or grilled). Green salads. Fruits All fresh, canned (in natural juice), or frozen fruits. Meat and Other Protein Products Ground beef (85% or leaner), grass-fed beef, or beef trimmed of fat. Skinless chicken or [place]. Ground chicken or [place]. Pork trimmed of fat. All fish and seafood. Eggs. Dried beans, peas, or lentils.  Unsalted nuts or seeds. Unsalted canned or dry beans. Dairy Low-fat dairy products, such as skim or 1% milk, 2% or reduced-fat cheeses, low-fat ricotta or cottage cheese, or plain low-fat yogurt. Fats and Oils Tub margarines without trans fats. Light or reduced-fat mayonnaise and salad dressings. Avocado. Safflower, olive, or canola oils. Natural peanut or almond butter. The items listed above may not be a complete list of recommended foods or beverages. Contact your dietitian for more options. What foods are not recommended? Grains White bread. White pasta. White rice. Cornbread. Bagels, pastries, and croissants. Crackers that contain trans fat. Vegetables White potatoes. Corn. Creamed or fried vegetables. Vegetables in a cheese sauce. Fruits Dried fruits. Canned fruit in light or heavy syrup. Fruit juice. Meat and Other Protein Products Fatty cuts of meat. Ribs, chicken wings, bacon, sausage, bologna, salami, chitterlings, fatback, hot dogs, bratwurst, and packaged luncheon meats. Dairy Whole or 2% milk, cream, half-and-half, and cream cheese. Whole-fat or sweetened yogurt. Full-fat cheeses. Nondairy creamers and whipped toppings. Processed cheese, cheese spreads, or cheese curds. Sweets and Desserts Corn syrup, sugars, honey, and molasses. Candy. Jam and jelly. Syrup. Sweetened cereals. Cookies, pies, cakes, donuts, muffins, and ice cream. Fats and Oils Butter, stick margarine, lard, shortening, ghee, or bacon fat. Coconut, palm kernel, or palm oils. Beverages Alcohol. Sweetened drinks (such as sodas, lemonade, and fruit drinks or punches). The items listed above may not be a complete list of foods and beverages to avoid. Contact your dietitian for more information. This information is not intended  to replace advice given to you by your health care provider. Make sure you discuss any questions you have with your health care provider. Document Released: 11/25/2003 Document Revised:  07/15/2015 Document Reviewed: 12/11/2012 Elsevier Interactive Patient Education  2017 [employer]

## 2017-10-16 ENCOUNTER — Encounter: Payer: Self-pay | Admitting: Adult Health

## 2017-10-16 DIAGNOSIS — M48062 Spinal stenosis, lumbar region with neurogenic claudication: Secondary | ICD-10-CM | POA: Insufficient documentation

## 2017-10-16 LAB — CBC WITH DIFFERENTIAL/PLATELET
BASOS ABS: 32 {cells}/uL (ref 0–200)
Basophils Relative: 0.6 %
EOS PCT: 0 %
Eosinophils Absolute: 0 cells/uL — ABNORMAL LOW (ref 15–500)
HCT: 37.3 % — ABNORMAL LOW (ref 38.5–50.0)
Hemoglobin: 12.8 g/dL — ABNORMAL LOW (ref 13.2–17.1)
Lymphs Abs: 1224 cells/uL (ref 850–3900)
MCH: 31.1 pg (ref 27.0–33.0)
MCHC: 34.3 g/dL (ref 32.0–36.0)
MCV: 90.8 fL (ref 80.0–100.0)
MPV: 9.7 fL (ref 7.5–12.5)
Monocytes Relative: 12.5 %
NEUTROS PCT: 63.8 %
Neutro Abs: 3381 cells/uL (ref 1500–7800)
Platelets: 304 10*3/uL (ref 140–400)
RBC: 4.11 10*6/uL — ABNORMAL LOW (ref 4.20–5.80)
RDW: 12.5 % (ref 11.0–15.0)
Total Lymphocyte: 23.1 %
WBC mixed population: 663 cells/uL (ref 200–950)
WBC: 5.3 10*3/uL (ref 3.8–10.8)

## 2017-10-16 LAB — COMPLETE METABOLIC PANEL WITH GFR
AG Ratio: 2 (calc) (ref 1.0–2.5)
ALKALINE PHOSPHATASE (APISO): 59 U/L (ref 40–115)
ALT: 22 U/L (ref 9–46)
AST: 26 U/L (ref 10–35)
Albumin: 4.8 g/dL (ref 3.6–5.1)
BUN: 12 mg/dL (ref 7–25)
CO2: 30 mmol/L (ref 20–32)
CREATININE: 1.03 mg/dL (ref 0.70–1.33)
Calcium: 10 mg/dL (ref 8.6–10.3)
Chloride: 96 mmol/L — ABNORMAL LOW (ref 98–110)
GFR, Est African American: 95 mL/min/{1.73_m2} (ref >=60)
GFR, Est Non African American: 82 mL/min/{1.73_m2} (ref >=60)
GLOBULIN: 2.4 g/dL (ref 1.9–3.7)
GLUCOSE: 98 mg/dL (ref 65–99)
Potassium: 3.8 mmol/L (ref 3.5–5.3)
SODIUM: 135 mmol/L (ref 135–146)
Total Bilirubin: 0.6 mg/dL (ref 0.2–1.2)
Total Protein: 7.2 g/dL (ref 6.1–8.1)

## 2017-10-16 LAB — LIPID PANEL
CHOLESTEROL: 173 mg/dL (ref <200)
HDL: 83 mg/dL (ref >40)
LDL Cholesterol (Calc): 72 mg/dL (calc)
Non-HDL Cholesterol (Calc): 90 mg/dL (calc) (ref <130)
TRIGLYCERIDES: 96 mg/dL (ref <150)
Total CHOL/HDL Ratio: 2.1 (calc) (ref <5.0)

## 2017-10-16 LAB — TSH: TSH: 1.04 mIU/L (ref 0.40–4.50)

## 2017-10-27 ENCOUNTER — Other Ambulatory Visit: Payer: Self-pay | Admitting: Internal Medicine

## 2017-10-27 MED ORDER — PREDNISONE 20 MG PO TABS: ORAL_TABLET | ORAL | 1 refills | Status: DC

## 2017-11-01 ENCOUNTER — Other Ambulatory Visit: Payer: Self-pay | Admitting: Physician Assistant

## 2017-11-01 ENCOUNTER — Encounter: Payer: Self-pay | Admitting: Physician Assistant

## 2017-11-01 ENCOUNTER — Ambulatory Visit (INDEPENDENT_AMBULATORY_CARE_PROVIDER_SITE_OTHER): Payer: Commercial Managed Care - PPO | Admitting: Physician Assistant

## 2017-11-01 VITALS — BP 100/62 | HR 73 | Temp 98.4°F | Resp 14 | Ht 69.0 in | Wt 169.6 lb

## 2017-11-01 DIAGNOSIS — R42 Dizziness and giddiness: Secondary | ICD-10-CM

## 2017-11-01 DIAGNOSIS — K921 Melena: Secondary | ICD-10-CM

## 2017-11-01 LAB — CBC WITH DIFFERENTIAL/PLATELET
BASOS PCT: 0.6 %
Basophils Absolute: 76 cells/uL (ref 0–200)
EOS ABS: 0 {cells}/uL — AB (ref 15–500)
Eosinophils Relative: 0 %
HCT: 31 % — ABNORMAL LOW (ref 38.5–50.0)
Hemoglobin: 10.5 g/dL — ABNORMAL LOW (ref 13.2–17.1)
Lymphs Abs: 2797 cells/uL (ref 850–3900)
MCH: 30.6 pg (ref 27.0–33.0)
MCHC: 33.9 g/dL (ref 32.0–36.0)
MCV: 90.4 fL (ref 80.0–100.0)
MONOS PCT: 9.8 %
MPV: 9.9 fL (ref 7.5–12.5)
NEUTROS PCT: 67.4 %
Neutro Abs: 8492 cells/uL — ABNORMAL HIGH (ref 1500–7800)
PLATELETS: 422 10*3/uL — AB (ref 140–400)
RBC: 3.43 10*6/uL — ABNORMAL LOW (ref 4.20–5.80)
RDW: 12.5 % (ref 11.0–15.0)
TOTAL LYMPHOCYTE: 22.2 %
WBC mixed population: 1235 cells/uL — ABNORMAL HIGH (ref 200–950)
WBC: 12.6 10*3/uL — AB (ref 3.8–10.8)

## 2017-11-01 LAB — IRON, TOTAL/TOTAL IRON BINDING CAP
%SAT: 66 % (calc) — ABNORMAL HIGH (ref 20–48)
IRON: 248 ug/dL — AB (ref 50–180)
TIBC: 374 ug/dL (ref 250–425)

## 2017-11-01 LAB — COMPLETE METABOLIC PANEL WITH GFR
AG RATIO: 2.2 (calc) (ref 1.0–2.5)
ALT: 18 U/L (ref 9–46)
AST: 14 U/L (ref 10–35)
Albumin: 4.1 g/dL (ref 3.6–5.1)
Alkaline phosphatase (APISO): 37 U/L — ABNORMAL LOW (ref 40–115)
BILIRUBIN TOTAL: 0.5 mg/dL (ref 0.2–1.2)
BUN/Creatinine Ratio: 52 (calc) — ABNORMAL HIGH (ref 6–22)
BUN: 56 mg/dL — AB (ref 7–25)
CHLORIDE: 94 mmol/L — AB (ref 98–110)
CO2: 26 mmol/L (ref 20–32)
Calcium: 9.3 mg/dL (ref 8.6–10.3)
Creat: 1.08 mg/dL (ref 0.70–1.33)
GFR, Est African American: 90 mL/min/{1.73_m2} (ref >=60)
GFR, Est Non African American: 77 mL/min/{1.73_m2} (ref >=60)
GLUCOSE: 91 mg/dL (ref 65–99)
Globulin: 1.9 g/dL (calc) (ref 1.9–3.7)
POTASSIUM: 4 mmol/L (ref 3.5–5.3)
Sodium: 129 mmol/L — ABNORMAL LOW (ref 135–146)
TOTAL PROTEIN: 6 g/dL — AB (ref 6.1–8.1)

## 2017-11-01 LAB — RETICULOCYTES
ABS Retic: 78200 cells/uL (ref 25000–9000)
Retic Ct Pct: 2.3 %

## 2017-11-01 LAB — FERRITIN: Ferritin: 74 ng/mL (ref 38–380)

## 2017-11-01 MED ORDER — OMEPRAZOLE 40 MG PO CPDR: 40.0000 mg | DELAYED_RELEASE_CAPSULE | Freq: Two times a day (BID) | ORAL | 1 refills | Status: DC

## 2017-11-01 NOTE — Progress Notes (Signed)
Subjective:    Patient ID: Jermaine NordmannJeffrey D Stewart, male    DOB: 03-10-1960, 57 y.o.   MRN: [id number]   Assessment & Plan:     Melena with dizziness, weakness, no CP, SOB, AB pain- likely NSAID induced bleeding ulcer however with wife having similar symptoms should rule out Hpylori  Check CBC, BMP, will need Hyplori testing at GI due to PPI use, Refer to GI for  EGD, Stop NSAIDS/ASA/ETOH/PREDNISONE, bland diet, increase fluids, If symptoms worsen, get CP, SOB, dizziness, etc go to the ER -     CBC with Differential/Platelet -     COMPLETE METABOLIC PANEL WITH GFR -     Iron,Total/Total Iron Binding Cap -     Ferritin -     Reticulocytes -     Ambulatory referral to Gastroenterology  -     omeprazole (PRILOSEC) 40 MG capsule; Take 1 capsule (40 mg total) by mouth 2 (two) times daily.     HPI   57 y.o. WM presents with black stool x 2 days.  He had iceburg lettuce salad for lunch from a restaurant, that night had AB bloating then had 2 BMs that night with black stool. He was having some epigastric pain. Took prilosec this AM.  In the shower this AM felt so dizzy, chills and almost passed out. He is yawning, severe fatigue. Had 4 BMs this morning with black stool.   Dad drove him due to weakness/dizziness.  He is on 325 mg ASA daily, will take prednisone PRN for lower back, last time was 09/09, no other NSAIDS use, occ beer/ETOH use. No diarrhea/constipation past 3 months. Wife is being seen for similar symptoms, both [facility] need [facility]    Did cologuard. - need to find results  Blood pressure 100/62, pulse 73, temperature 98.4 F (36.9 C), resp. rate 14, height 5\' 9"  (1.753 m), weight 169 lb 9.6 oz (76.9 kg), SpO2 99 %.  Medications Current Outpatient Medications on File Prior to Visit  Medication Sig  . aspirin EC 325 MG tablet Take 1 tablet (325 mg total) by mouth 2 (two) times daily.  . Cholecalciferol (VITAMIN D-3) 5000 UNITS TABS Take 15,000 Units by mouth  daily. Reported on 08/11/2015  . cyclobenzaprine (FLEXERIL) 10 MG tablet TAKE 1/2 TO 1 TABLET BY MOUTH THREE TIMES DAILY AS NEEDED FOR MUSCLE SPASM OR PAIN  . Diclofenac Sodium CR 100 MG 24 hr tablet TAKE 1 TABLET DAILY  . ezetimibe (ZETIA) 10 MG tablet TAKE 1 TABLET DAILY  . gabapentin (NEURONTIN) 600 MG tablet Take 1/2 to 1 tablet 3 to 4 x /day as needed for pain  . lisinopril-hydrochlorothiazide (PRINZIDE,ZESTORETIC) 20-25 MG tablet TAKE 1 TABLET DAILY FOR BLOOD PRESSURE  . POTASSIUM PO Take 1 tablet by mouth daily.  . rosuvastatin (CRESTOR) 20 MG tablet TAKE 1 TABLET(20 MG) BY MOUTH DAILY   No current facility-administered medications on file prior to visit.     Problem list He has Vitamin D deficiency; Hyperlipidemia; Essential hypertension; Other abnormal glucose; Medication management; Osteoarthritis of right knee; BMI 25.0-25.9,adult; and Spinal stenosis, lumbar region, with neurogenic claudication on their problem list.   Review of Systems  Constitutional: Positive for chills and fatigue. Negative for fever.  HENT: Negative.   Respiratory: Negative.   Cardiovascular: Negative.   Gastrointestinal: Positive for abdominal pain and blood in stool. Negative for abdominal distention, anal bleeding, constipation, diarrhea, nausea, rectal pain and vomiting.  Genitourinary: Negative.   Musculoskeletal: Negative.  Skin: Positive for pallor.  Neurological: Positive for dizziness.       Objective:   Physical Exam  Constitutional: He is oriented to person, place, and time. He appears well-developed and well-nourished.  Clammy, palor  HENT:  Head: Normocephalic and atraumatic.  Right Ear: External ear normal.  Left Ear: External ear normal.  Mouth/Throat: Oropharynx is clear and moist.  Eyes: Pupils are equal, round, and reactive to light. Conjunctivae and EOM are normal.  Neck: Normal range of motion. Neck supple.  Cardiovascular: Normal rate, regular rhythm and normal heart  sounds.  Pulmonary/Chest: Effort normal and breath sounds normal.  Abdominal: Soft. Bowel sounds are normal. He exhibits no mass. There is no tenderness (nontender, no rebound, no guarding). There is no rebound and no guarding.  Genitourinary: Rectal exam shows guaiac positive stool.  Musculoskeletal: Normal range of motion.  Neurological: He is alert and oriented to person, place, and time. No cranial nerve deficit.  Skin: Skin is warm and dry.

## 2017-11-01 NOTE — Patient Instructions (Signed)
Take prilosec twice a day Will go to the ER if they have any worseing melena (black stool), coffee ground vomiting, severe pain, severe shortness of breath or chest pain.    Peptic Ulcer A peptic ulcer is a sore in the lining of the esophagus (esophageal ulcer), the stomach (gastric ulcer), or the first part of the small intestine (duodenal ulcer). The ulcer causes gradual wearing away (erosion) into the deeper tissue. What are the causes? Normally, the lining of the stomach and the small intestine protects itself from the acid that digests food. The protective lining can be damaged by:  An infection caused by a germ (bacterium) called Helicobacter pylori or H. pylori.  Regular use of NSAIDs, such as ibuprofen or aspirin.  Rare tumors in the stomach, small intestine, or pancreas (Zollinger-Ellison syndrome).  What increases the risk? The following factors may make you more likely to develop this condition:  Smoking.  Having a family history of ulcer disease.  What are the signs or symptoms? Symptoms of this condition include:  Burning pain or gnawing in the area between the chest and the belly button. The pain may be worse on an empty stomach and at night.  Heartburn.  Nausea and vomiting.  Bloating.  If the ulcer results in bleeding, it can cause:  Black, tarry stools.  Vomiting of bright red blood.  Vomiting of material that looks like coffee grounds.  How is this diagnosed? This condition may be diagnosed based on:  Medical history and physical exam.  Various tests or procedures, such as: ? Blood tests, stool tests, or breath tests to check for the H. pylori bacterium. ? An X-ray exam (upper gastrointestinal series) of the esophagus, stomach, and small intestine. ? Upper endoscopy. The health care provider examines the esophagus, stomach, and small intestine using a small flexible tube that has a video camera at the end. ? Biopsy. A tissue sample is removed to be  examined under a microscope.  How is this treated? Treatment for this condition may include:  Eliminating the cause of the ulcer, such as smoking or the use of NSAIDs or alcohol.  Medicines to reduce the amount of acid in your digestive tract.  Antibiotic medicines, if the ulcer is caused by the H. pylori bacterium.  An upper endoscopy to treat a bleeding ulcer.  Surgery, if the bleeding is severe or if the ulcer created a hole somewhere in the digestive system.  Follow these instructions at home:  Avoid alcohol and caffeine.  Do not use any tobacco products, such as cigarettes, chewing tobacco, and e-cigarettes. If you need help quitting, ask your health care provider.  Take over-the-counter and prescription medicines only as told by your health care provider. Do not use over-the-counter medicines in place of prescription medicines unless your health care provider approves.  Keep all follow-up visits as told by your health care provider. This is important. Contact a health care provider if:  Your symptoms do not improve within 7 days of starting treatment.  You have ongoing indigestion or heartburn. Get help right away if:  You have sudden, sharp, or persistent pain in your abdomen.  You have bloody or dark black, tarry stools.  You vomit blood or material that looks like coffee grounds.  You become light-headed or you feel faint.  You become weak.  You become sweaty or clammy. This information is not intended to replace advice given to you by your health care provider. Make sure you discuss any questions you  have with your health care provider. Document Released: 02/04/2000 Document Revised: 07/12/2015 Document Reviewed: 11/07/2014 Elsevier Interactive Patient Education  [employer]2018 [employer]

## 2017-11-02 ENCOUNTER — Other Ambulatory Visit: Payer: Commercial Managed Care - PPO

## 2017-11-02 DIAGNOSIS — R42 Dizziness and giddiness: Secondary | ICD-10-CM | POA: Diagnosis not present

## 2017-11-02 LAB — CBC WITH DIFFERENTIAL/PLATELET
BASOS ABS: 29 {cells}/uL (ref 0–200)
Basophils Relative: 0.4 %
EOS ABS: 0 {cells}/uL — AB (ref 15–500)
EOS PCT: 0 %
HEMATOCRIT: 27.1 % — AB (ref 38.5–50.0)
HEMOGLOBIN: 9.2 g/dL — AB (ref 13.2–17.1)
LYMPHS ABS: 1424 {cells}/uL (ref 850–3900)
MCH: 30.9 pg (ref 27.0–33.0)
MCHC: 33.9 g/dL (ref 32.0–36.0)
MCV: 90.9 fL (ref 80.0–100.0)
MPV: 10.3 fL (ref 7.5–12.5)
Monocytes Relative: 6.6 %
NEUTROS ABS: 5366 {cells}/uL (ref 1500–7800)
NEUTROS PCT: 73.5 %
Platelets: 295 10*3/uL (ref 140–400)
RBC: 2.98 10*6/uL — ABNORMAL LOW (ref 4.20–5.80)
RDW: 12.2 % (ref 11.0–15.0)
Total Lymphocyte: 19.5 %
WBC: 7.3 10*3/uL (ref 3.8–10.8)
WBCMIX: 482 {cells}/uL (ref 200–950)

## 2017-11-02 LAB — BASIC METABOLIC PANEL WITH GFR
BUN: 23 mg/dL (ref 7–25)
CALCIUM: 9 mg/dL (ref 8.6–10.3)
CO2: 26 mmol/L (ref 20–32)
Chloride: 98 mmol/L (ref 98–110)
Creat: 1.1 mg/dL (ref 0.70–1.33)
GFR, EST NON AFRICAN AMERICAN: 76 mL/min/{1.73_m2} (ref >=60)
GFR, Est African American: 88 mL/min/{1.73_m2} (ref >=60)
Glucose, Bld: 136 mg/dL — ABNORMAL HIGH (ref 65–99)
POTASSIUM: 4.2 mmol/L (ref 3.5–5.3)
Sodium: 131 mmol/L — ABNORMAL LOW (ref 135–146)

## 2017-11-06 ENCOUNTER — Ambulatory Visit (INDEPENDENT_AMBULATORY_CARE_PROVIDER_SITE_OTHER): Payer: Commercial Managed Care - PPO | Admitting: Gastroenterology

## 2017-11-06 ENCOUNTER — Other Ambulatory Visit (INDEPENDENT_AMBULATORY_CARE_PROVIDER_SITE_OTHER): Payer: Commercial Managed Care - PPO

## 2017-11-06 ENCOUNTER — Encounter: Payer: Self-pay | Admitting: Gastroenterology

## 2017-11-06 VITALS — BP 108/58 | HR 106 | Ht 69.0 in | Wt 167.0 lb

## 2017-11-06 DIAGNOSIS — K921 Melena: Secondary | ICD-10-CM

## 2017-11-06 LAB — CBC WITH DIFFERENTIAL/PLATELET
Basophils Absolute: 0 K/uL (ref 0.0–0.1)
Basophils Relative: 0.8 % (ref 0.0–3.0)
Eosinophils Absolute: 0 K/uL (ref 0.0–0.7)
Eosinophils Relative: 0 % (ref 0.0–5.0)
HCT: 24.4 % — ABNORMAL LOW (ref 39.0–52.0)
Hemoglobin: 8.4 g/dL — ABNORMAL LOW (ref 13.0–17.0)
Lymphocytes Relative: 21.2 % (ref 12.0–46.0)
Lymphs Abs: 1.2 K/uL (ref 0.7–4.0)
MCHC: 34.5 g/dL (ref 30.0–36.0)
MCV: 90.4 fl (ref 78.0–100.0)
Monocytes Absolute: 0.7 K/uL (ref 0.1–1.0)
Monocytes Relative: 12.6 % — ABNORMAL HIGH (ref 3.0–12.0)
Neutro Abs: 3.6 K/uL (ref 1.4–7.7)
Neutrophils Relative %: 65.4 % (ref 43.0–77.0)
Platelets: 280 K/uL (ref 150.0–400.0)
RBC: 2.7 Mil/uL — ABNORMAL LOW (ref 4.22–5.81)
RDW: 13.9 % (ref 11.5–15.5)
WBC: 5.5 K/uL (ref 4.0–10.5)

## 2017-11-06 NOTE — Progress Notes (Signed)
HPI: This is a very pleasant 57 year old man   who was referred to me by Lucky Cowboy, MD  to evaluate melena, anemia.    Chief complaint is melena, anemia  Wednesday black stools, solid at first and then liquid black stools for several times overnight  Prednisone PRN for back issues, two of them on Wednesday. Felt bloated PM.  Had multiple melenic stools.  Daily 325mg  ASA  He takes diclofenac daily  Felt punky on Thursday with dizziness.  Drinking extra gatorade since then.  Brown stools in the past 2 or 3 days.  He feels absolutely fine since Friday or Saturday.  No nauesea or vomiting.  3 alleve on Tuesday.  Old Data Reviewed: Labs: His hemoglobin 3 weeks ago was 12.8.  Late last week it was 10.5 and the next day it was 9.2.  BUN 56, creatinine 1.1.  Repeat one day later BUN was 23 and creatinine was 1.1.     Review of systems: Pertinent positive and negative review of systems were noted in the above HPI section. All other review negative.   Past Medical History:  Diagnosis Date  . Arthritis   . Hyperlipidemia   . Hypertension     Past Surgical History:  Procedure Laterality Date  . HERNIA REPAIR Bilateral    15 years ago  . 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 TOTAL KNEE ARTHROPLASTY Right 03/04/2014   Procedure: TOTAL KNEE ARTHROPLASTY;  Surgeon: Nestor Lewandowsky, MD;  Location: [facility] OR;  Service: Orthopedics;  Laterality: Right;  Marland Kitchen VASECTOMY      Current Outpatient Medications  Medication Sig Dispense Refill  . Cholecalciferol (VITAMIN D-3) 5000 UNITS TABS Take 15,000 Units by mouth daily. Reported on 08/11/2015    . Diclofenac Sodium CR 100 MG 24 hr tablet TAKE 1 TABLET DAILY 90 tablet 1  . lisinopril-hydrochlorothiazide (PRINZIDE,ZESTORETIC) 20-25 MG tablet TAKE 1 TABLET DAILY FOR BLOOD PRESSURE 90 tablet 1  . omeprazole  (PRILOSEC) 40 MG capsule Take 1 capsule (40 mg total) by mouth 2 (two) times daily. 60 capsule 1  . POTASSIUM PO Take 1 tablet by mouth daily.     No current facility-administered medications for this visit.     Allergies as of 11/06/2017 - Review Complete 11/06/2017  Allergen Reaction Noted  . Atorvastatin  10/15/2017    Family History  Problem Relation Age of Onset  . Breast cancer Mother   . Throat cancer Father   . Hypertension Brother   . Stroke Brother   . Hypertension Brother   . Hypertension Brother   . Colon cancer Neg Hx   . Stomach cancer Neg Hx   . Rectal cancer Neg Hx     Social History   Socioeconomic History  . Marital status: Married    Spouse name: Not on file  . Number of children: Not on file  . Years of education: Not on file  . Highest education level: Not on file  Occupational History  . Not on file  Social Needs  . Financial resource strain: Not on file  . Food insecurity:    Worry: Not on file    Inability: Not on file  . Transportation needs:    Medical: Not on file    Non-medical: Not on file  Tobacco Use  . Smoking status: Never Smoker  . Smokeless tobacco: Former Neurosurgeon  Types: Chew  Substance and Sexual Activity  . Alcohol use: Yes    Alcohol/week: 2.0 standard drinks    Types: 2 Cans of beer per week    Comment: 2-3 beer a day  . Drug use: No  . Sexual activity: Yes    Partners: Female    Birth control/protection: None  Lifestyle  . Physical activity:    Days per week: Not on file    Minutes per session: Not on file  . Stress: Not on file  Relationships  . Social connections:    Talks on phone: Not on file    Gets together: Not on file    Attends religious service: Not on file    Active member of club or organization: Not on file    Attends meetings of clubs or organizations: Not on file    Relationship status: Not on file  . Intimate partner violence:    Fear of current or ex partner: Not on file    Emotionally  abused: Not on file    Physically abused: Not on file    Forced sexual activity: Not on file  Other Topics Concern  . Not on file  Social History Narrative  . Not on file     Physical Exam: BP (!) 108/58   Pulse (!) 106   Ht 5\' 9"  (1.753 m)   Wt 167 lb (75.8 kg)   BMI 24.66 kg/m  Constitutional: generally well-appearing Psychiatric: alert and oriented x3 Eyes: extraocular movements intact Mouth: oral pharynx moist, no lesions Neck: supple no lymphadenopathy Cardiovascular: heart regular rate and rhythm Lungs: clear to auscultation bilaterally Abdomen: soft, nontender, nondistended, no obvious ascites, no peritoneal signs, normal bowel sounds Extremities: no lower extremity edema bilaterally Skin: no lesions on visible extremities   Assessment and plan: 57 y.o. male with upper GI bleed  He had pretty clear upper GI bleeding Wednesday and into Thursday.  Since then I do believe that the bleeding has completely stopped clinically.  His hemoglobin dropped a total of 3-1/2 g compared to values checked 3 weeks prior to the bleed.  He is having brown stools and he is hemodynamically stable now.  Likely this is NSAID related.  He takes Voltaren daily, full-strength aspirin daily, Aleve once in a while, and prednisone 1 or 2 times monthly for some back pains.  For now he knows to completely stop all NSAIDs.  He will continue taking the proton pump inhibitor which his primary care physician started for him late last week.  We will arrange for upper endoscopy tomorrow to check for ulcer disease, neoplasm which is doubtful. Repeat CBC today as well.    Please see the "Patient Instructions" section for addition details about the plan.   Rob Buntinganiel Tmya Wigington, MD [facility] 11/06/2017, 1:29 PM  Cc: Lucky CowboyMcKeown, William, MD

## 2017-11-06 NOTE — Patient Instructions (Addendum)
You will have labs checked today in the basement lab.  Please head down after you check out with the front desk  (cbc) You will be set up for an upper endoscopy tomorrow. Stay on twice daily omeprazole for now.  NO NSAIDs for now, including diclofenac.  Normal BMI (Body Mass Index- based on height and weight) is between 19 and 25. Your BMI today is Body mass index is 24.66 kg/m. Marland Kitchen. Please consider follow up  regarding your BMI with your Primary Care Provider.

## 2017-11-07 ENCOUNTER — Encounter: Payer: Self-pay | Admitting: Gastroenterology

## 2017-11-07 ENCOUNTER — Ambulatory Visit (AMBULATORY_SURGERY_CENTER): Payer: Commercial Managed Care - PPO | Admitting: Gastroenterology

## 2017-11-07 VITALS — BP 131/81 | HR 81 | Temp 99.5°F | Resp 20 | Ht 69.0 in | Wt 167.0 lb

## 2017-11-07 DIAGNOSIS — K921 Melena: Secondary | ICD-10-CM | POA: Diagnosis not present

## 2017-11-07 DIAGNOSIS — K259 Gastric ulcer, unspecified as acute or chronic, without hemorrhage or perforation: Secondary | ICD-10-CM | POA: Diagnosis not present

## 2017-11-07 DIAGNOSIS — K3189 Other diseases of stomach and duodenum: Secondary | ICD-10-CM

## 2017-11-07 DIAGNOSIS — I10 Essential (primary) hypertension: Secondary | ICD-10-CM | POA: Diagnosis not present

## 2017-11-07 MED ORDER — SODIUM CHLORIDE 0.9 % IV SOLN: 500.0000 mL | Freq: Once | INTRAVENOUS | Status: DC

## 2017-11-07 NOTE — Patient Instructions (Signed)
YOU HAD AN ENDOSCOPIC PROCEDURE TODAY AT THE [facility]:   Refer to the procedure report that was given to you for any specific questions about what was found during the examination.  If the procedure report does not answer your questions, please call your gastroenterologist to clarify.  If you requested that your care partner not be given the details of your procedure findings, then the procedure report has been included in a sealed envelope for you to review at your convenience later.  YOU SHOULD EXPECT: Some feelings of bloating in the abdomen. Passage of more gas than usual.  Walking can help get rid of the air that was put into your GI tract during the procedure and reduce the bloating. If you had a lower endoscopy (such as a colonoscopy or flexible sigmoidoscopy) you may notice spotting of blood in your stool or on the toilet paper. If you underwent a bowel prep for your procedure, you may not have a normal bowel movement for a few days.  Please Note:  You might notice some irritation and congestion in your nose or some drainage.  This is from the oxygen used during your procedure.  There is no need for concern and it should clear up in a day or so.  SYMPTOMS TO REPORT IMMEDIATELY:   Following upper endoscopy (EGD)  Vomiting of blood or coffee ground material  New chest pain or pain under the shoulder blades  Painful or persistently difficult swallowing  New shortness of breath  Fever of 100F or higher  Black, tarry-looking stools  For urgent or emergent issues, a gastroenterologist can be reached at any hour by calling [phone].   DIET:  We do recommend a small meal at first, but then you may proceed to your regular diet.  Drink plenty of fluids but you should avoid alcoholic beverages for 24 hours.  ACTIVITY:  You should plan to take it easy for the rest of today and you should NOT DRIVE or use heavy machinery until tomorrow (because of the sedation medicines used  during the test).    FOLLOW UP: Our staff will call the number listed on your records the next business day following your procedure to check on you and address any questions or concerns that you may have regarding the information given to you following your procedure. If we do not reach you, we will leave a message.  However, if you are feeling well and you are not experiencing any problems, there is no need to return our call.  We will assume that you have returned to your regular daily activities without incident.  If any biopsies were taken you will be contacted by phone or by letter within the next 1-3 weeks.  Please call us at [phone] if you have not heard about the biopsies in 3 weeks.   Await for biopsy results Continue to avoid NSAIDS, aspirin for now, try tylenol only for pain Please start over the counter Iron supplement pill once a day (ferrous sulfate 325 mg.This may change the color of the stools a bit darker. Office will call you to schedule repeat EGD in two months to confirm ulcer healing Stay on omeprazole twice daily until then   SIGNATURES/CONFIDENTIALITY: You and/or your care partner have signed paperwork which will be entered into your electronic medical record.  These signatures attest to the fact that that the information above on your After Visit Summary has been reviewed and is understood.  Full responsibility of  the confidentiality of this discharge information lies with you and/or your care-partner. 

## 2017-11-07 NOTE — Op Note (Signed)
[facility] Patient Name: Jermaine Stewart Procedure Date: 11/07/2017 3:10 PM MRN: [id number] Endoscopist: Rachael Fee , MD Age: 57 Referring MD:  Date of Birth: 1961/01/19 Gender: Male Account #: [id number] Procedure:                Upper GI endoscopy Indications:              Acute post hemorrhagic anemia, Melena Medicines:                Monitored Anesthesia Care Procedure:                Pre-Anesthesia Assessment:                           - Prior to the procedure, a History and Physical                            was performed, and patient medications and                            allergies were reviewed. The patient's tolerance of                            previous anesthesia was also reviewed. The risks                            and benefits of the procedure and the sedation                            options and risks were discussed with the patient.                            All questions were answered, and informed consent                            was obtained. Prior Anticoagulants: The patient has                            taken no previous anticoagulant or antiplatelet                            agents. ASA Grade Assessment: II - A patient with                            mild systemic disease. After reviewing the risks                            and benefits, the patient was deemed in                            satisfactory condition to undergo the procedure.                           After obtaining informed consent, the endoscope was  passed under direct vision. Throughout the                            procedure, the patient's blood pressure, pulse, and                            oxygen saturations were monitored continuously. The                            Endoscope was introduced through the mouth, and                            advanced to the second part of duodenum. The upper                            GI endoscopy was  accomplished without difficulty.                            The patient tolerated the procedure well. Scope In: Scope Out: Findings:                 Two non-bleeding, benign appearing, clean based                            gastric ulcers with no stigmata of recent bleeding                            were found in the gastric antrum. The largest                            lesion was 5 mm in largest dimension. Biopsies were                            taken with a cold forceps for histology.                           The exam was otherwise without abnormality. Complications:            No immediate complications. Estimated blood loss:                            None. Estimated Blood Loss:     Estimated blood loss: none. Impression:               - Clean based gastric ulcers (at low risk for                            rebleeding). These are almost certainly related to                            NSAIDs, recent prednisone.                           - The examination was otherwise normal. Recommendation:           - Patient has  a contact number available for                            emergencies. The signs and symptoms of potential                            delayed complications were discussed with the                            patient. Return to normal activities tomorrow.                            Written discharge instructions were provided to the                            patient.                           - Resume previous diet.                           - Await pathology results. If biopsies show H.                            pylori infection you will be started on appropriate                            antibiotics.                           - continue to avoid NSAIDs, aspirin for now. Try                            tylenol only for pain.                           - please start one OTC iron supplement pill once                            daily (ferrous sulfate 325mg ). This may  change the                            color, consistency of your stools a bit (darker).                           - Dr. Christella Hartigan' office will call to arrange repeat                            EGD in 2 months to confirm ulcer healing. Stay on                            twice daily omeprazole until then. Rachael Fee, MD 11/07/2017 3:26:40 PM This report has been signed electronically.

## 2017-11-07 NOTE — Progress Notes (Signed)
Called to room to assist during endoscopic procedure.  Patient ID and intended procedure confirmed with present staff. Received instructions for my participation in the procedure from the performing physician.  

## 2017-11-07 NOTE — Progress Notes (Signed)
Report given to PACU, vss 

## 2017-11-08 ENCOUNTER — Telehealth: Payer: Self-pay

## 2017-11-08 NOTE — Telephone Encounter (Signed)
  Follow up Call-  Call back number 11/07/2017  Post procedure Call Back phone  # [phone]  Permission to leave phone message Yes  Some recent data might be hidden     Patient questions:  Do you have a fever, pain , or abdominal swelling? No. Pain Score  0 *  Have you tolerated food without any problems? Yes.    Have you been able to return to your normal activities? Yes.    Do you have any questions about your discharge instructions: Diet   No. Medications  No. Follow up visit  No.  Do you have questions or concerns about your Care? No.  Actions: * If pain score is 4 or above: No action needed, pain <4.  No problems noted per pt. maw

## 2017-11-29 ENCOUNTER — Ambulatory Visit (INDEPENDENT_AMBULATORY_CARE_PROVIDER_SITE_OTHER): Payer: Commercial Managed Care - PPO

## 2017-11-29 DIAGNOSIS — Z23 Encounter for immunization: Secondary | ICD-10-CM | POA: Diagnosis not present

## 2017-11-29 NOTE — Progress Notes (Signed)
Pt presents for FLU VACCINE. Which was given in right arm w/o concerns

## 2017-12-04 DIAGNOSIS — M792 Neuralgia and neuritis, unspecified: Secondary | ICD-10-CM | POA: Diagnosis not present

## 2017-12-24 ENCOUNTER — Encounter: Payer: Self-pay | Admitting: Internal Medicine

## 2017-12-24 ENCOUNTER — Ambulatory Visit (INDEPENDENT_AMBULATORY_CARE_PROVIDER_SITE_OTHER): Payer: Commercial Managed Care - PPO | Admitting: Internal Medicine

## 2017-12-24 VITALS — BP 132/78 | HR 92 | Temp 97.5°F | Resp 16 | Ht 69.0 in | Wt 172.6 lb

## 2017-12-24 DIAGNOSIS — S60152S Contusion of left little finger with damage to nail, sequela: Secondary | ICD-10-CM | POA: Diagnosis not present

## 2017-12-24 DIAGNOSIS — L03012 Cellulitis of left finger: Secondary | ICD-10-CM

## 2017-12-24 MED ORDER — HYDROCODONE-ACETAMINOPHEN 5-325 MG PO TABS: ORAL_TABLET | ORAL | 0 refills | Status: DC

## 2017-12-24 MED ORDER — CEPHALEXIN 500 MG PO CAPS: ORAL_CAPSULE | ORAL | 0 refills | Status: DC

## 2017-12-24 NOTE — Progress Notes (Signed)
Subjective:    Patient ID: Jermaine Stewart, male    DOB: 08/28/1960, 57 y.o.   MRN: [id number]  HPI   Patient presents 6 weeks post crush injury of the Lt 5th finger with alleged fx of the mid or distal phalanges (No records available). Patient has had increasing pain of the nail interrupting sleep.   Outpatient Medications Prior to Visit  Medication Sig Dispense Refill  . Cholecalciferol (VITAMIN D-3) 5000 UNITS TABS Take 15,000 Units by mouth daily. Reported on 08/11/2015    . Diclofenac Sodium CR 100 MG 24 hr tablet TAKE 1 TABLET DAILY 90 tablet 1  . lisinopril-hydrochlorothiazide (PRINZIDE,ZESTORETIC) 20-25 MG tablet TAKE 1 TABLET DAILY FOR BLOOD PRESSURE 90 tablet 1  . omeprazole (PRILOSEC) 40 MG capsule Take 1 capsule (40 mg total) by mouth 2 (two) times daily. 60 capsule 1  . POTASSIUM PO Take 1 tablet by mouth daily.     Facility-Administered Medications Prior to Visit  Medication Dose Route Frequency Provider Last Rate Last Dose  . 0.9 %  sodium chloride infusion  500 mL Intravenous Once Rachael Fee, MD       Allergies  Allergen Reactions  . Atorvastatin     Arthralgias   Past Medical History:  Diagnosis Date  . Arthritis   . Hyperlipidemia   . Hypertension    Past Surgical History:  Procedure Laterality Date  . HERNIA REPAIR Bilateral    15 years ago  . 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 TOTAL KNEE ARTHROPLASTY Right 03/04/2014   Procedure: TOTAL KNEE ARTHROPLASTY;  Surgeon: Nestor Lewandowsky, MD;  Location: [facility] OR;  Service: Orthopedics;  Laterality: Right;  Marland Kitchen VASECTOMY     Review of Systems    10 point systems review negative except as above.    Objective:   Physical Exam  BP 132/78   Pulse 92   Temp (!) 97.5 F (36.4 C)   Resp 16   Ht 5\' 9"  (1.753 m)   Wt 172 lb 9.6 oz (78.3 kg)   BMI 25.49 kg/m   HEENT - WNL. Neck  - supple.  Chest - Clear. Cor - Nl HS. RRR w/o sig m. MS- FROM w/o deformities.  Gait Nl. Distal Lt finger warm, red and swollen & very tender to pressure or manipulation. Nail appears opaque. Neuro -  Nl w/o focal abnormalities.  Procedure (CPT - 10060)    After informed consent and aseptic prep with alcohol the distal digit was anesthetized with 1 ml of Marcaine 0.5%. Then the nail was centrally I & D'd with a 25 G trochar followed by a 25 G trochar. Next the incision was widened with a #11 scalpel to a diameter of 3-4 mm allowing expressing of putrid material. The the anterior nail was slightly elevated allowing sharp debridement of necrotic detritus. Then the wound was quenched with hydrogen peroxide x 2 and then rinsed with betadine solution. Sterile bulk dressing was applied along with  long & a fingertip aluminum splints. Patient was instructed in post-op care.      Assessment & Plan:   1. Contusion of left little finger with damage to nail, sequela  - HYDROcodone-acetaminophen (NORCO) 5-325 MG tablet; Take 1/2 to 1 tablet 4 hours ONLY if needed for pain  Dispense: 30 tablet; Refill: 0  2. Pyonychia of finger of left hand  -  cephALEXin (KEFLEX) 500 MG capsule; Take 1 capsule 4 x /day after meals & bedtime w/food for infection  Dispense: 40 capsule; Refill: 0

## 2017-12-24 NOTE — Patient Instructions (Signed)
Incision and Drainage Incision and drainage is a surgical procedure to open and drain a fluid-filled sac. The sac may be filled with pus, mucus, or blood. Examples of fluid-filled sacs that may need surgical drainage include cysts, skin infections (abscesses), and red lumps that develop from a ruptured cyst or a small abscess (boils). You may need this procedure if the affected area is large, painful, infected, or not healing well. Tell a health care provider about:  Any allergies you have.  All medicines you are taking, including vitamins, herbs, eye drops, creams, and over-the-counter medicines.  Any problems you or family members have had with anesthetic medicines.  Any blood disorders you have.  Any surgeries you have had.  Any medical conditions you have.  Whether you are pregnant or may be pregnant. What are the risks? Generally, this is a safe procedure. However, problems may occur, including:  Infection.  Bleeding.  Allergic reactions to medicines.  Scarring.  What happens before the procedure?  You may need an ultrasound or other imaging tests to see how large or deep the fluid-filled sac is.  You may have blood tests to check for infection.  You may get a tetanus shot.  You may be given antibiotic medicine to help prevent infection.  Follow instructions from your health care provider about eating or drinking restrictions.  Ask your health care provider about: ? Changing or stopping your regular medicines. This is especially important if you are taking diabetes medicines or blood thinners. ? Taking medicines such as aspirin and ibuprofen. These medicines can thin your blood. Do not take these medicines before your procedure if your health care provider instructs you not to.  Plan to have someone take you home after the procedure.  If you will be going home right after the procedure, plan to have someone stay with you for 24 hours. What happens during the  procedure?  To reduce your risk of infection: ? Your health care team will wash or sanitize their hands. ? Your skin will be washed with soap.  You will be given one or more of the following: ? A medicine to help you relax (sedative). ? A medicine to numb the area (local anesthetic). ? A medicine to make you fall asleep (general anesthetic).  An incision will be made in the top of the fluid-filled sac.  The contents of the sac may be squeezed out, or a syringe or tube (catheter)may be used to empty the sac.  The catheter may be left in place for several weeks to drain any fluid. Or, your health care provider may stitch open the edges of the incision to make a long-term opening for drainage (marsupialization).  The inside of the sac may be washed out (irrigated) with a sterile solution and packed with gauze before it is covered with a bandage (dressing). The procedure may vary among health care providers and hospitals. What happens after the procedure?  Your blood pressure, heart rate, breathing rate, and blood oxygen level will be monitored often until the medicines you were given have worn off.  Do not drive for 24 hours if you received a sedative. This information is not intended to replace advice given to you by your health care provider. Make sure you discuss any questions you have with your health care provider. Document Released: 08/02/2000 Document Revised: 07/15/2015 Document Reviewed: 11/27/2014 Elsevier Interactive Patient Education  2018 [employer]  

## 2017-12-26 ENCOUNTER — Other Ambulatory Visit: Payer: Self-pay | Admitting: Physician Assistant

## 2018-01-04 ENCOUNTER — Other Ambulatory Visit: Payer: Self-pay | Admitting: Internal Medicine

## 2018-01-15 ENCOUNTER — Ambulatory Visit (INDEPENDENT_AMBULATORY_CARE_PROVIDER_SITE_OTHER): Payer: Commercial Managed Care - PPO | Admitting: Internal Medicine

## 2018-01-15 ENCOUNTER — Encounter: Payer: Self-pay | Admitting: Internal Medicine

## 2018-01-15 VITALS — BP 134/70 | HR 108 | Temp 97.5°F | Resp 16 | Ht 69.0 in | Wt 174.2 lb

## 2018-01-15 DIAGNOSIS — I10 Essential (primary) hypertension: Secondary | ICD-10-CM

## 2018-01-15 DIAGNOSIS — R7303 Prediabetes: Secondary | ICD-10-CM | POA: Diagnosis not present

## 2018-01-15 DIAGNOSIS — K219 Gastro-esophageal reflux disease without esophagitis: Secondary | ICD-10-CM

## 2018-01-15 DIAGNOSIS — E559 Vitamin D deficiency, unspecified: Secondary | ICD-10-CM

## 2018-01-15 DIAGNOSIS — Z79899 Other long term (current) drug therapy: Secondary | ICD-10-CM

## 2018-01-15 DIAGNOSIS — E782 Mixed hyperlipidemia: Secondary | ICD-10-CM

## 2018-01-15 NOTE — Progress Notes (Signed)
134

## 2018-01-15 NOTE — Patient Instructions (Signed)

## 2018-01-16 ENCOUNTER — Encounter: Payer: Self-pay | Admitting: Internal Medicine

## 2018-01-16 ENCOUNTER — Other Ambulatory Visit: Payer: Self-pay | Admitting: Internal Medicine

## 2018-01-16 LAB — COMPLETE METABOLIC PANEL WITH GFR
AG RATIO: 1.7 (calc) (ref 1.0–2.5)
ALBUMIN MSPROF: 4.5 g/dL (ref 3.6–5.1)
ALKALINE PHOSPHATASE (APISO): 67 U/L (ref 40–115)
ALT: 22 U/L (ref 9–46)
AST: 21 U/L (ref 10–35)
BUN: 10 mg/dL (ref 7–25)
CO2: 28 mmol/L (ref 20–32)
CREATININE: 1.17 mg/dL (ref 0.70–1.33)
Calcium: 9.6 mg/dL (ref 8.6–10.3)
Chloride: 94 mmol/L — ABNORMAL LOW (ref 98–110)
GFR, Est African American: 81 mL/min/{1.73_m2} (ref >=60)
GFR, Est Non African American: 70 mL/min/{1.73_m2} (ref >=60)
GLOBULIN: 2.7 g/dL (ref 1.9–3.7)
Glucose, Bld: 79 mg/dL (ref 65–99)
POTASSIUM: 4 mmol/L (ref 3.5–5.3)
SODIUM: 133 mmol/L — AB (ref 135–146)
Total Bilirubin: 0.6 mg/dL (ref 0.2–1.2)
Total Protein: 7.2 g/dL (ref 6.1–8.1)

## 2018-01-16 LAB — CBC WITH DIFFERENTIAL/PLATELET
BASOS ABS: 37 {cells}/uL (ref 0–200)
Basophils Relative: 0.5 %
EOS ABS: 244 {cells}/uL (ref 15–500)
Eosinophils Relative: 3.3 %
HCT: 29.2 % — ABNORMAL LOW (ref 38.5–50.0)
HEMOGLOBIN: 9.2 g/dL — AB (ref 13.2–17.1)
Lymphs Abs: 1066 cells/uL (ref 850–3900)
MCH: 22.7 pg — AB (ref 27.0–33.0)
MCHC: 31.5 g/dL — AB (ref 32.0–36.0)
MCV: 72.1 fL — ABNORMAL LOW (ref 80.0–100.0)
MONOS PCT: 10.1 %
MPV: 10 fL (ref 7.5–12.5)
NEUTROS ABS: 5306 {cells}/uL (ref 1500–7800)
Neutrophils Relative %: 71.7 %
Platelets: 273 10*3/uL (ref 140–400)
RBC: 4.05 10*6/uL — ABNORMAL LOW (ref 4.20–5.80)
RDW: 16.8 % — ABNORMAL HIGH (ref 11.0–15.0)
TOTAL LYMPHOCYTE: 14.4 %
WBC mixed population: 747 cells/uL (ref 200–950)
WBC: 7.4 10*3/uL (ref 3.8–10.8)

## 2018-01-16 LAB — HEMOGLOBIN A1C
HEMOGLOBIN A1C: 5.4 %{Hb} (ref <5.7)
MEAN PLASMA GLUCOSE: 108 (calc)
eAG (mmol/L): 6 (calc)

## 2018-01-16 LAB — LIPID PANEL
CHOL/HDL RATIO: 3.9 (calc) (ref <5.0)
Cholesterol: 247 mg/dL — ABNORMAL HIGH (ref <200)
HDL: 63 mg/dL (ref >40)
LDL CHOLESTEROL (CALC): 146 mg/dL — AB
Non-HDL Cholesterol (Calc): 184 mg/dL (calc) — ABNORMAL HIGH (ref <130)
Triglycerides: 235 mg/dL — ABNORMAL HIGH (ref <150)

## 2018-01-16 LAB — INSULIN, RANDOM: Insulin: 3.5 u[IU]/mL (ref 2.0–19.6)

## 2018-01-16 LAB — TSH: TSH: 0.92 mIU/L (ref 0.40–4.50)

## 2018-01-16 LAB — VITAMIN D 25 HYDROXY (VIT D DEFICIENCY, FRACTURES): VIT D 25 HYDROXY: 41 ng/mL (ref 30–100)

## 2018-01-16 LAB — MAGNESIUM: Magnesium: 2 mg/dL (ref 1.5–2.5)

## 2018-01-16 NOTE — Progress Notes (Signed)
This very nice 57 y.o.male presents for 3 month follow up with HTN, HLD, Pre-Diabetes and Vitamin D Deficiency.      Patient is treated for HTN (2000) & BP has been controlled at home. Today's BP is at goal - 134/70. Patient has had no complaints of any cardiac type chest pain, palpitations, dyspnea / orthopnea / PND, dizziness, claudication, or dependent edema.     Hyperlipidemia is controlled with diet & meds. Patient denies myalgias or other med SE's. Last Lipids were at goal: Lab Results  Component Value Date   CHOL 173 10/15/2017   HDL 83 10/15/2017   LDLCALC 72 10/15/2017   TRIG 96 10/15/2017   CHOLHDL 2.1 10/15/2017      Also, the patient is monitored proactively for  PreDiabetes and has had no symptoms of reactive hypoglycemia, diabetic polys, paresthesias or visual blurring.  Last A1c was Normal & at goal: Lab Results  Component Value Date   HGBA1C 5.0 06/26/2017      Further, the patient also has history of Vitamin D Deficiency ("25"/2014)  and supplements vitamin D without any suspected side-effects. Last vitamin D was at goal:  Lab Results  Component Value Date   VD25OH 68 06/26/2017   Current Outpatient Medications on File Prior to Visit  Medication Sig  . Cholecalciferol (VITAMIN D-3) 5000 UNITS TABS Take 15,000 Units by mouth daily. Reported on 08/11/2015  . Diclofenac Sodium CR 100 MG 24 hr tablet TAKE 1 TABLET DAILY  . lisinopril-hydrochlorothiazide (PRINZIDE,ZESTORETIC) 20-25 MG tablet TAKE 1 TABLET DAILY FOR BLOOD PRESSURE  . omeprazole (PRILOSEC) 40 MG capsule TAKE 1 CAPSULE BY MOUTH TWICE DAILY  . POTASSIUM PO Take 1 tablet by mouth daily.  . predniSONE (DELTASONE) 20 MG tablet TAKE 1/2 TO 1 TABLET BY MOUTH TWICE DAILY OR THREE TIMES DAILY AS DIRECTED   No current facility-administered medications on file prior to visit.    Allergies  Allergen Reactions  . Atorvastatin     Arthralgias   PMHx:   Past Medical History:  Diagnosis Date  . Arthritis     . Hyperlipidemia   . Hypertension    Immunization History  Administered Date(s) Administered  . Influenza Inj Mdck Quad With Preservative 11/29/2017  . Influenza,inj,quad, With Preservative 12/21/2015  . Influenza-Unspecified 11/01/2016  . PPD Test 07/22/2013, 04/24/2016, 06/26/2017  . Tdap 07/22/2013   Past Surgical History:  Procedure Laterality Date  . HERNIA REPAIR Bilateral    15 years ago  . 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 TOTAL KNEE ARTHROPLASTY Right 03/04/2014   Procedure: TOTAL KNEE ARTHROPLASTY;  Surgeon: Nestor Lewandowsky, MD;  Location: [facility] OR;  Service: Orthopedics;  Laterality: Right;  Marland Kitchen VASECTOMY     FHx:    Reviewed / unchanged  SHx:    Reviewed / unchanged   Systems Review:  Constitutional: Denies fever, chills, wt changes, headaches, insomnia, fatigue, night sweats, change in appetite. Eyes: Denies redness, blurred vision, diplopia, discharge, itchy, watery eyes.  ENT: Denies discharge, congestion, post nasal drip, epistaxis, sore throat, earache, hearing loss, dental pain, tinnitus, vertigo, sinus pain, snoring.  CV: Denies chest pain, palpitations, irregular heartbeat, syncope, dyspnea, diaphoresis, orthopnea, PND, claudication or edema. Respiratory: denies cough, dyspnea, DOE, pleurisy, hoarseness, laryngitis, wheezing.  Gastrointestinal: Denies dysphagia, odynophagia, heartburn, reflux, water brash, abdominal pain or cramps, nausea, vomiting, bloating, diarrhea, constipation,  hematemesis, melena, hematochezia  or hemorrhoids. Genitourinary: Denies dysuria, frequency, urgency, nocturia, hesitancy, discharge, hematuria or flank pain. Musculoskeletal: Denies arthralgias, myalgias, stiffness, jt. swelling, pain, limping or strain/sprain.  Skin: Denies pruritus, rash, hives, warts, acne, eczema or change in skin lesion(s). Neuro:  No weakness, tremor, incoordination, spasms, paresthesia or pain. Psychiatric: Denies confusion, memory loss or sensory loss. Endo: Denies change in weight, skin or hair change.  Heme/Lymph: No excessive bleeding, bruising or enlarged lymph nodes.  Physical Exam  BP 134/70   Pulse (!) 108   Temp (!) 97.5 F (36.4 C)   Resp 16   Ht 5\' 9"  (1.753 m)   Wt 174 lb 3.2 oz (79 kg)   BMI 25.72 kg/m   Appears  well nourished, well groomed  and in no distress.  Eyes: PERRLA, EOMs, conjunctiva no swelling or erythema. Sinuses: No frontal/maxillary tenderness ENT/Mouth: EAC's clear, TM's nl w/o erythema, bulging. Nares clear w/o erythema, swelling, exudates. Oropharynx clear without erythema or exudates. Oral hygiene is good. Tongue normal, non obstructing. Hearing intact.  Neck: Supple. Thyroid not palpable. Car 2+/2+ without bruits, nodes or JVD. Chest: Respirations nl with BS clear & equal w/o rales, rhonchi, wheezing or stridor.  Cor: Heart sounds normal w/ regular rate and rhythm without sig. murmurs, gallops, clicks or rubs. Peripheral pulses normal and equal  without edema.  Abdomen: Soft & bowel sounds normal. Non-tender w/o guarding, rebound, hernias, masses or organomegaly.  Lymphatics: Unremarkable.  Musculoskeletal: Full ROM all peripheral extremities, joint stability, 5/5 strength and normal gait.  Skin: Warm, dry without exposed rashes, lesions or ecchymosis apparent.  Neuro: Cranial nerves intact, reflexes equal bilaterally. Sensory-motor testing grossly intact. Tendon reflexes grossly intact.  Pysch: Alert & oriented x 3.  Insight and judgement nl & appropriate. No ideations.  Assessment and Plan:  1. Essential hypertension  - Continue medication, monitor blood pressure at home.  - Continue DASH diet.  Reminder to go to the ER if any CP,  SOB, nausea, dizziness, severe HA, changes vision/speech.  - CBC with Differential/Platelet - COMPLETE METABOLIC PANEL WITH GFR -  Magnesium - TSH  2. Hyperlipidemia, mixed  - Continue diet/meds, exercise,& lifestyle modifications.  - Continue monitor periodic cholesterol/liver & renal functions   - Lipid panel - TSH  3. Prediabetes  - Continue diet, exercise,  - lifestyle modifications.  - Monitor appropriate labs.  - Hemoglobin A1c - Insulin, random  4. Vitamin D deficiency  - Continue supplementation.  - VITAMIN D 25 Hydroxyl  5. Gastroesophageal reflux disease  - CBC with Differential/Platelet  6. Medication management  - CBC with Differential/Platelet - COMPLETE METABOLIC PANEL WITH GFR - Magnesium - Lipid panel - TSH - Hemoglobin A1c - Insulin, random - VITAMIN D 25 Hydroxyl       Discussed  regular exercise, BP monitoring, weight control to achieve/maintain BMI less than 25 and discussed med and SE's. Recommended labs to assess and monitor clinical status with further disposition pending results of labs. Over 30 minutes of exam, counseling, chart review was performed.

## 2018-01-25 ENCOUNTER — Other Ambulatory Visit: Payer: Self-pay | Admitting: Physician Assistant

## 2018-02-07 ENCOUNTER — Encounter: Payer: Self-pay | Admitting: Gastroenterology

## 2018-02-11 DIAGNOSIS — M5416 Radiculopathy, lumbar region: Secondary | ICD-10-CM | POA: Diagnosis not present

## 2018-02-18 ENCOUNTER — Encounter: Payer: Self-pay | Admitting: Internal Medicine

## 2018-02-18 ENCOUNTER — Ambulatory Visit (INDEPENDENT_AMBULATORY_CARE_PROVIDER_SITE_OTHER): Payer: Commercial Managed Care - PPO | Admitting: Internal Medicine

## 2018-02-18 VITALS — BP 126/72 | HR 88 | Temp 97.3°F | Resp 16 | Ht 69.0 in | Wt 174.8 lb

## 2018-02-18 DIAGNOSIS — K219 Gastro-esophageal reflux disease without esophagitis: Secondary | ICD-10-CM

## 2018-02-18 DIAGNOSIS — D649 Anemia, unspecified: Secondary | ICD-10-CM

## 2018-02-18 DIAGNOSIS — I10 Essential (primary) hypertension: Secondary | ICD-10-CM | POA: Diagnosis not present

## 2018-02-18 DIAGNOSIS — M5442 Lumbago with sciatica, left side: Secondary | ICD-10-CM

## 2018-02-18 DIAGNOSIS — Z79899 Other long term (current) drug therapy: Secondary | ICD-10-CM

## 2018-02-18 DIAGNOSIS — G8929 Other chronic pain: Secondary | ICD-10-CM

## 2018-02-18 MED ORDER — GABAPENTIN 600 MG PO TABS: ORAL_TABLET | ORAL | 1 refills | Status: DC

## 2018-02-18 NOTE — Progress Notes (Signed)
   Subjective:    Patient ID: Jermaine Stewart, male    DOB: [birth year], 57 y.o.   MRN: [id number]  HPI    This nice 57 yo MWM returns for 1 month f/u of worsening anemia suspected iron deficiengy and attributed to NSAID usage for BACK pain . Patient is followed by Dr Yevette Edwardsumonski and is scheduled for a L4/L5 diskectomy & Fusion on Feb 6. Patient describes Lt sciatica pains he feels are disabling and frequently awaken him or preclude sleep.     His hgb's have drifted down from 12-13 gm% to last 9.2% 1 month ago. He's been on Omeprazole and iron supplements and he apparently misunderstood and did not stop his Diclofenac. He denies any bright red rectal bleeding or melanotic stools. He denies current heartburn or GI sx's . Likewise he's not had respiratory or cardiac sx's.   Medication Sig  . VITAMIN D 5000 UNITS Take 15,000 Units by mouth daily. Reported on 08/11/2015  . Diclofenac Sodium CR 100 MG 24 hr TAKE 1 TABLET DAILY  . lisinopril-hctz 20-25 MG  TAKE 1 TABLET DAILY FOR BLOOD PRESSURE  . omeprazole 40 MG  TAKE 1 CAPSULE BY MOUTH TWICE DAILY  . POTASSIUM  Take 1 tablet by mouth daily.   Allergies  Allergen Reactions  . Atorvastatin     Arthralgias   Past Medical History:  Diagnosis Date  . Arthritis   . Hyperlipidemia   . Hypertension    Review of Systems    10 point systems review negative except as above.    Objective:   Physical Exam  BP 126/72   Pulse 88   Temp (!) 97.3 F (36.3 C)   Resp 16   Ht 5\' 9"  (1.753 m)   Wt 174 lb 12.8 oz (79.3 kg)   BMI 25.81 kg/m   HEENT - WNL. Neck - supple.  Chest - Clear equal BS. Cor - Nl HS. RRR w/o sig MGR. PP 1(+). No edema. MS- FROM w/o deformities.  Gait Nl. (+) SLR - leftNeuro -  Nl w/o focal abnormalities.    Assessment & Plan:   1. Essential hypertension  2. Gastroesophageal reflux disease   3. Anemia, microcytic  - CBC with Differential/Platelet - Iron,Total/Total Iron Binding Cap - Vitamin B12 - Ferritin -  Folate RBC - Reticulocytes  4. Medication management  - CBC with Differential/Platelet - Iron,Total/Total Iron Binding Cap - Vitamin B12 - Ferritin - Folate RBC - Reticulocytes  5. Chronic left-sided low back pain with left-sided sciatica  - gabapentin (NEURONTIN) 600 MG tablet; Take 1/2 to 1 tablet 3 to 4 x /day as needed for pain  Dispense: 360 tablet; Refill: 1

## 2018-02-19 LAB — CBC WITH DIFFERENTIAL/PLATELET
Absolute Monocytes: 691 cells/uL (ref 200–950)
Basophils Absolute: 49 cells/uL (ref 0–200)
Basophils Relative: 1 %
Eosinophils Absolute: 29 cells/uL (ref 15–500)
Eosinophils Relative: 0.6 %
HCT: 36.5 % — ABNORMAL LOW (ref 38.5–50.0)
Hemoglobin: 11.6 g/dL — ABNORMAL LOW (ref 13.2–17.1)
Lymphs Abs: 985 cells/uL (ref 850–3900)
MCH: 24.3 pg — ABNORMAL LOW (ref 27.0–33.0)
MCHC: 31.8 g/dL — ABNORMAL LOW (ref 32.0–36.0)
MCV: 76.4 fL — ABNORMAL LOW (ref 80.0–100.0)
MPV: 9.6 fL (ref 7.5–12.5)
Monocytes Relative: 14.1 %
Neutro Abs: 3146 cells/uL (ref 1500–7800)
Neutrophils Relative %: 64.2 %
PLATELETS: 357 10*3/uL (ref 140–400)
RBC: 4.78 10*6/uL (ref 4.20–5.80)
RDW: 21.2 % — ABNORMAL HIGH (ref 11.0–15.0)
TOTAL LYMPHOCYTE: 20.1 %
WBC: 4.9 10*3/uL (ref 3.8–10.8)

## 2018-02-19 LAB — FERRITIN: Ferritin: 64 ng/mL (ref 38–380)

## 2018-02-19 LAB — IRON, TOTAL/TOTAL IRON BINDING CAP
%SAT: 20 % (calc) (ref 20–48)
IRON: 94 ug/dL (ref 50–180)
TIBC: 468 mcg/dL (calc) — ABNORMAL HIGH (ref 250–425)

## 2018-02-19 LAB — VITAMIN B12: Vitamin B-12: 405 pg/mL (ref 200–1100)

## 2018-02-19 LAB — RETICULOCYTES
ABS Retic: 71700 cells/uL (ref 25000–9000)
Retic Ct Pct: 1.5 %

## 2018-02-19 LAB — FOLATE RBC: RBC Folate: 811 ng/mL RBC (ref >280)

## 2018-02-28 ENCOUNTER — Other Ambulatory Visit: Payer: Self-pay | Admitting: Internal Medicine

## 2018-03-03 ENCOUNTER — Other Ambulatory Visit: Payer: Self-pay | Admitting: Internal Medicine

## 2018-03-10 ENCOUNTER — Other Ambulatory Visit: Payer: Self-pay | Admitting: Internal Medicine

## 2018-03-13 ENCOUNTER — Other Ambulatory Visit: Payer: Self-pay | Admitting: Internal Medicine

## 2018-03-13 DIAGNOSIS — I10 Essential (primary) hypertension: Secondary | ICD-10-CM

## 2018-03-18 ENCOUNTER — Other Ambulatory Visit: Payer: Self-pay | Admitting: Orthopedic Surgery

## 2018-03-20 ENCOUNTER — Ambulatory Visit (INDEPENDENT_AMBULATORY_CARE_PROVIDER_SITE_OTHER): Payer: Commercial Managed Care - PPO | Admitting: Physician Assistant

## 2018-03-20 ENCOUNTER — Encounter: Payer: Self-pay | Admitting: Physician Assistant

## 2018-03-20 VITALS — BP 130/70 | HR 104 | Temp 98.0°F | Resp 14 | Wt 176.8 lb

## 2018-03-20 DIAGNOSIS — J0100 Acute maxillary sinusitis, unspecified: Secondary | ICD-10-CM | POA: Diagnosis not present

## 2018-03-20 MED ORDER — FLUTICASONE PROPIONATE 50 MCG/ACT NA SUSP: 2.0000 | Freq: Every day | NASAL | 1 refills | Status: DC

## 2018-03-20 MED ORDER — AZITHROMYCIN 250 MG PO TABS: ORAL_TABLET | ORAL | 1 refills | Status: DC

## 2018-03-20 NOTE — Progress Notes (Signed)
Subjective:    Patient ID: Jermaine Stewart, male    DOB: 1960-05-03, 58 y.o.   MRN: [id number]  HPI 58 y.o. WM presents with sinus/cold x Nov.  He is having a back surgery with Dr. Yevette Edwards next month and is concerned.  He has taken prednisone 60 mg over 3 days, 2 weeks ago. Has not had any ABX. Has tried [employer] adviser but has not helped.  He has cough with colored mucus, nasal congestion/rhinorrhea, no SOB, CP, sinus HA/teeth pain, fever, chills.  Last CXR 2018  Temperature 98 F (36.7 C), temperature source Temporal, resp. rate 14, weight 176 lb 12.8 oz (80.2 kg).  Medications Current Outpatient Medications on File Prior to Visit  Medication Sig  . Cholecalciferol (VITAMIN D-3) 5000 UNITS TABS Take 15,000 Units by mouth daily. Reported on 08/11/2015  . Diclofenac Sodium CR 100 MG 24 hr tablet TAKE 1 TABLET DAILY  . ezetimibe (ZETIA) 10 MG tablet TAKE 1 TABLET DAILY  . gabapentin (NEURONTIN) 600 MG tablet Take 1/2 to 1 tablet 3 to 4 x /day as needed for pain  . lisinopril-hydrochlorothiazide (PRINZIDE,ZESTORETIC) 20-25 MG tablet TAKE 1 TABLET DAILY FOR BLOOD PRESSURE  . omeprazole (PRILOSEC) 40 MG capsule TAKE 1 CAPSULE BY MOUTH TWICE DAILY  . POTASSIUM PO Take 1 tablet by mouth daily.  . predniSONE (DELTASONE) 20 MG tablet Take 1 tablet 2 x /day for 3 days then 1 x /day   No current facility-administered medications on file prior to visit.     Problem list He has Vitamin D deficiency; Hyperlipidemia; Essential hypertension; Other abnormal glucose; Medication management; Osteoarthritis of right knee; BMI 25.0-25.9,adult; and Spinal stenosis, lumbar region, with neurogenic claudication on their problem list.   Review of Systems  Constitutional: Negative for chills, diaphoresis and fever.  HENT: Positive for congestion, sinus pressure, sneezing and sore throat. Negative for ear pain, trouble swallowing and voice change.   Eyes: Negative.   Respiratory: Positive for cough. Negative  for chest tightness, shortness of breath and wheezing.   Cardiovascular: Negative.   Gastrointestinal: Negative.   Genitourinary: Negative.   Musculoskeletal: Negative for neck pain.  Neurological: Negative.  Negative for headaches.       Objective:   Physical Exam Constitutional:      Appearance: He is well-developed.  HENT:     Head: Normocephalic and atraumatic.     Right Ear: Hearing and tympanic membrane normal.     Left Ear: Hearing and tympanic membrane normal.     Nose:     Right Sinus: Maxillary sinus tenderness present.     Left Sinus: Maxillary sinus tenderness present.     Mouth/Throat:     Pharynx: Uvula midline. Posterior oropharyngeal erythema present. No oropharyngeal exudate.     Tonsils: No tonsillar abscesses.  Eyes:     Conjunctiva/sclera: Conjunctivae normal.     Pupils: Pupils are equal, round, and reactive to light.  Neck:     Musculoskeletal: Normal range of motion and neck supple.  Cardiovascular:     Rate and Rhythm: Normal rate and regular rhythm.  Pulmonary:     Effort: Pulmonary effort is normal.     Breath sounds: Normal breath sounds.  Abdominal:     General: Bowel sounds are normal.     Palpations: Abdomen is soft.  Musculoskeletal: Normal range of motion.  Lymphadenopathy:     Cervical: No cervical adenopathy.  Skin:    General: Skin is warm and dry.     Findings: No  rash.  Neurological:     Mental Status: He is alert and oriented to person, place, and time.           Assessment & Plan:  Jermaine Stewart was seen today for cough.  Diagnoses and all orders for this visit:  Acute non-recurrent maxillary sinusitis Will hold the zpak and take if she is not getting better, increase fluids, rest, cont allergy pill Get on nasal spray, get on prednisone he has at home for 3-4 days -     azithromycin (ZITHROMAX) 250 MG tablet; Take 2 tablets (500 mg) on  Day 1,  followed by 1 tablet (250 mg) once daily on Days 2 through 5. -     fluticasone  (FLONASE) 50 MCG/ACT nasal spray; Place 2 sprays into both nostrils at bedtime.    Future Appointments  Date Time Provider Department Center  03/22/2018  2:00 PM [facility] None  04/22/2018  4:30 PM Quentin Mulling, PA-C [facility] None  07/31/2018  3:00 PM Lucky Cowboy, MD [facility] None

## 2018-03-20 NOTE — Patient Instructions (Signed)
INFORMATION ABOUT YOUR XRAY  [facility] go to [facility]ology and give them your name. They will have the order and take you back. You do not any paper work, I should get the result back today or tomorrow. This order is good for a year.   Get on nasal spray- take 5-7 days to kick in  Can do a steroid nasal spary 1-2 sparys at night each nostril. Remember to spray each nostril twice towards the outer part of your eye.  Do not sniff but instead pinch your nose and tilt your head back to help the medicine get into your sinuses.  The best time to do this is at bedtime. Stop if you get blurred vision or nose bleeds.   COLD INFORMATION  Try just the allergy pill and prednisone for a few days, you always want to give your body 10 days to fight off infection with help before taking an anabiotic.   BEWARE ANTIBIOTICS Antibiotics have been linked with colon infections, resistance and newest theory is colon cancer in 40-50 year olds. So it is VERY important to try to avoid them, antibiotics are NOT risk free medications.   If you are not feeling better make an office visit OR CONTACT US.   Here is more info below HOW TO TREAT VIRAL COUGH AND COLD SYMPTOMS:  -Symptoms usually last at least 1 week with the worst symptoms being around day 4.  - colds usually start with a sore throat and end with a cough, and the cough can take 2 weeks to get better.  -No antibiotics are needed for colds, flu, sore throats, cough, bronchitis UNLESS symptoms are longer than 7 days OR if you are getting better then get drastically worse.  -There are a lot of combination medications (Dayquil, Nyquil, Vicks 44, tyelnol cold and sinus, ETC). Please look at the ingredients on the back so that you are treating the correct symptoms and not doubling up on medications/ingredients.    Medicines you can use  Nasal congestion  Little Remedies saline spray (aerosol/mist)- can try this, it is in the kids section -  pseudoephedrine (Sudafed)- behind the counter, do not use if you have high blood pressure, medicine that have -D in them.  - phenylephrine (Sudafed PE) -Dextormethorphan + chlorpheniramine (Coridcidin HBP)- okay if you have high blood pressure -Oxymetazoline (Afrin) nasal spray- LIMIT to 3 days -Saline nasal spray -Neti pot (used distilled or bottled water)  Ear pain/congestion  -pseudoephedrine (sudafed) - Nasonex/flonase nasal spray  Fever  -Acetaminophen (Tyelnol) -Ibuprofen (Advil, motrin, aleve)  Sore Throat  -Acetaminophen (Tyelnol) -Ibuprofen (Advil, motrin, aleve) -Drink a lot of water -Gargle with salt water - Rest your voice (don't talk) -Throat sprays -Cough drops  Body Aches  -Acetaminophen (Tyelnol) -Ibuprofen (Advil, motrin, aleve)  Headache  -Acetaminophen (Tyelnol) -Ibuprofen (Advil, motrin, aleve) - Exedrin, Exedrin Migraine  Allergy symptoms (cough, sneeze, runny nose, itchy eyes) -Claritin or loratadine cheapest but likely the weakest  -Zyrtec or certizine at night because it can make you sleepy -The strongest is allegra or fexafinadine  Cheapest at [employer], [employer], [employer]  Cough  -Dextromethorphan (Delsym)- medicine that has DM in it -Guafenesin (Mucinex/Robitussin) - cough drops - drink lots of water  Chest Congestion  -Guafenesin (Mucinex/Robitussin)  Red Itchy Eyes  - Naphcon-A  Upset Stomach  - Bland diet (nothing spicy, greasy, fried, and high acid foods like tomatoes, oranges, berries) -OKAY- cereal, bread, soup, crackers, rice -Eat smaller more frequent meals -reduce caffeine,  no alcohol -Loperamide (Imodium-AD) if diarrhea -Prevacid for heart burn  General health when sick  -Hydration -wash your hands frequently -keep surfaces clean -change pillow cases and sheets often -Get fresh air but do not exercise strenuously -Vitamin D, double up on it - Vitamin C -Zinc

## 2018-03-21 NOTE — Pre-Procedure Instructions (Signed)
Jermaine Stewart  03/21/2018      [id number] [name] [name], [place] - [address] [facility] Jacquenette ShoneJULIAN [address] [facility] [place] [id number] Phone: [phone] Fax: [phone]  [facility] - [place] - [address] [place] [id number] Phone: [phone] Fax: [phone]    Your procedure is scheduled on Thursday, Feb. 6th   [facility] 5:30 AM             (posted surgery time 7:30a - 11:30a)   Call this number if you have problems the morning of surgery:  [phone]   Remember:   Do not eat any foods or drink any liquids after midnight, Wednesday.               7 days prior to surgery, STOP TAKING any Vitamins, Herbal Supplements, Anti-inflammatories, Blood Thinners.   Take these medicines the morning of surgery with A SIP OF WATER : Gabapentin, Omeprazole.    Do not wear jewelry - NO RINGS  Do not wear lotions, colognes or deodorant.   Men may shave face and neck.  Do not bring valuables to the hospital.  [facility] is not responsible for any belongings or valuables.  Contacts, dentures or bridgework may not be worn into surgery.  Leave your suitcase in the car.  After surgery it may be brought to your room.  For patients admitted to the hospital, discharge time will be determined by your treatment team.  Please read over the following fact sheets that you were given. Coughing and Deep Breathing, MRSA Information and Surgical Site Infection Prevention      [place]- Preparing For Surgery  Before surgery, you can play an important role. Because skin is not sterile, your skin needs to be as free of germs as possible. You can reduce the number of germs on your skin by washing with CHG (chlorahexidine gluconate) Soap before surgery.  CHG is an antiseptic cleaner which kills germs and bonds with the skin to continue killing germs even after  washing.    Oral Hygiene is also important to reduce your risk of infection.    Remember - BRUSH YOUR TEETH THE MORNING OF SURGERY WITH YOUR REGULAR TOOTHPASTE  Please do not use if you have an allergy to CHG or antibacterial soaps. If your skin becomes reddened/irritated stop using the CHG.  Do not shave (including legs and underarms) for at least 48 hours prior to first CHG shower. It is OK to shave your face.  Please follow these instructions carefully.   1. Shower the NIGHT BEFORE SURGERY and the MORNING OF SURGERY with CHG.   2. If you chose to wash your hair, wash your hair first as usual with your normal shampoo.  3. After you shampoo, rinse your hair and body thoroughly to remove the shampoo.  4. Use CHG as you would any other liquid soap. You can apply CHG directly to the skin and wash gently with a scrungie or a clean washcloth.   5. Apply the CHG Soap to your body ONLY FROM THE NECK DOWN.  Do not use on open wounds or open sores. Avoid contact with your eyes, ears, mouth and genitals (private parts). Wash Face and genitals (private parts)  with your normal soap.  6. Wash thoroughly, paying special attention to the area where your surgery will be performed.  7.  Thoroughly rinse your body with warm water from the neck down.  8. DO NOT shower/wash with your normal soap after using and rinsing off the CHG Soap.  9. Pat yourself dry with a CLEAN TOWEL.  10. Wear CLEAN PAJAMAS to bed the night before surgery, wear comfortable clothes the morning of surgery  11. Place CLEAN SHEETS on your bed the night of your first shower and DO NOT SLEEP WITH PETS.  Day of Surgery:  Do not apply any deodorants/lotions.  Please wear clean clothes to the hospital/surgery center.    Remember to brush your teeth WITH YOUR REGULAR TOOTHPASTE.

## 2018-03-22 ENCOUNTER — Encounter (HOSPITAL_COMMUNITY): Payer: Self-pay

## 2018-03-22 ENCOUNTER — Other Ambulatory Visit: Payer: Self-pay

## 2018-03-22 ENCOUNTER — Encounter (HOSPITAL_COMMUNITY)
Admission: RE | Admit: 2018-03-22 | Discharge: 2018-03-22 | Disposition: A | Discharge status: Home / Self Care | Payer: Commercial Managed Care - PPO | Source: Ambulatory Visit | Attending: Orthopedic Surgery | Admitting: Orthopedic Surgery

## 2018-03-22 DIAGNOSIS — M5416 Radiculopathy, lumbar region: Secondary | ICD-10-CM | POA: Diagnosis not present

## 2018-03-22 DIAGNOSIS — Z01812 Encounter for preprocedural laboratory examination: Secondary | ICD-10-CM | POA: Insufficient documentation

## 2018-03-22 HISTORY — DX: Gastro-esophageal reflux disease without esophagitis: K21.9

## 2018-03-22 LAB — URINALYSIS, ROUTINE W REFLEX MICROSCOPIC
Bilirubin Urine: NEGATIVE
GLUCOSE, UA: NEGATIVE mg/dL
Hgb urine dipstick: NEGATIVE
Ketones, ur: NEGATIVE mg/dL
Leukocytes, UA: NEGATIVE
Nitrite: NEGATIVE
Protein, ur: NEGATIVE mg/dL
Specific Gravity, Urine: 1.009 (ref 1.005–1.030)
pH: 7 (ref 5.0–8.0)

## 2018-03-22 LAB — CBC WITH DIFFERENTIAL/PLATELET
ABS IMMATURE GRANULOCYTES: 0.01 10*3/uL (ref 0.00–0.07)
Basophils Absolute: 0 10*3/uL (ref 0.0–0.1)
Basophils Relative: 1 %
Eosinophils Absolute: 0.3 10*3/uL (ref 0.0–0.5)
Eosinophils Relative: 6 %
HCT: 39 % (ref 39.0–52.0)
Hemoglobin: 12 g/dL — ABNORMAL LOW (ref 13.0–17.0)
Immature Granulocytes: 0 %
LYMPHS ABS: 1 10*3/uL (ref 0.7–4.0)
Lymphocytes Relative: 20 %
MCH: 26 pg (ref 26.0–34.0)
MCHC: 30.8 g/dL (ref 30.0–36.0)
MCV: 84.4 fL (ref 80.0–100.0)
Monocytes Absolute: 0.5 10*3/uL (ref 0.1–1.0)
Monocytes Relative: 10 %
Neutro Abs: 3.3 10*3/uL (ref 1.7–7.7)
Neutrophils Relative %: 63 %
Platelets: 184 10*3/uL (ref 150–400)
RBC: 4.62 MIL/uL (ref 4.22–5.81)
RDW: 18.6 % — ABNORMAL HIGH (ref 11.5–15.5)
WBC: 5.1 10*3/uL (ref 4.0–10.5)
nRBC: 0 % (ref 0.0–0.2)

## 2018-03-22 LAB — APTT: aPTT: 26 seconds (ref 24–36)

## 2018-03-22 LAB — COMPREHENSIVE METABOLIC PANEL
ALBUMIN: 4 g/dL (ref 3.5–5.0)
ALT: 28 U/L (ref 0–44)
AST: 31 U/L (ref 15–41)
Alkaline Phosphatase: 46 U/L (ref 38–126)
Anion gap: 11 (ref 5–15)
BUN: 7 mg/dL (ref 6–20)
CO2: 25 mmol/L (ref 22–32)
Calcium: 9.7 mg/dL (ref 8.9–10.3)
Chloride: 99 mmol/L (ref 98–111)
Creatinine, Ser: 0.94 mg/dL (ref 0.61–1.24)
GFR calc Af Amer: >60 mL/min (ref >60)
GFR calc non Af Amer: >60 mL/min (ref >60)
Glucose, Bld: 130 mg/dL — ABNORMAL HIGH (ref 70–99)
Potassium: 3.2 mmol/L — ABNORMAL LOW (ref 3.5–5.1)
Sodium: 135 mmol/L (ref 135–145)
Total Bilirubin: 0.7 mg/dL (ref 0.3–1.2)
Total Protein: 6.9 g/dL (ref 6.5–8.1)

## 2018-03-22 LAB — PROTIME-INR
INR: 1.04
Prothrombin Time: 13.5 seconds (ref 11.4–15.2)

## 2018-03-22 LAB — TYPE AND SCREEN
ABO/RH(D): A POS
Antibody Screen: NEGATIVE

## 2018-03-22 LAB — SURGICAL PCR SCREEN
MRSA, PCR: NEGATIVE
Staphylococcus aureus: POSITIVE — AB

## 2018-03-22 NOTE — Progress Notes (Signed)
Indication/reason for surgery on the order for consent read "cervical."  I spoke with Lupita Leash at Dr. Marshell Levan office and she is to put in a correct order for consent.  Informed patient we will sign consent form the DOS.

## 2018-03-22 NOTE — Progress Notes (Signed)
   03/22/18 1426  OBSTRUCTIVE SLEEP APNEA  Have you ever been diagnosed with sleep apnea through a sleep study? No  Do you snore loudly (loud enough to be heard through closed doors)?  1  Do you often feel tired, fatigued, or sleepy during the daytime (such as falling asleep during driving or talking to someone)? 0  Has anyone observed you stop breathing during your sleep? 1  Do you have, or are you being treated for high blood pressure? 1  BMI more than 35 kg/m2? 0  Age > 50 (1-yes) 1  Neck circumference greater than:Male 16 inches or larger, Male 17inches or larger? 0  Male Gender (Yes=1) 1  Obstructive Sleep Apnea Score 5  Score 5 or greater  Results sent to PCP

## 2018-03-22 NOTE — Progress Notes (Signed)
Mupirocin Ointment Rx called into [facility] in [place] for positive PCR of Staph. Pt notified and voiced understanding.

## 2018-03-22 NOTE — Progress Notes (Signed)
PCP - Dr. Lucky Cowboy Cardiologist - patient  Chest x-ray - n/a EKG - 06/2017 Stress Test - patient denies ECHO - patient denies Cardiac Cath - patient denies  Sleep Study - patient denies CPAP -   Fasting Blood Sugar - n/a Checks Blood Sugar _____ times a day  Blood Thinner Instructions: n/a Aspirin Instructions: n/a  Anesthesia review: n/a  Patient denies shortness of breath, fever, cough and chest pain at PAT appointment   Patient verbalized understanding of instructions that were given to them at the PAT appointment. Patient was also instructed that they will need to review over the PAT instructions again at home before surgery.

## 2018-03-25 ENCOUNTER — Other Ambulatory Visit: Payer: Self-pay | Admitting: Internal Medicine

## 2018-03-25 DIAGNOSIS — M5442 Lumbago with sciatica, left side: Principal | ICD-10-CM

## 2018-03-25 DIAGNOSIS — G8929 Other chronic pain: Secondary | ICD-10-CM

## 2018-03-27 NOTE — Anesthesia Preprocedure Evaluation (Addendum)
Anesthesia Evaluation  Patient identified by MRN, date of birth, ID band Patient awake    Reviewed: Allergy & Precautions, NPO status , Patient's Chart, lab work & pertinent test results  History of Anesthesia Complications Negative for: history of anesthetic complications  Airway Mallampati: III  TM Distance: >3 FB Neck ROM: Full    Dental no notable dental hx. (+) Teeth Intact   Pulmonary neg pulmonary ROS,    Pulmonary exam normal        Cardiovascular hypertension, Normal cardiovascular exam     Neuro/Psych negative neurological ROS  negative psych ROS   GI/Hepatic Neg liver ROS, PUD, GERD  ,  Endo/Other  negative endocrine ROS  Renal/GU negative Renal ROS  negative genitourinary   Musculoskeletal negative musculoskeletal ROS (+)   Abdominal   Peds  Hematology  (+) anemia ,   Anesthesia Other Findings 57 yo M for L4-5 TLIF - PMH: HTN, HLD, GERD, PUD, anemia  Reproductive/Obstetrics                           Anesthesia Physical Anesthesia Plan  ASA: II  Anesthesia Plan: General   Post-op Pain Management:    Induction: Intravenous  PONV Risk Score and Plan: 2 and Ondansetron, Dexamethasone, Midazolam and Treatment may vary due to age or medical condition  Airway Management Planned: Oral ETT  Additional Equipment: None  Intra-op Plan:   Post-operative Plan: Extubation in OR  Informed Consent: I have reviewed the patients History and Physical, chart, labs and discussed the procedure including the risks, benefits and alternatives for the proposed anesthesia with the patient or authorized representative who has indicated his/her understanding and acceptance.     Dental advisory given  Plan Discussed with:   Anesthesia Plan Comments:        Anesthesia Quick Evaluation

## 2018-03-28 ENCOUNTER — Inpatient Hospital Stay (HOSPITAL_COMMUNITY): Payer: Commercial Managed Care - PPO

## 2018-03-28 ENCOUNTER — Other Ambulatory Visit: Payer: Self-pay

## 2018-03-28 ENCOUNTER — Encounter (HOSPITAL_COMMUNITY): Payer: Self-pay | Admitting: *Deleted

## 2018-03-28 ENCOUNTER — Inpatient Hospital Stay (HOSPITAL_COMMUNITY): Payer: Commercial Managed Care - PPO | Admitting: Anesthesiology

## 2018-03-28 ENCOUNTER — Encounter (HOSPITAL_COMMUNITY)
Admission: AD | Disposition: A | Discharge status: Home / Self Care | Payer: Self-pay | Source: Home / Self Care | Attending: Orthopedic Surgery

## 2018-03-28 ENCOUNTER — Inpatient Hospital Stay (HOSPITAL_COMMUNITY)
Admission: AD | Admit: 2018-03-28 | Discharge: 2018-03-29 | DRG: 455 | Disposition: A | Discharge status: Home / Self Care | Payer: Commercial Managed Care - PPO | Attending: Orthopedic Surgery | Admitting: Orthopedic Surgery

## 2018-03-28 DIAGNOSIS — Z96653 Presence of artificial knee joint, bilateral: Secondary | ICD-10-CM | POA: Diagnosis present

## 2018-03-28 DIAGNOSIS — Z7951 Long term (current) use of inhaled steroids: Secondary | ICD-10-CM

## 2018-03-28 DIAGNOSIS — M5416 Radiculopathy, lumbar region: Secondary | ICD-10-CM | POA: Diagnosis not present

## 2018-03-28 DIAGNOSIS — Z79899 Other long term (current) drug therapy: Secondary | ICD-10-CM

## 2018-03-28 DIAGNOSIS — Z8249 Family history of ischemic heart disease and other diseases of the circulatory system: Secondary | ICD-10-CM

## 2018-03-28 DIAGNOSIS — Z803 Family history of malignant neoplasm of breast: Secondary | ICD-10-CM | POA: Diagnosis not present

## 2018-03-28 DIAGNOSIS — M5410 Radiculopathy, site unspecified: Secondary | ICD-10-CM | POA: Diagnosis present

## 2018-03-28 DIAGNOSIS — Z808 Family history of malignant neoplasm of other organs or systems: Secondary | ICD-10-CM

## 2018-03-28 DIAGNOSIS — Z823 Family history of stroke: Secondary | ICD-10-CM

## 2018-03-28 DIAGNOSIS — M48062 Spinal stenosis, lumbar region with neurogenic claudication: Secondary | ICD-10-CM | POA: Diagnosis not present

## 2018-03-28 DIAGNOSIS — M48061 Spinal stenosis, lumbar region without neurogenic claudication: Secondary | ICD-10-CM | POA: Diagnosis present

## 2018-03-28 DIAGNOSIS — E785 Hyperlipidemia, unspecified: Secondary | ICD-10-CM | POA: Diagnosis present

## 2018-03-28 DIAGNOSIS — M4316 Spondylolisthesis, lumbar region: Secondary | ICD-10-CM | POA: Diagnosis not present

## 2018-03-28 DIAGNOSIS — K219 Gastro-esophageal reflux disease without esophagitis: Secondary | ICD-10-CM | POA: Diagnosis present

## 2018-03-28 DIAGNOSIS — Z888 Allergy status to other drugs, medicaments and biological substances status: Secondary | ICD-10-CM

## 2018-03-28 DIAGNOSIS — Z981 Arthrodesis status: Secondary | ICD-10-CM | POA: Diagnosis not present

## 2018-03-28 DIAGNOSIS — I10 Essential (primary) hypertension: Secondary | ICD-10-CM | POA: Diagnosis present

## 2018-03-28 DIAGNOSIS — M5136 Other intervertebral disc degeneration, lumbar region: Secondary | ICD-10-CM | POA: Diagnosis not present

## 2018-03-28 DIAGNOSIS — M5116 Intervertebral disc disorders with radiculopathy, lumbar region: Secondary | ICD-10-CM | POA: Diagnosis not present

## 2018-03-28 DIAGNOSIS — Z419 Encounter for procedure for purposes other than remedying health state, unspecified: Secondary | ICD-10-CM

## 2018-03-28 DIAGNOSIS — M4326 Fusion of spine, lumbar region: Secondary | ICD-10-CM | POA: Diagnosis not present

## 2018-03-28 DIAGNOSIS — M9983 Other biomechanical lesions of lumbar region: Secondary | ICD-10-CM | POA: Diagnosis not present

## 2018-03-28 HISTORY — PX: TRANSFORAMINAL LUMBAR INTERBODY FUSION (TLIF) WITH PEDICLE SCREW FIXATION 1 LEVEL: SHX6141

## 2018-03-28 LAB — POTASSIUM: Potassium: 3.4 mmol/L — ABNORMAL LOW (ref 3.5–5.1)

## 2018-03-28 SURGERY — TRANSFORAMINAL LUMBAR INTERBODY FUSION (TLIF) WITH PEDICLE SCREW FIXATION 1 LEVEL
Anesthesia: General | Laterality: Left

## 2018-03-28 MED ORDER — FENTANYL CITRATE (PF) 250 MCG/5ML IJ SOLN
INTRAMUSCULAR | Status: DC | PRN
  Administered 2018-03-28 (×3): 50 ug via INTRAVENOUS
  Administered 2018-03-28: 100 ug via INTRAVENOUS

## 2018-03-28 MED ORDER — ONDANSETRON HCL 4 MG/2ML IJ SOLN: 4.0000 mg | Freq: Four times a day (QID) | INTRAMUSCULAR | Status: DC | PRN

## 2018-03-28 MED ORDER — METHYLENE BLUE 0.5 % INJ SOLN
INTRAVENOUS | Status: AC
  Filled 2018-03-28: qty 10

## 2018-03-28 MED ORDER — PROPOFOL 10 MG/ML IV BOLUS
INTRAVENOUS | Status: DC | PRN
  Administered 2018-03-28: 200 mg via INTRAVENOUS

## 2018-03-28 MED ORDER — ACETAMINOPHEN 10 MG/ML IV SOLN
INTRAVENOUS | Status: AC
  Filled 2018-03-28: qty 100

## 2018-03-28 MED ORDER — POTASSIUM CHLORIDE 10 MEQ/100ML IV SOLN
10.0000 meq | INTRAVENOUS | Status: AC
  Administered 2018-03-28 (×2): 10 meq via INTRAVENOUS
  Filled 2018-03-28 (×2): qty 100

## 2018-03-28 MED ORDER — HYDROCHLOROTHIAZIDE 25 MG PO TABS: 25.0000 mg | ORAL_TABLET | Freq: Every day | ORAL | Status: DC

## 2018-03-28 MED ORDER — LISINOPRIL-HYDROCHLOROTHIAZIDE 20-25 MG PO TABS: 1.0000 | ORAL_TABLET | Freq: Every day | ORAL | Status: DC

## 2018-03-28 MED ORDER — ARTIFICIAL TEARS OPHTHALMIC OINT
TOPICAL_OINTMENT | OPHTHALMIC | Status: AC
  Filled 2018-03-28: qty 3.5

## 2018-03-28 MED ORDER — PROPOFOL 500 MG/50ML IV EMUL
INTRAVENOUS | Status: DC | PRN
  Administered 2018-03-28: 100 ug/kg/min via INTRAVENOUS

## 2018-03-28 MED ORDER — FLUTICASONE PROPIONATE 50 MCG/ACT NA SUSP
2.0000 | Freq: Every day | NASAL | Status: DC
  Administered 2018-03-28: 2 via NASAL
  Filled 2018-03-28: qty 16

## 2018-03-28 MED ORDER — BUPIVACAINE LIPOSOME 1.3 % IJ SUSP
20.0000 mL | Freq: Once | INTRAMUSCULAR | Status: DC
  Filled 2018-03-28: qty 20

## 2018-03-28 MED ORDER — MIDAZOLAM HCL 2 MG/2ML IJ SOLN
INTRAMUSCULAR | Status: AC
  Filled 2018-03-28: qty 2

## 2018-03-28 MED ORDER — PANTOPRAZOLE SODIUM 40 MG PO TBEC
80.0000 mg | DELAYED_RELEASE_TABLET | Freq: Every day | ORAL | Status: DC
  Administered 2018-03-28: 80 mg via ORAL

## 2018-03-28 MED ORDER — ONDANSETRON HCL 4 MG PO TABS: 4.0000 mg | ORAL_TABLET | Freq: Four times a day (QID) | ORAL | Status: DC | PRN

## 2018-03-28 MED ORDER — FLEET ENEMA 7-19 GM/118ML RE ENEM: 1.0000 | ENEMA | Freq: Once | RECTAL | Status: DC | PRN

## 2018-03-28 MED ORDER — SODIUM CHLORIDE 0.9% FLUSH: 3.0000 mL | INTRAVENOUS | Status: DC | PRN

## 2018-03-28 MED ORDER — BUPIVACAINE LIPOSOME 1.3 % IJ SUSP
INTRAMUSCULAR | Status: DC | PRN
  Administered 2018-03-28: 20 mL

## 2018-03-28 MED ORDER — ONDANSETRON HCL 4 MG/2ML IJ SOLN
INTRAMUSCULAR | Status: DC | PRN
  Administered 2018-03-28: 4 mg via INTRAVENOUS

## 2018-03-28 MED ORDER — DEXMEDETOMIDINE HCL IN NACL 200 MCG/50ML IV SOLN
INTRAVENOUS | Status: AC
  Filled 2018-03-28: qty 50

## 2018-03-28 MED ORDER — ACETAMINOPHEN 325 MG PO TABS: 650.0000 mg | ORAL_TABLET | ORAL | Status: DC | PRN

## 2018-03-28 MED ORDER — SODIUM CHLORIDE 0.9 % IV SOLN
INTRAVENOUS | Status: DC | PRN
  Administered 2018-03-28: 25 ug/min via INTRAVENOUS

## 2018-03-28 MED ORDER — DOCUSATE SODIUM 100 MG PO CAPS
100.0000 mg | ORAL_CAPSULE | Freq: Two times a day (BID) | ORAL | Status: DC
  Administered 2018-03-28: 100 mg via ORAL
  Filled 2018-03-28: qty 1

## 2018-03-28 MED ORDER — THROMBIN 20000 UNITS EX SOLR: CUTANEOUS | Status: DC | PRN

## 2018-03-28 MED ORDER — OXYCODONE-ACETAMINOPHEN 5-325 MG PO TABS
1.0000 | ORAL_TABLET | ORAL | Status: DC | PRN
  Administered 2018-03-28 – 2018-03-29 (×3): 2 via ORAL
  Filled 2018-03-28 (×3): qty 2

## 2018-03-28 MED ORDER — OXYCODONE HCL 5 MG PO TABS: 5.0000 mg | ORAL_TABLET | Freq: Once | ORAL | Status: DC | PRN

## 2018-03-28 MED ORDER — OXYCODONE-ACETAMINOPHEN 5-325 MG PO TABS: 1.0000 | ORAL_TABLET | ORAL | 0 refills | Status: DC | PRN

## 2018-03-28 MED ORDER — SUGAMMADEX SODIUM 200 MG/2ML IV SOLN
INTRAVENOUS | Status: DC | PRN
  Administered 2018-03-28: 158.8 mg via INTRAVENOUS

## 2018-03-28 MED ORDER — THROMBIN 20000 UNITS EX SOLR
CUTANEOUS | Status: AC
  Filled 2018-03-28: qty 20000

## 2018-03-28 MED ORDER — LIDOCAINE 2% (20 MG/ML) 5 ML SYRINGE
INTRAMUSCULAR | Status: DC | PRN
  Administered 2018-03-28: 100 mg via INTRAVENOUS

## 2018-03-28 MED ORDER — METHYLENE BLUE 0.5 % INJ SOLN
INTRAVENOUS | Status: DC | PRN
  Administered 2018-03-28: 1 mL

## 2018-03-28 MED ORDER — CEFAZOLIN SODIUM-DEXTROSE 2-4 GM/100ML-% IV SOLN
2.0000 g | INTRAVENOUS | Status: AC
  Administered 2018-03-28: 2 g via INTRAVENOUS
  Filled 2018-03-28: qty 100

## 2018-03-28 MED ORDER — ROCURONIUM BROMIDE 50 MG/5ML IV SOSY
PREFILLED_SYRINGE | INTRAVENOUS | Status: AC
  Filled 2018-03-28: qty 5

## 2018-03-28 MED ORDER — POTASSIUM CHLORIDE IN NACL 20-0.9 MEQ/L-% IV SOLN: INTRAVENOUS | Status: DC

## 2018-03-28 MED ORDER — PHENOL 1.4 % MT LIQD: 1.0000 | OROMUCOSAL | Status: DC | PRN

## 2018-03-28 MED ORDER — VITAMIN D-3 125 MCG (5000 UT) PO TABS: 15000.0000 [IU] | ORAL_TABLET | Freq: Every day | ORAL | Status: DC

## 2018-03-28 MED ORDER — LACTATED RINGERS IV SOLN
INTRAVENOUS | Status: DC | PRN
  Administered 2018-03-28 (×2): via INTRAVENOUS

## 2018-03-28 MED ORDER — FERROUS SULFATE 325 (65 FE) MG PO TABS: 325.0000 mg | ORAL_TABLET | Freq: Every day | ORAL | Status: DC

## 2018-03-28 MED ORDER — BUPIVACAINE-EPINEPHRINE (PF) 0.25% -1:200000 IJ SOLN
INTRAMUSCULAR | Status: AC
  Filled 2018-03-28: qty 10

## 2018-03-28 MED ORDER — DEXAMETHASONE SODIUM PHOSPHATE 10 MG/ML IJ SOLN
INTRAMUSCULAR | Status: AC
  Filled 2018-03-28: qty 1

## 2018-03-28 MED ORDER — BISACODYL 5 MG PO TBEC: 5.0000 mg | DELAYED_RELEASE_TABLET | Freq: Every day | ORAL | Status: DC | PRN

## 2018-03-28 MED ORDER — SODIUM CHLORIDE 0.9 % IV SOLN: 250.0000 mL | INTRAVENOUS | Status: DC

## 2018-03-28 MED ORDER — GABAPENTIN 600 MG PO TABS
300.0000 mg | ORAL_TABLET | Freq: Two times a day (BID) | ORAL | Status: DC
  Administered 2018-03-28 (×2): 300 mg via ORAL
  Filled 2018-03-28 (×2): qty 1

## 2018-03-28 MED ORDER — PROPOFOL 1000 MG/100ML IV EMUL
INTRAVENOUS | Status: AC
  Filled 2018-03-28: qty 100

## 2018-03-28 MED ORDER — MENTHOL 3 MG MT LOZG: 1.0000 | LOZENGE | OROMUCOSAL | Status: DC | PRN

## 2018-03-28 MED ORDER — FENTANYL CITRATE (PF) 250 MCG/5ML IJ SOLN
INTRAMUSCULAR | Status: AC
  Filled 2018-03-28: qty 5

## 2018-03-28 MED ORDER — ACETAMINOPHEN 650 MG RE SUPP: 650.0000 mg | RECTAL | Status: DC | PRN

## 2018-03-28 MED ORDER — HYDROMORPHONE HCL 1 MG/ML IJ SOLN
0.2500 mg | INTRAMUSCULAR | Status: DC | PRN
  Administered 2018-03-28 (×2): 0.5 mg via INTRAVENOUS

## 2018-03-28 MED ORDER — LIDOCAINE 2% (20 MG/ML) 5 ML SYRINGE
INTRAMUSCULAR | Status: AC
  Filled 2018-03-28: qty 5

## 2018-03-28 MED ORDER — ROCURONIUM BROMIDE 10 MG/ML (PF) SYRINGE
PREFILLED_SYRINGE | INTRAVENOUS | Status: DC | PRN
  Administered 2018-03-28: 30 mg via INTRAVENOUS
  Administered 2018-03-28: 20 mg via INTRAVENOUS
  Administered 2018-03-28 (×2): 50 mg via INTRAVENOUS

## 2018-03-28 MED ORDER — OXYCODONE HCL 5 MG/5ML PO SOLN: 5.0000 mg | Freq: Once | ORAL | Status: DC | PRN

## 2018-03-28 MED ORDER — SODIUM CHLORIDE 0.9 % IV SOLN
INTRAVENOUS | Status: DC | PRN
  Administered 2018-03-28: 07:00:00

## 2018-03-28 MED ORDER — HYDROMORPHONE HCL 1 MG/ML IJ SOLN
INTRAMUSCULAR | Status: AC
  Administered 2018-03-28: 0.5 mg via INTRAVENOUS
  Filled 2018-03-28: qty 1

## 2018-03-28 MED ORDER — DIAZEPAM 5 MG PO TABS
5.0000 mg | ORAL_TABLET | Freq: Four times a day (QID) | ORAL | Status: DC | PRN
  Administered 2018-03-28 – 2018-03-29 (×3): 5 mg via ORAL
  Filled 2018-03-28 (×3): qty 1

## 2018-03-28 MED ORDER — PHENYLEPHRINE 40 MCG/ML (10ML) SYRINGE FOR IV PUSH (FOR BLOOD PRESSURE SUPPORT)
PREFILLED_SYRINGE | INTRAVENOUS | Status: AC
  Filled 2018-03-28: qty 10

## 2018-03-28 MED ORDER — DEXMEDETOMIDINE HCL IN NACL 200 MCG/50ML IV SOLN
INTRAVENOUS | Status: DC | PRN
  Administered 2018-03-28 (×2): 8 ug via INTRAVENOUS
  Administered 2018-03-28: 12 ug via INTRAVENOUS

## 2018-03-28 MED ORDER — MIDAZOLAM HCL 2 MG/2ML IJ SOLN
INTRAMUSCULAR | Status: DC | PRN
  Administered 2018-03-28: 2 mg via INTRAVENOUS

## 2018-03-28 MED ORDER — PROPOFOL 10 MG/ML IV BOLUS
INTRAVENOUS | Status: AC
  Filled 2018-03-28: qty 20

## 2018-03-28 MED ORDER — DEXAMETHASONE SODIUM PHOSPHATE 10 MG/ML IJ SOLN
INTRAMUSCULAR | Status: DC | PRN
  Administered 2018-03-28: 10 mg via INTRAVENOUS

## 2018-03-28 MED ORDER — ROCURONIUM BROMIDE 50 MG/5ML IV SOSY
PREFILLED_SYRINGE | INTRAVENOUS | Status: AC
  Filled 2018-03-28: qty 10

## 2018-03-28 MED ORDER — EZETIMIBE 10 MG PO TABS
10.0000 mg | ORAL_TABLET | Freq: Every day | ORAL | Status: DC
  Filled 2018-03-28: qty 1

## 2018-03-28 MED ORDER — SODIUM CHLORIDE 0.9% FLUSH: 3.0000 mL | Freq: Two times a day (BID) | INTRAVENOUS | Status: DC

## 2018-03-28 MED ORDER — ONDANSETRON HCL 4 MG/2ML IJ SOLN
INTRAMUSCULAR | Status: AC
  Filled 2018-03-28: qty 2

## 2018-03-28 MED ORDER — PROPOFOL 10 MG/ML IV BOLUS
INTRAVENOUS | Status: AC
  Filled 2018-03-28: qty 40

## 2018-03-28 MED ORDER — THROMBIN 20000 UNITS EX SOLR
CUTANEOUS | Status: DC | PRN
  Administered 2018-03-28: 08:00:00 via TOPICAL

## 2018-03-28 MED ORDER — MORPHINE SULFATE (PF) 2 MG/ML IV SOLN: 1.0000 mg | INTRAVENOUS | Status: DC | PRN

## 2018-03-28 MED ORDER — ACETAMINOPHEN 10 MG/ML IV SOLN
1000.0000 mg | INTRAVENOUS | Status: AC
  Administered 2018-03-28: 1000 mg via INTRAVENOUS
  Filled 2018-03-28 (×2): qty 100

## 2018-03-28 MED ORDER — POVIDONE-IODINE 7.5 % EX SOLN
Freq: Once | CUTANEOUS | Status: DC
  Filled 2018-03-28: qty 118

## 2018-03-28 MED ORDER — ZOLPIDEM TARTRATE 5 MG PO TABS: 5.0000 mg | ORAL_TABLET | Freq: Every evening | ORAL | Status: DC | PRN

## 2018-03-28 MED ORDER — SENNOSIDES-DOCUSATE SODIUM 8.6-50 MG PO TABS: 1.0000 | ORAL_TABLET | Freq: Every evening | ORAL | Status: DC | PRN

## 2018-03-28 MED ORDER — DIAZEPAM 5 MG PO TABS: 5.0000 mg | ORAL_TABLET | Freq: Four times a day (QID) | ORAL | 0 refills | Status: DC | PRN

## 2018-03-28 MED ORDER — ONDANSETRON HCL 4 MG/2ML IJ SOLN: 4.0000 mg | Freq: Once | INTRAMUSCULAR | Status: DC | PRN

## 2018-03-28 MED ORDER — ALUM & MAG HYDROXIDE-SIMETH 200-200-20 MG/5ML PO SUSP: 30.0000 mL | Freq: Four times a day (QID) | ORAL | Status: DC | PRN

## 2018-03-28 MED ORDER — CEFAZOLIN SODIUM-DEXTROSE 2-4 GM/100ML-% IV SOLN
2.0000 g | Freq: Three times a day (TID) | INTRAVENOUS | Status: AC
  Administered 2018-03-28 (×2): 2 g via INTRAVENOUS
  Filled 2018-03-28 (×2): qty 100

## 2018-03-28 MED ORDER — 0.9 % SODIUM CHLORIDE (POUR BTL) OPTIME
TOPICAL | Status: DC | PRN
  Administered 2018-03-28 (×2): 1000 mL

## 2018-03-28 MED ORDER — LISINOPRIL 20 MG PO TABS: 20.0000 mg | ORAL_TABLET | Freq: Every day | ORAL | Status: DC

## 2018-03-28 MED ORDER — BUPIVACAINE-EPINEPHRINE 0.25% -1:200000 IJ SOLN
INTRAMUSCULAR | Status: DC | PRN
  Administered 2018-03-28 (×2): 10 mL

## 2018-03-28 SURGICAL SUPPLY — 86 items
BENZOIN TINCTURE PRP APPL 2/3 (GAUZE/BANDAGES/DRESSINGS) ×2 IMPLANT
BLADE CLIPPER SURG (BLADE) ×2 IMPLANT
BUR PRESCISION 1.7 ELITE (BURR) ×2 IMPLANT
BUR ROUND FLUTED 5 RND (BURR) ×2 IMPLANT
BUR ROUND PRECISION 4.0 (BURR) IMPLANT
BUR SABER RD CUTTING 3.0 (BURR) IMPLANT
CAGE CONCORDE BULLET 9X10X27 (Cage) ×2 IMPLANT
CARTRIDGE OIL MAESTRO DRILL (MISCELLANEOUS) ×1 IMPLANT
CONT SPEC 4OZ CLIKSEAL STRL BL (MISCELLANEOUS) ×2 IMPLANT
COVER MAYO STAND STRL (DRAPES) IMPLANT
COVER SURGICAL LIGHT HANDLE (MISCELLANEOUS) ×2 IMPLANT
COVER WAND RF STERILE (DRAPES) IMPLANT
DIFFUSER DRILL AIR PNEUMATIC (MISCELLANEOUS) ×2 IMPLANT
DRAIN CHANNEL 15F RND FF W/TCR (WOUND CARE) IMPLANT
DRAPE C-ARM 42X72 X-RAY (DRAPES) ×2 IMPLANT
DRAPE C-ARMOR (DRAPES) ×2 IMPLANT
DRAPE POUCH INSTRU U-SHP 10X18 (DRAPES) ×2 IMPLANT
DRAPE SURG 17X23 STRL (DRAPES) ×8 IMPLANT
DURAPREP 26ML APPLICATOR (WOUND CARE) ×2 IMPLANT
ELECT BLADE 4.0 EZ CLEAN MEGAD (MISCELLANEOUS) ×2
ELECT CAUTERY BLADE 6.4 (BLADE) ×2 IMPLANT
ELECT REM PT RETURN 9FT ADLT (ELECTROSURGICAL) ×2
ELECTRODE BLDE 4.0 EZ CLN MEGD (MISCELLANEOUS) ×1 IMPLANT
ELECTRODE REM PT RTRN 9FT ADLT (ELECTROSURGICAL) ×1 IMPLANT
EVACUATOR SILICONE 100CC (DRAIN) IMPLANT
FEE INTRAOP MONITOR IMPULS NCS (MISCELLANEOUS) ×1 IMPLANT
FILTER STRAW FLUID ASPIR (MISCELLANEOUS) ×2 IMPLANT
GAUZE 4X4 16PLY RFD (DISPOSABLE) ×2 IMPLANT
GAUZE SPONGE 4X4 12PLY STRL (GAUZE/BANDAGES/DRESSINGS) ×2 IMPLANT
GLOVE BIO SURGEON STRL SZ7 (GLOVE) ×4 IMPLANT
GLOVE BIO SURGEON STRL SZ8 (GLOVE) ×4 IMPLANT
GLOVE BIOGEL PI IND STRL 7.0 (GLOVE) ×2 IMPLANT
GLOVE BIOGEL PI IND STRL 7.5 (GLOVE) ×1 IMPLANT
GLOVE BIOGEL PI IND STRL 8 (GLOVE) ×2 IMPLANT
GLOVE BIOGEL PI INDICATOR 7.0 (GLOVE) ×2
GLOVE BIOGEL PI INDICATOR 7.5 (GLOVE) ×1
GLOVE BIOGEL PI INDICATOR 8 (GLOVE) ×2
GOWN STRL REUS W/ TWL LRG LVL3 (GOWN DISPOSABLE) ×3 IMPLANT
GOWN STRL REUS W/ TWL XL LVL3 (GOWN DISPOSABLE) ×2 IMPLANT
GOWN STRL REUS W/TWL LRG LVL3 (GOWN DISPOSABLE) ×3
GOWN STRL REUS W/TWL XL LVL3 (GOWN DISPOSABLE) ×2
INTRAOP MONITOR FEE IMPULS NCS (MISCELLANEOUS) ×1
INTRAOP MONITOR FEE IMPULSE (MISCELLANEOUS) ×1
IV CATH 14GX2 1/4 (CATHETERS) ×2 IMPLANT
KIT BASIN OR (CUSTOM PROCEDURE TRAY) ×2 IMPLANT
KIT POSITION SURG JACKSON T1 (MISCELLANEOUS) ×2 IMPLANT
KIT TURNOVER KIT B (KITS) ×2 IMPLANT
MARKER SKIN DUAL TIP RULER LAB (MISCELLANEOUS) ×4 IMPLANT
MIX DBX 10CC 35% BONE (Bone Implant) ×2 IMPLANT
NDL SAFETY ECLIPSE 18X1.5 (NEEDLE) ×1 IMPLANT
NEEDLE 22X1 1/2 (OR ONLY) (NEEDLE) ×4 IMPLANT
NEEDLE HYPO 18GX1.5 SHARP (NEEDLE) ×1
NEEDLE HYPO 25GX1X1/2 BEV (NEEDLE) ×2 IMPLANT
NEEDLE SPNL 18GX3.5 QUINCKE PK (NEEDLE) ×4 IMPLANT
NS IRRIG 1000ML POUR BTL (IV SOLUTION) ×2 IMPLANT
OIL CARTRIDGE MAESTRO DRILL (MISCELLANEOUS) ×2
PACK LAMINECTOMY NEURO (CUSTOM PROCEDURE TRAY) ×2 IMPLANT
PACK LAMINECTOMY ORTHO (CUSTOM PROCEDURE TRAY) IMPLANT
PACK UNIVERSAL I (CUSTOM PROCEDURE TRAY) ×2 IMPLANT
PAD ARMBOARD 7.5X6 YLW CONV (MISCELLANEOUS) ×4 IMPLANT
PATTIES SURGICAL .5 X1 (DISPOSABLE) ×2 IMPLANT
PATTIES SURGICAL .5X1.5 (GAUZE/BANDAGES/DRESSINGS) ×2 IMPLANT
PROBE PEDCLE PROBE MAGSTM DISP (MISCELLANEOUS) ×2 IMPLANT
ROD PRE BENT EXP 40MM (Rod) ×2 IMPLANT
ROD PRE BENT EXPEDIUM 35MM (Rod) ×2 IMPLANT
SCREW SET SINGLE INNER (Screw) ×8 IMPLANT
SCREW VIPER 7MMX30MM CORTICAL (Screw) ×6 IMPLANT
SCREW VIPER CORT FIX 6X35 (Screw) ×2 IMPLANT
SPONGE INTESTINAL PEANUT (DISPOSABLE) ×2 IMPLANT
SPONGE SURGIFOAM ABS GEL 100 (HEMOSTASIS) ×2 IMPLANT
STRIP CLOSURE SKIN 1/2X4 (GAUZE/BANDAGES/DRESSINGS) ×2 IMPLANT
SURGIFLO W/THROMBIN 8M KIT (HEMOSTASIS) IMPLANT
SUT MNCRL AB 4-0 PS2 18 (SUTURE) ×2 IMPLANT
SUT VIC AB 0 CT1 18XCR BRD 8 (SUTURE) ×1 IMPLANT
SUT VIC AB 0 CT1 8-18 (SUTURE) ×1
SUT VIC AB 1 CT1 18XCR BRD 8 (SUTURE) ×1 IMPLANT
SUT VIC AB 1 CT1 8-18 (SUTURE) ×1
SUT VIC AB 2-0 CT2 18 VCP726D (SUTURE) ×2 IMPLANT
SYR 20CC LL (SYRINGE) ×4 IMPLANT
SYR BULB IRRIGATION 50ML (SYRINGE) ×2 IMPLANT
SYR CONTROL 10ML LL (SYRINGE) ×4 IMPLANT
SYR TB 1ML LUER SLIP (SYRINGE) ×2 IMPLANT
TAPE CLOTH SURG 4X10 WHT LF (GAUZE/BANDAGES/DRESSINGS) ×2 IMPLANT
TRAY FOLEY MTR SLVR 16FR STAT (SET/KITS/TRAYS/PACK) ×2 IMPLANT
WATER STERILE IRR 1000ML POUR (IV SOLUTION) ×2 IMPLANT
YANKAUER SUCT BULB TIP NO VENT (SUCTIONS) ×2 IMPLANT

## 2018-03-28 NOTE — H&P (Signed)
PREOPERATIVE H&P  Chief Complaint: Left leg pain  HPI: Jermaine Stewart is a 58 y.o. male who presents with ongoing pain in the left leg  MRI reveals severe NF stenosis at L4/5 with a spondylolisthesis   Patient has failed multiple forms of conservative care and continues to have pain (see office notes for additional details regarding the patient's full course of treatment)  Past Medical History:  Diagnosis Date  . Arthritis   . GERD (gastroesophageal reflux disease)   . Hyperlipidemia   . Hypertension    Past Surgical History:  Procedure Laterality Date  . HERNIA REPAIR Bilateral    15 years ago  . 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 TOTAL KNEE ARTHROPLASTY Right 03/04/2014   Procedure: TOTAL KNEE ARTHROPLASTY;  Surgeon: Nestor Lewandowsky, MD;  Location: [facility] OR;  Service: Orthopedics;  Laterality: Right;  Marland Kitchen VASECTOMY     Social History   Socioeconomic History  . Marital status: Married    Spouse name: Not on file  . Number of children: Not on file  . Years of education: Not on file  . Highest education level: Not on file  Occupational History  . Not on file  Social Needs  . Financial resource strain: Not on file  . Food insecurity:    Worry: Not on file    Inability: Not on file  . Transportation needs:    Medical: Not on file    Non-medical: Not on file  Tobacco Use  . Smoking status: Never Smoker  . Smokeless tobacco: Former Neurosurgeon    Types: Chew  Substance and Sexual Activity  . Alcohol use: Yes    Alcohol/week: 14.0 standard drinks    Types: 14 Cans of beer per week    Comment: 2-3 beer a day  . Drug use: No  . Sexual activity: Yes    Partners: Female    Birth control/protection: None  Lifestyle  . Physical activity:    Days per week: Not on file    Minutes per session: Not on file  . Stress: Not on file    Relationships  . Social connections:    Talks on phone: Not on file    Gets together: Not on file    Attends religious service: Not on file    Active member of club or organization: Not on file    Attends meetings of clubs or organizations: Not on file    Relationship status: Not on file  Other Topics Concern  . Not on file  Social History Narrative  . Not on file   Family History  Problem Relation Age of Onset  . Breast cancer Mother   . Throat cancer Father   . Hypertension Brother   . Stroke Brother   . Hypertension Brother   . Hypertension Brother   . Colon cancer Neg Hx   . Stomach cancer Neg Hx   . Rectal cancer Neg Hx    Allergies  Allergen Reactions  . Atorvastatin Other (See Comments)    Arthralgias   Prior to Admission medications   Medication Sig Start Date End Date Taking? Authorizing Provider  Cholecalciferol (VITAMIN D-3) 5000 UNITS TABS Take 15,000 Units by mouth daily.    Yes [provider]  ezetimibe (ZETIA) 10 MG tablet TAKE 1 TABLET DAILY Patient taking differently: Take 10 mg  by mouth daily.  02/28/18  Yes Lucky Cowboy, MD  ferrous sulfate 325 (65 FE) MG EC tablet Take 325 mg by mouth daily with breakfast.   Yes [provider]  fluticasone (FLONASE) 50 MCG/ACT nasal spray Place 2 sprays into both nostrils at bedtime. 03/20/18  Yes Quentin Mulling, PA-C  gabapentin (NEURONTIN) 600 MG tablet TAKE 1/2 TO 1 TABLET BY MOUTH 3 TO 4 TIMES PER DAY AS NEEDED FOR PAIN 03/25/18  Yes Lucky Cowboy, MD  lisinopril-hydrochlorothiazide (PRINZIDE,ZESTORETIC) 20-25 MG tablet TAKE 1 TABLET DAILY FOR BLOOD PRESSURE Patient taking differently: Take 1 tablet by mouth daily.  03/13/18  Yes Lucky Cowboy, MD  omeprazole (PRILOSEC) 40 MG capsule TAKE 1 CAPSULE BY MOUTH TWICE DAILY Patient taking differently: Take 40 mg by mouth daily.  01/25/18  Yes Quentin Mulling, PA-C  Diclofenac Sodium CR 100 MG 24 hr tablet TAKE 1 TABLET DAILY Patient not taking:  Reported on 03/21/2018 03/18/17   Lucky Cowboy, MD     All other systems have been reviewed and were otherwise negative with the exception of those mentioned in the HPI and as above.  Physical Exam: Vitals:   03/28/18 0555  BP: (!) 157/85  Pulse: (!) 102  Resp: 18  Temp: 98.6 F (37 C)  SpO2: 96%    Body mass index is 25.84 kg/m.  General: Alert, no acute distress Cardiovascular: No pedal edema Respiratory: No cyanosis, no use of accessory musculature Skin: No lesions in the area of chief complaint Neurologic: Sensation intact distally Psychiatric: Patient is competent for consent with normal mood and affect Lymphatic: No axillary or cervical lymphadenopathy  MUSCULOSKELETAL: + SLR on the left  Assessment/Plan: SEVERE LEFT L4-L5 NEUROFORAMINAL STENOSIS Plan for Procedure(s): LEFT SIDED LUMBAR 4-5 TRANSFORAMINAL LUMBAR INTERBODY FUSION WITH INSTRUMENTATION AND ALLOGRAFT   Jackelyn Hoehn, MD 03/28/2018 6:27 AM

## 2018-03-28 NOTE — Transfer of Care (Signed)
Immediate Anesthesia Transfer of Care Note  Patient: Jermaine Stewart  Procedure(s) Performed: LEFT SIDED LUMBAR FOUR-FIVE TRANSFORAMINAL LUMBAR INTERBODY FUSION WITH INSTRUMENTATION AND ALLOGRAFT (Left )  Patient Location: PACU  Anesthesia Type:General  Level of Consciousness: awake, alert  and oriented  Airway & Oxygen Therapy: Patient Spontanous Breathing and Patient connected to nasal cannula oxygen  Post-op Assessment: Report given to RN and Post -op Vital signs reviewed and stable  Post vital signs: Reviewed and stable  Last Vitals:  Vitals Value Taken Time  BP 124/81 03/28/2018 11:21 AM  Temp    Pulse 103 03/28/2018 11:26 AM  Resp 30 03/28/2018 11:25 AM  SpO2 97 % 03/28/2018 11:26 AM  Vitals shown include unvalidated device data.  Last Pain:  Vitals:   03/28/18 0604  TempSrc:   PainSc: 5          Complications: No apparent anesthesia complications

## 2018-03-28 NOTE — Progress Notes (Signed)
Orthopedic Tech Progress Note Patient Details:  Jermaine Stewart 17-Sep-1960 [id number] Placed order with BIO Tech          Glendene Wyer Danella Penton 03/28/2018, 1:31 PM

## 2018-03-28 NOTE — Anesthesia Postprocedure Evaluation (Signed)
Anesthesia Post Note  Patient: Jermaine Stewart  Procedure(s) Performed: LEFT SIDED LUMBAR FOUR-FIVE TRANSFORAMINAL LUMBAR INTERBODY FUSION WITH INSTRUMENTATION AND ALLOGRAFT (Left )     Patient location during evaluation: PACU Anesthesia Type: General Level of consciousness: awake and alert Pain management: pain level controlled Vital Signs Assessment: post-procedure vital signs reviewed and stable Respiratory status: spontaneous breathing, nonlabored ventilation and respiratory function stable Cardiovascular status: blood pressure returned to baseline and stable Postop Assessment: no apparent nausea or vomiting Anesthetic complications: no    Last Vitals:  Vitals:   03/28/18 1221 03/28/18 1246  BP: 112/79 137/85  Pulse: 96 96  Resp: 20 18  Temp: 36.6 C 36.7 C  SpO2: 93% 98%    Last Pain:  Vitals:   03/28/18 1246  TempSrc: Oral  PainSc:     LLE Motor Response: (P) Purposeful movement (03/28/18 1245) LLE Sensation: (P) Full sensation (03/28/18 1245) RLE Motor Response: (P) Purposeful movement (03/28/18 1245) RLE Sensation: (P) Full sensation (03/28/18 1245)      Lucretia Kern

## 2018-03-28 NOTE — Anesthesia Procedure Notes (Signed)
Procedure Name: Intubation Date/Time: 03/28/2018 7:39 AM Performed by: Mayer Camel, CRNA Pre-anesthesia Checklist: Patient identified, Emergency Drugs available, Suction available and Patient being monitored Patient Re-evaluated:Patient Re-evaluated prior to induction Oxygen Delivery Method: Circle System Utilized Preoxygenation: Pre-oxygenation with 100% oxygen Induction Type: IV induction Ventilation: Mask ventilation without difficulty Laryngoscope Size: Miller and 3 Grade View: Grade I Tube type: Oral Tube size: 7.5 mm Number of attempts: 1 Airway Equipment and Method: Stylet and Oral airway Placement Confirmation: ETT inserted through vocal cords under direct vision,  positive ETCO2 and breath sounds checked- equal and bilateral Secured at: 22 cm Tube secured with: Tape Dental Injury: Teeth and Oropharynx as per pre-operative assessment

## 2018-03-28 NOTE — Op Note (Signed)
PATIENT NAME: Jermaine Stewart   MEDICAL RECORD NO.:   [id number]    PHYSICIAN:  Estill Bamberg, MD      DATE OF BIRTH: 02/03/1961   DATE OF PROCEDURE: 03/28/2018                               OPERATIVE REPORT     PREOPERATIVE DIAGNOSES: 1. left lumbar radiculopathy. 2. L4-5 spinal stenosis. 3. Severe L4-5 degenerative disc disease 4. High grade 1 spondylolisthesis   POSTOPERATIVE DIAGNOSES: 1. left lumbar radiculopathy. 2. L4-5 spinal stenosis. 3. Severe L4-5 degenerative disc disease 4. High grade 1 spondylolisthesis   PROCEDURES: 1. L4/5 decompression 2. left-sided L4-5 transforaminal lumbar interbody fusion. 3. right-sided L4-5 posterolateral fusion. 4. Insertion of interbody device x1 (40mm Concorde intervertebral spacer). 5. Placement of segmental posterior instrumentation L4, L5 bilaterally  6. Use of local autograft. 7. Use of morselized allograft - Vivigen 8. Intraoperative use of fluoroscopy.   SURGEON:  Estill Bamberg, MD.   ASSISTANTJason Coop, PA-C.   ANESTHESIA:  General endotracheal anesthesia.   COMPLICATIONS:  None.   DISPOSITION:  Stable.   ESTIMATED BLOOD LOSS:  100cc   INDICATIONS FOR SURGERY:  Briefly, Jermaine Stewart is a pleasant 58 year old male  who did present to me with severe and ongoing pain in the left legs. I did feel that the symptoms were secondary to the findings noted above.   The patient failed conservative care and did wish to proceed with the procedure  noted above.   OPERATIVE DETAILS:  On 03/28/2018 , the patient was brought to surgery and general endotracheal anesthesia was administered.  The patient was placed prone on a well-padded flat Jackson bed with a spinal frame.  Antibiotics were given and a time-out procedure was performed. The back was prepped and draped in the usual fashion.  A midline incision was made overlying the L4-5 intervertebral spaces.  The fascia was incised at the midline.  The paraspinal musculature  was bluntly swept laterally.  Anatomic landmarks for the pedicles were exposed. Using fluoroscopy, I did cannulate the L4 and L5 pedicles bilaterally, using a medial to lateral cortical trajectory technique.  At this point, 7 x 30 mm screws were placed into the right pedicles, and a 40 mm rod was placed into the tulip heads of the screw, and caps were also placed.  Distraction was then applied across the L4-5 intervertebral space, and the caps were then provisionally tightened.  On the left side, bone wax was placed into the cannulated pedicle holes.  I then proceeded with the decompressive aspect of the procedure at the L4-5 level.  A partial facetectomy was performed bilaterally at L4-5, decompressing the L4-5 intervertebral space and left foramen. The L4 nerve compression was noted to be very significant. The L4 nerve was very compressed, but the compression was thoroughly an d completely removed. I was very pleased with the decompression. With the assistant holding medial retraction of the traversing left L5 nerve, I did perform an annulotomy at the posterolateral aspect of the L4-5 intervertebral space.  I then used a series of curettes and pituitary rongeurs to perform a thorough and complete intervertebral diskectomy.  The intervertebral space was then liberally packed with autograft as well as allograft in the form of Vivigen, as was the appropriate-sized intervertebral spacer (10 mm, parallel).  The spacer was then tamped into position in the usual fashion.  I was very pleased with  the press-fit of the spacer.  I then placed 6mm and 7 mm screws on the left at L4 and L5. A 35-mm rod was then placed and caps were placed. The distraction was then released on the contralateral side.  All caps were then locked.  The wound was copiously irrigated with a total of approximately 3 L prior to placing the bone graft.  Additional autograft and allograft was then packed into the posterolateral gutter on  the right side to help aid in the success of the fusion.  The wound was  explored for any undue bleeding and there was no substantial bleeding encountered.  Gel-Foam was placed over the laminectomy site.  The wound was then closed in layers using #1 Vicryl followed by 2-0 Vicryl, followed by 4-0 Monocryl.  Benzoin and Steri-Strips were applied followed by sterile dressing.    Of note, did use triggered EMG to test the screws on the left, and there is no screw the tested below 15 mA. There was no sustained abnormal EMG activity noted throughout the surgery.   Of note, Jason CoopKayla McKenzie was my assistant throughout surgery, and did aid in retraction, suctioning, and closure.       Estill BambergMark Mikeria Valin, MD

## 2018-03-29 ENCOUNTER — Encounter (HOSPITAL_COMMUNITY): Payer: Self-pay | Admitting: Orthopedic Surgery

## 2018-03-29 NOTE — Evaluation (Signed)
Occupational Therapy Evaluation Patient Details Name: Jermaine Stewart MRN: [id number] DOB: 15-Dec-1960 Today's Date: 03/29/2018    History of Present Illness 58 yo male s/p L4-5 decompression fusion PMH  has a past medical history of Arthritis, GERD (gastroesophageal reflux disease), Hyperlipidemia, and Hypertension.    Clinical Impression   Patient evaluated by Occupational Therapy with no further acute OT needs identified. All education has been completed and the patient has no further questions. See below for any follow-up Occupational Therapy or equipment needs. OT to sign off. Thank you for referral.      Follow Up Recommendations  No OT follow up    Equipment Recommendations  None recommended by OT    Recommendations for Other Services       Precautions / Restrictions Precautions Precautions: Back Precaution Comments: handout provided adn reviewed for back precautions Required Braces or Orthoses: Spinal Brace Spinal Brace: Thoracolumbosacral orthotic;Applied in sitting position      Mobility Bed Mobility               General bed mobility comments: oob on arrival  Transfers Overall transfer level: Modified independent                    Balance                                           ADL either performed or assessed with clinical judgement   ADL Overall ADL's : Modified independent                                       General ADL Comments: dressed on arrival. able to don brace   Back handout provided and reviewed adls in detail. Pt educated on: clothing between brace, never sleep in brace, set an alarm at night for medication, avoid sitting for long periods of time, correct bed positioning for sleeping, correct sequence for bed mobility, avoiding lifting more than 5 pounds and never wash directly over incision. All education is complete and patient indicates understanding.     Vision Baseline  Vision/History: No visual deficits       Perception     Praxis      Pertinent Vitals/Pain Pain Assessment: No/denies pain     Hand Dominance Right   Extremity/Trunk Assessment Upper Extremity Assessment Upper Extremity Assessment: Overall WFL for tasks assessed           Communication Communication Communication: No difficulties   Cognition Arousal/Alertness: Awake/alert Behavior During Therapy: WFL for tasks assessed/performed Overall Cognitive Status: Within Functional Limits for tasks assessed                                     General Comments       Exercises     Shoulder Instructions      Home Living Family/patient expects to be discharged to:: Private residence Living Arrangements: Spouse/significant other;Children Available Help at Discharge: Family Type of Home: House Home Access: Stairs to enter Secretary/administrator of Steps: 2 Entrance Stairs-Rails: None Home Layout: One level     Bathroom Shower/Tub: Producer, television/film/video: Standard     Home Equipment: Environmental consultant - 2 wheels;Bedside commode;Adaptive equipment Adaptive Equipment:  Long-handled shoe horn Additional Comments: wife can provide help. has an elderly maltese dog      Prior Functioning/Environment Level of Independence: Independent                 OT Problem List:        OT Treatment/Interventions:      OT Goals(Current goals can be found in the care plan section) Acute Rehab OT Goals Patient Stated Goal: ready to go home  OT Frequency:     Barriers to D/C:            Co-evaluation              AM-PAC OT "6 Clicks" Daily Activity     Outcome Measure Help from another person eating meals?: None Help from another person taking care of personal grooming?: None Help from another person toileting, which includes using toliet, bedpan, or urinal?: None Help from another person bathing (including washing, rinsing, drying)?: None Help from  another person to put on and taking off regular upper body clothing?: None Help from another person to put on and taking off regular lower body clothing?: None 6 Click Score: 24   End of Session Nurse Communication: Mobility status;Precautions  Activity Tolerance: Patient tolerated treatment well Patient left: Other (comment)(with PT Judeth CornfieldStephanie)  OT Visit Diagnosis: Unsteadiness on feet (R26.81)                Time: [id number] OT Time Calculation (min): 14 min Charges:  OT General Charges $OT Visit: 1 Visit OT Evaluation $OT Eval Low Complexity: 1 Low   Mateo FlowBrynn Nicandro Perrault, OTR/L  Acute Rehabilitation Services Pager: [phone] Office: [phone] .   Mateo FlowBrynn Lyah Millirons 03/29/2018, 8:16 AM

## 2018-03-29 NOTE — Evaluation (Addendum)
Physical Therapy Evaluation Patient Details Name: SEM SORRELLS MRN: [id number] DOB: [birth year] Today's Date: 03/29/2018   History of Present Illness  58 yo male s/p L4-5 decompression fusion; has a past medical history of Arthritis, GERD (gastroesophageal reflux disease), Hyperlipidemia, and Hypertension.    Clinical Impression  Patient s/p above procedure. Patient up and mobilizing upon PT arrival. Patient ambulating without assist, able to navigate 10 steps with single handrail without issue. Education on back precautions, lifting restrictions and log rolling technique with good carryover. No further acute PT needs identified. PT to sign off.     Follow Up Recommendations No PT follow up    Equipment Recommendations  None recommended by PT    Recommendations for Other Services       Precautions / Restrictions Precautions Precautions: Back Precaution Comments: review back precautions and no lifting  Required Braces or Orthoses: Spinal Brace Spinal Brace: Thoracolumbosacral orthotic;Applied in sitting position Restrictions Weight Bearing Restrictions: No      Mobility  Bed Mobility               General bed mobility comments: oob on arrival  Transfers Overall transfer level: Modified independent                  Ambulation/Gait Ambulation/Gait assistance: Modified independent (Device/Increase time) Gait Distance (Feet): 300 Feet Assistive device: None Gait Pattern/deviations: WFL(Within Functional Limits);Step-through pattern Gait velocity: WNL   General Gait Details: good stability; no LOB  Stairs Stairs: Yes Stairs assistance: Supervision Stair Management: One rail Left;Alternating pattern;Forwards Number of Stairs: 10    Wheelchair Mobility    Modified Rankin (Stroke Patients Only)       Balance Overall balance assessment: No apparent balance deficits (not formally assessed)                                            Pertinent Vitals/Pain Pain Assessment: 0-10 Pain Score: 2  Pain Location: back Pain Descriptors / Indicators: Sore Pain Intervention(s): Limited activity within patient's tolerance;Monitored during session;Repositioned    Home Living Family/patient expects to be discharged to:: Private residence Living Arrangements: Spouse/significant other;Children Available Help at Discharge: Family Type of Home: House Home Access: Stairs to enter Entrance Stairs-Rails: None Entrance Stairs-Number of Steps: 2 Home Layout: One level Home Equipment: Environmental consultant - 2 wheels;Bedside commode;Adaptive equipment Additional Comments: wife can provide help. has an elderly maltese dog    Prior Function Level of Independence: Independent               Hand Dominance   Dominant Hand: Right    Extremity/Trunk Assessment   Upper Extremity Assessment Upper Extremity Assessment: Overall WFL for tasks assessed    Lower Extremity Assessment Lower Extremity Assessment: Overall WFL for tasks assessed    Cervical / Trunk Assessment Cervical / Trunk Assessment: Normal  Communication   Communication: No difficulties  Cognition Arousal/Alertness: Awake/alert Behavior During Therapy: WFL for tasks assessed/performed Overall Cognitive Status: Within Functional Limits for tasks assessed                                        General Comments General comments (skin integrity, edema, etc.): wife present and supportive    Exercises     Assessment/Plan    PT Assessment Patent does not need  any further PT services  PT Problem List         PT Treatment Interventions      PT Goals (Current goals can be found in the Care Plan section)  Acute Rehab PT Goals Patient Stated Goal: ready to go home PT Goal Formulation: With patient Time For Goal Achievement: 04/05/18 Potential to Achieve Goals: Good    Frequency     Barriers to discharge        Co-evaluation                AM-PAC PT "6 Clicks" Mobility  Outcome Measure Help needed turning from your back to your side while in a flat bed without using bedrails?: None Help needed moving from lying on your back to sitting on the side of a flat bed without using bedrails?: None Help needed moving to and from a bed to a chair (including a wheelchair)?: A Little Help needed standing up from a chair using your arms (e.g., wheelchair or bedside chair)?: A Little Help needed to walk in hospital room?: A Little Help needed climbing 3-5 steps with a railing? : A Little 6 Click Score: 20    End of Session Equipment Utilized During Treatment: Gait belt Activity Tolerance: Patient tolerated treatment well Patient left: in chair;with call bell/phone within reach;with family/visitor present Nurse Communication: Mobility status PT Visit Diagnosis: Unsteadiness on feet (R26.81)    Time: 4967-5916 PT Time Calculation (min) (ACUTE ONLY): 11 min   Charges:   PT Evaluation $PT Eval Low Complexity: 1 Low           Kipp Laurence, PT, DPT Supplemental Physical Therapist 03/29/18 8:27 AM Pager: [phone] Office: [phone]

## 2018-03-29 NOTE — Progress Notes (Signed)
Patient is discharged from room 3C03 at this time. Alert and in stable condition. IV site d/c'[d and instructions read to patient and spouse with understanding verbalized. Left unit via wheelchair with all belongings at side. 

## 2018-03-29 NOTE — Progress Notes (Signed)
    Patient doing well  Patient reports resolution of his preoperative leg pain Has been ambulating   Physical Exam: Vitals:   03/28/18 2303 03/29/18 0407  BP: (!) 138/94 (!) 144/93  Pulse: 83 80  Resp: 20 20  Temp: 97.9 F (36.6 C) 97.6 F (36.4 C)  SpO2: 98% 99%    Dressing in place NVI  POD #1 s/p L4/5 decompression and fusion, doing very well  - up with PT/OT, encourage ambulation - Percocet for pain, Valium for muscle spasms - d/c home today with f/u in 2 weeks

## 2018-04-02 MED FILL — Heparin Sodium (Porcine) Inj 1000 Unit/ML: INTRAMUSCULAR | Qty: 30 | Status: AC

## 2018-04-02 MED FILL — Sodium Chloride IV Soln 0.9%: INTRAVENOUS | Qty: 1000 | Status: AC

## 2018-04-15 ENCOUNTER — Other Ambulatory Visit: Payer: Self-pay | Admitting: Internal Medicine

## 2018-04-15 MED ORDER — FUROSEMIDE 40 MG PO TABS: ORAL_TABLET | ORAL | 0 refills | Status: DC

## 2018-04-17 NOTE — Discharge Summary (Signed)
Patient ID: Jermaine Stewart MRN: [id number] DOB/AGE: [birth year] 58 y.o.  Admit date: 03/28/2018 Discharge date: 03/29/2018  Admission Diagnoses:  Active Problems:   Radiculopathy   Discharge Diagnoses:  Same  Past Medical History:  Diagnosis Date  . Arthritis   . GERD (gastroesophageal reflux disease)   . Hyperlipidemia   . Hypertension     Surgeries: Procedure(s): LEFT SIDED LUMBAR FOUR-FIVE TRANSFORAMINAL LUMBAR INTERBODY FUSION WITH INSTRUMENTATION AND ALLOGRAFT on 03/28/2018   Consultants: None  Discharged Condition: Improved  Hospital Course: Jermaine Stewart is an 58 y.o. male who was admitted 03/28/2018 for operative treatment of radiculopathy. Patient has severe unremitting pain that affects sleep, daily activities, and work/hobbies. After pre-op clearance the patient was taken to the operating room on 03/28/2018 and underwent  Procedure(s): LEFT SIDED LUMBAR FOUR-FIVE TRANSFORAMINAL LUMBAR INTERBODY FUSION WITH INSTRUMENTATION AND ALLOGRAFT.    Patient was given perioperative antibiotics:  Anti-infectives (From admission, onward)   Start     Dose/Rate Route Frequency Ordered Stop   03/28/18 1600  ceFAZolin (ANCEF) IVPB 2g/100 mL premix     2 g 200 mL/hr over 30 Minutes Intravenous Every 8 hours 03/28/18 1255 03/29/18 0825   03/28/18 0725  bacitracin 50,000 Units in sodium chloride 0.9 % 500 mL irrigation  Status:  Discontinued       As needed 03/28/18 0901 03/28/18 1117   03/28/18 0600  ceFAZolin (ANCEF) IVPB 2g/100 mL premix     2 g 200 mL/hr over 30 Minutes Intravenous On call to O.R. 03/28/18 2481 03/28/18 8590       Patient was given sequential compression devices, early ambulation to prevent DVT.  Patient benefited maximally from hospital stay and there were no complications.    Recent vital signs: BP 136/89 (BP Location: Right Arm)   Pulse 83   Temp 97.6 F (36.4 C) (Oral)   Resp 16   Ht 5\' 9"  (1.753 m)   Wt 79.4 kg   SpO2 100%   BMI 25.84 kg/m     Discharge Medications:   Allergies as of 03/29/2018      Reactions   Atorvastatin Other (See Comments)   Arthralgias      Medication List    TAKE these medications   diazepam 5 MG tablet Commonly known as:  VALIUM Take 1 tablet (5 mg total) by mouth every 6 (six) hours as needed for muscle spasms.   ezetimibe 10 MG tablet Commonly known as:  ZETIA TAKE 1 TABLET DAILY   ferrous sulfate 325 (65 FE) MG EC tablet Take 325 mg by mouth daily with breakfast.   fluticasone 50 MCG/ACT nasal spray Commonly known as:  FLONASE Place 2 sprays into both nostrils at bedtime.   gabapentin 600 MG tablet Commonly known as:  NEURONTIN TAKE 1/2 TO 1 TABLET BY MOUTH 3 TO 4 TIMES PER DAY AS NEEDED FOR PAIN   lisinopril-hydrochlorothiazide 20-25 MG tablet Commonly known as:  PRINZIDE,ZESTORETIC TAKE 1 TABLET DAILY FOR BLOOD PRESSURE What changed:  additional instructions   omeprazole 40 MG capsule Commonly known as:  PRILOSEC TAKE 1 CAPSULE BY MOUTH TWICE DAILY What changed:  when to take this   oxyCODONE-acetaminophen 5-325 MG tablet Commonly known as:  PERCOCET/ROXICET Take 1 tablet by mouth every 4 (four) hours as needed for moderate pain or severe pain.   Vitamin D-3 125 MCG (5000 UT) Tabs Take 15,000 Units by mouth daily.       Diagnostic Studies: Dg Lumbar Spine 2-3 Views  Result  Date: 03/28/2018 CLINICAL DATA:  Lumbar fusion. EXAM: DG C-ARM 61-120 MIN; LUMBAR SPINE - 2-3 VIEW COMPARISON:  MRI 06/04/2017. FINDINGS: Lumbar spine numbered as per prior MRI. L4-L5 posterior fusion. Hardware intact. Stable L4 on L5 anterolisthesis. No acute bony abnormality. IMPRESSION: L4-L5 posterior fusion. Hardware intact. Stable L4 on L5 anterolisthesis. No acute bony abnormality. Electronically Signed   By: Maisie Fus  Register   On: 03/28/2018 11:03   Dg Lumbar Spine 1 View  Result Date: 03/28/2018 CLINICAL DATA:  Elective lumbar surgery. EXAM: LUMBAR SPINE - 1 VIEW COMPARISON:  MRI of June 04, 2017. FINDINGS: Single intraoperative cross-table lateral projection of the cervical spine demonstrates surgical probe directed toward posterior spinous process of L4. IMPRESSION: Surgical localization as described above. Electronically Signed   By: Lupita Raider, M.D.   On: 03/28/2018 10:08   Dg C-arm 1-60 Min  Result Date: 03/28/2018 CLINICAL DATA:  Lumbar fusion. EXAM: DG C-ARM 61-120 MIN; LUMBAR SPINE - 2-3 VIEW COMPARISON:  MRI 06/04/2017. FINDINGS: Lumbar spine numbered as per prior MRI. L4-L5 posterior fusion. Hardware intact. Stable L4 on L5 anterolisthesis. No acute bony abnormality. IMPRESSION: L4-L5 posterior fusion. Hardware intact. Stable L4 on L5 anterolisthesis. No acute bony abnormality. Electronically Signed   By: Maisie Fus  Register   On: 03/28/2018 11:03    Disposition:    POD #1 s/p L4/5 decompression and fusion, doing very well  - up with PT/OT, encourage ambulation - Percocet for pain, Valium for muscle spasms - scripts for pain sent to pharmacy electronically  -D/C instructions sheet printed and in chart -D/C today  -F/U in office 2 weeks   Signed: Eilene Ghazi Courage Biglow 04/17/2018, 11:21 AM

## 2018-04-19 DIAGNOSIS — K219 Gastro-esophageal reflux disease without esophagitis: Secondary | ICD-10-CM | POA: Insufficient documentation

## 2018-04-19 NOTE — Progress Notes (Signed)
FOLLOW UP  Assessment and Plan:   Hypertension Well controlled with current medications  He has ongoing tachycardia; after discussion will stop lisinopril/hctz, start bisoprolol/hctz 5-6.25 mg Monitor blood pressure and pulse at home; patient to call if consistently greater than 130/80 Continue DASH diet.   Reminder to go to the ER if any CP, SOB, nausea, dizziness, severe HA, changes vision/speech, left arm numbness and tingling and jaw pain.  Cholesterol Currently above goal; on zetia 10 mg, rosuvastatin 20 mg twice weekly (hx of myalgias on high dose statin), fenofibrate Continue low cholesterol diet and exercise.  Check lipid panel.   Other abnormal glucose Recent A1Cs  well controlled Continue diet and exercise.  Perform daily foot/skin check, notify office of any concerning changes.  Defer A1C; check annually; check CMP today  Vitamin D Def Below goal at recent check; he has not increased dose, wasn't taking consistently at last visit but has restarted continue to recommend supplementation for goal of 60-100 Defer vitamin D level  GERD Symptoms well managed without breakthrough Will try to get off PPI given info for taper and famotidine sent in  Edema - elevate legs TID, increase activity, increase water, decrease sodium intake.  Wear compression socks more routinely if available. Return to the office if no change with symptoms. Check labs. - Continue with lasix 40 mg for now - taper down if edema resolves after removal of brace.   Continue diet and meds as discussed. Further disposition pending results of labs. Discussed med's effects and SE's.   Over 30 minutes of exam, counseling, chart review, and critical decision making was performed.   Future Appointments  Date Time Provider Department Center  07/31/2018  3:00 PM Lucky Cowboy, MD [facility] None     ----------------------------------------------------------------------------------------------------------------------  HPI 58 y.o. male  presents for 3 month follow up on hypertension, cholesterol, glucose management, weight and vitamin D deficiency.   He is following with Dr. Yevette Edwards for L4-5 left radicular pain, followed by Dr. Yevette Edwards and recently underwent L sided L4-5 fusion and reports he is doing well, gradually increasing exercise. Doing well with gabapentin only, hasn't needed percocet since discharge. He has follow up scheduled 05/02/2018.   he has a diagnosis of GERD which is currently managed by omeprazole 40 mg daily since 10/2017 for ulcer attributed to NSAID/steroid use.  he reports symptoms is currently well controlled, and denies breakthrough reflux, burning in chest, hoarseness or cough.    BMI is Body mass index is 27.17 kg/m., he has not been working on weight due to surgery.  Wt Readings from Last 3 Encounters:  04/22/18 184 lb (83.5 kg)  03/28/18 175 lb (79.4 kg)  03/22/18 176 lb 1 oz (79.9 kg)   His blood pressure has been controlled at home, today their BP is BP: 120/64 he was temporarily initiated on lasix 40 mg daily due to reports of persistent LE edema following recent surgery, edema improved with lasix but not resolved.   He does workout. He denies chest pain, shortness of breath, dizziness, PND, fatigue, palpitations.    He is on cholesterol medication (zetia 10 mg daily, rosuvastatin 20 mg twice a week, fenofibrate 145 mg daily) and denies myalgias. His LDL cholesterol is not at goal; trigs were slightly elevated. The cholesterol last visit was:   Lab Results  Component Value Date   CHOL 247 (H) 01/15/2018   HDL 63 01/15/2018   LDLCALC 146 (H) 01/15/2018   TRIG 235 (H) 01/15/2018   CHOLHDL 3.9 01/15/2018  He has been working on diet and exercise for glucose management, and denies increased appetite, nausea, paresthesia of the feet, polydipsia,  polyuria and visual disturbances. Last A1C in the office was:  Lab Results  Component Value Date   HGBA1C 5.4 01/15/2018   Patient is on vitamin D supplement and below goal at the last check:    Lab Results  Component Value Date   VD25OH 41 01/15/2018       Current Medications:  Current Outpatient Medications on File Prior to Visit  Medication Sig  . Cholecalciferol (VITAMIN D-3) 5000 UNITS TABS Take 15,000 Units by mouth daily.   . diazepam (VALIUM) 5 MG tablet Take 1 tablet (5 mg total) by mouth every 6 (six) hours as needed for muscle spasms.  Marland Kitchen ezetimibe (ZETIA) 10 MG tablet TAKE 1 TABLET DAILY (Patient taking differently: Take 10 mg by mouth daily. )  . ferrous sulfate 325 (65 FE) MG EC tablet Take 325 mg by mouth daily with breakfast.  . fluticasone (FLONASE) 50 MCG/ACT nasal spray Place 2 sprays into both nostrils at bedtime.  . furosemide (LASIX) 40 MG tablet Take 1 tablet daily if needed for Fluid Retention  . gabapentin (NEURONTIN) 600 MG tablet TAKE 1/2 TO 1 TABLET BY MOUTH 3 TO 4 TIMES PER DAY AS NEEDED FOR PAIN  . lisinopril-hydrochlorothiazide (PRINZIDE,ZESTORETIC) 20-25 MG tablet TAKE 1 TABLET DAILY FOR BLOOD PRESSURE (Patient taking differently: Take 1 tablet by mouth daily. )  . omeprazole (PRILOSEC) 40 MG capsule TAKE 1 CAPSULE BY MOUTH TWICE DAILY (Patient taking differently: Take 40 mg by mouth daily. )   No current facility-administered medications on file prior to visit.      Allergies:  Allergies  Allergen Reactions  . Atorvastatin Other (See Comments)    Arthralgias     Medical History:  Past Medical History:  Diagnosis Date  . Arthritis   . GERD (gastroesophageal reflux disease)   . Hyperlipidemia   . Hypertension    Family history- Reviewed and unchanged Social history- Reviewed and unchanged   Review of Systems:  Review of Systems  Constitutional: Negative for malaise/fatigue and weight loss.  HENT: Negative for hearing loss and tinnitus.    Eyes: Negative for blurred vision and double vision.  Respiratory: Negative for cough, shortness of breath and wheezing.   Cardiovascular: Positive for leg swelling. Negative for chest pain, palpitations, orthopnea, claudication and PND.  Gastrointestinal: Negative for abdominal pain, blood in stool, constipation, diarrhea, heartburn, melena, nausea and vomiting.  Genitourinary: Negative.   Musculoskeletal: Negative for back pain, joint pain and myalgias.  Skin: Negative for rash.  Neurological: Negative for dizziness, tingling, sensory change, weakness and headaches.  Endo/Heme/Allergies: Negative for polydipsia.  Psychiatric/Behavioral: Negative.   All other systems reviewed and are negative.    Physical Exam: BP 120/64   Pulse (!) 113   Temp 97.7 F (36.5 C)   Ht  (1.753 m)   Wt 184 lb (83.5 kg)   SpO2 96%   BMI 27.17 kg/m  Wt Readings from Last 3 Encounters:  04/22/18 184 lb (83.5 kg)  03/28/18 175 lb (79.4 kg)  03/22/18 176 lb 1 oz (79.9 kg)   General Appearance: Well nourished, in no apparent distress. Eyes: PERRLA, EOMs, conjunctiva no swelling or erythema Sinuses: No Frontal/maxillary tenderness ENT/Mouth: Ext aud canals clear, TMs without erythema, bulging. No erythema, swelling, or exudate on post pharynx.  Tonsils not swollen or erythematous. Hearing normal.  Neck: Supple, thyroid normal.  Respiratory: Respiratory  effort normal, BS equal bilaterally without rales, rhonchi, wheezing or stridor.  Cardio: RRR with no MRGs. Brisk peripheral pulses with 1+ pitting edema of lower legs, pedal area.  Abdomen: Soft, + BS.  Non tender, no guarding, rebound, hernias, masses. Lymphatics: Non tender without lymphadenopathy.  Musculoskeletal: Full peripheral ROM, 5/5 strength, Normal gait. Wearing lumbar brace.  Skin: Warm, dry without rashes, lesions, ecchymosis.  Neuro: Cranial nerves intact. No cerebellar symptoms.  Psych: Awake and oriented X 3, normal affect, Insight  and Judgment appropriate.    Dan Maker, NP 4:34 PM [facility]

## 2018-04-22 ENCOUNTER — Encounter: Payer: Self-pay | Admitting: Adult Health

## 2018-04-22 ENCOUNTER — Ambulatory Visit (INDEPENDENT_AMBULATORY_CARE_PROVIDER_SITE_OTHER): Payer: Commercial Managed Care - PPO | Admitting: Adult Health

## 2018-04-22 VITALS — BP 120/64 | HR 113 | Temp 97.7°F | Ht 69.0 in | Wt 184.0 lb

## 2018-04-22 DIAGNOSIS — E782 Mixed hyperlipidemia: Secondary | ICD-10-CM

## 2018-04-22 DIAGNOSIS — Z79899 Other long term (current) drug therapy: Secondary | ICD-10-CM

## 2018-04-22 DIAGNOSIS — I10 Essential (primary) hypertension: Secondary | ICD-10-CM

## 2018-04-22 DIAGNOSIS — Z6825 Body mass index (BMI) 25.0-25.9, adult: Secondary | ICD-10-CM

## 2018-04-22 DIAGNOSIS — R600 Localized edema: Secondary | ICD-10-CM

## 2018-04-22 DIAGNOSIS — E559 Vitamin D deficiency, unspecified: Secondary | ICD-10-CM

## 2018-04-22 DIAGNOSIS — K219 Gastro-esophageal reflux disease without esophagitis: Secondary | ICD-10-CM

## 2018-04-22 DIAGNOSIS — R7309 Other abnormal glucose: Secondary | ICD-10-CM | POA: Diagnosis not present

## 2018-04-22 MED ORDER — BISOPROLOL-HYDROCHLOROTHIAZIDE 5-6.25 MG PO TABS: 1.0000 | ORAL_TABLET | Freq: Every day | ORAL | 1 refills | Status: DC

## 2018-04-22 MED ORDER — FUROSEMIDE 40 MG PO TABS: ORAL_TABLET | ORAL | 2 refills | Status: DC

## 2018-04-22 MED ORDER — FAMOTIDINE 20 MG PO TABS: 20.0000 mg | ORAL_TABLET | Freq: Two times a day (BID) | ORAL | 1 refills | Status: DC | PRN

## 2018-04-22 MED ORDER — FENOFIBRATE 145 MG PO TABS: 145.0000 mg | ORAL_TABLET | Freq: Every day | ORAL | 1 refills | Status: DC

## 2018-04-22 MED ORDER — ROSUVASTATIN CALCIUM 20 MG PO TABS: ORAL_TABLET | ORAL | 1 refills | Status: DC

## 2018-04-22 NOTE — Patient Instructions (Addendum)
Goals    . Blood Pressure < 130/80    . LDL CALC < 100       Stop lisinopril/hctz - start ziac (bisoprolol/hctz)    Continue lasix for now - if edema resolving, try cut back to 1/2 tab, then stop if possible   Continue cholesterol medications as currently taking -   Bisoprolol; Hydrochlorothiazide, HCTZ tablets What is this medicine? BISOPROLOL; HYDROCHLOROTHIAZIDE (bis OH proe lol; hye droe klor oh THYE a zide) is a combination of a beta-blocker and a diuretic. It is used to treat high blood pressure. This medicine may be used for other purposes; ask your health care provider or pharmacist if you have questions. COMMON BRAND NAME(S): Ziac What should I tell my health care provider before I take this medicine? They need to know if you have any of these conditions: -circulation problems, or blood vessel disease -decreased urine -diabetes -heart disease, heart failure or a history of heart attack -kidney disease -liver disease -lung or breathing disease, like asthma -slow heart rate -thyroid disease -an unusual or allergic reaction to hydrochlorothiazide, bisoprolol, sulfa drugs, other medicines, foods, dyes, or preservatives -pregnant or trying to get pregnant -breast-feeding How should I use this medicine? Take this medicine by mouth with a glass of water. Follow the directions on the prescription label. You can take it with or without food. If it upsets your stomach, take it with food. Take your medicine at regular intervals. Do not take it more often than directed. Do not stop taking except on your doctor's advice. Talk to your pediatrician regarding the use of this medicine in children. Special care may be needed. Overdosage: If you think you have taken too much of this medicine contact a poison control center or emergency room at once. NOTE: This medicine is only for you. Do not share this medicine with others. What if I miss a dose? If you miss a dose, take it as soon as  you can. If it is almost time for your next dose, take only that dose. Do not take double or extra doses. What may interact with this medicine? -barbiturates, like phenobarbital -cholestyramine -colestipol -corticosteroids, like prednisone -lithium -medicines for chest pain or angina -medicines for diabetes -medicines for high blood pressure or heart failure -medicines to control heart rhythm -NSAIDs, medicines for pain and inflammation, like ibuprofen or naproxen -prescription pain medicines -rifampin -skeletal muscle relaxants like tubocurarine This list may not describe all possible interactions. Give your health care provider a list of all the medicines, herbs, non-prescription drugs, or dietary supplements you use. Also tell them if you smoke, drink alcohol, or use illegal drugs. Some items may interact with your medicine. What should I watch for while using this medicine? Visit your doctor or health care professional for regular checks on your progress. Check your blood pressure as directed. Ask your doctor or health care professional what your blood pressure should be and when you should contact him or her. Check with your doctor or health care professional if you get an attack of severe diarrhea, nausea and vomiting, or if you sweat a lot. The loss of too much body fluid can make it dangerous for you to take this medicine. You may get drowsy or dizzy. Do not drive, use machinery, or do anything that needs mental alertness until you know how this drug affects you. Do not stand or sit up quickly, especially if you are an older patient. This reduces the risk of dizzy or fainting spells.  Alcohol can make you more drowsy and dizzy. Avoid alcoholic drinks. This medicine may affect your blood sugar level. If you have diabetes, check with your doctor or health care professional before changing the dose of your diabetic medicine. This medicine can make you more sensitive to the sun. Keep out of  the sun. If you cannot avoid being in the sun, wear protective clothing and use sunscreen. Do not use sun lamps or tanning beds/booths. Do not treat yourself for coughs, colds, or pain while you are taking this medicine without asking your doctor or health care professional for advice. Some ingredients may increase your blood pressure. What side effects may I notice from receiving this medicine? Side effects that you should report to your doctor or health care professional as soon as possible: -allergic reactions like skin rash, itching or hives, swelling of the face, lips, or tongue -breathing problems -changes in vision -chest pain -cold, tingling, or numb hands or feet -eye pain -fast, irregular, or slow heartbeat -increased thirst or sweating -muscle cramps -redness, blistering, peeling or loosening of the skin, including inside the mouth -swollen legs or ankles -tremors -unusual bruising -unusual weak or tired -vomiting -worsened gout pain -yellowing of the eyes or skin Side effects that usually do not require medical attention (report to your doctor or health care professional if they continue or are bothersome): -change in sex drive or performance -cough -depression -diarrhea -nausea This list may not describe all possible side effects. Call your doctor for medical advice about side effects. You may report side effects to FDA at 1-800-FDA-1088. Where should I keep my medicine? Keep out of the reach of children. Store at room temperature between 15 and 30 degrees C (59 and 86 degrees F). Keep container tightly closed. Throw away any unused medicine after the expiration date. NOTE: This sheet is a summary. It may not cover all possible information. If you have questions about this medicine, talk to your doctor, pharmacist, or health care provider.  2019 Elsevier/Gold Standard (2009-10-27 12:53:54)

## 2018-04-23 ENCOUNTER — Encounter: Payer: Self-pay | Admitting: Adult Health

## 2018-04-23 DIAGNOSIS — D649 Anemia, unspecified: Secondary | ICD-10-CM | POA: Insufficient documentation

## 2018-04-23 LAB — CBC WITH DIFFERENTIAL/PLATELET
Absolute Monocytes: 980 cells/uL — ABNORMAL HIGH (ref 200–950)
Basophils Absolute: 39 cells/uL (ref 0–200)
Basophils Relative: 0.7 %
Eosinophils Absolute: 78 cells/uL (ref 15–500)
Eosinophils Relative: 1.4 %
HCT: 35.6 % — ABNORMAL LOW (ref 38.5–50.0)
Hemoglobin: 11.9 g/dL — ABNORMAL LOW (ref 13.2–17.1)
Lymphs Abs: 1238 cells/uL (ref 850–3900)
MCH: 27.2 pg (ref 27.0–33.0)
MCHC: 33.4 g/dL (ref 32.0–36.0)
MCV: 81.5 fL (ref 80.0–100.0)
MONOS PCT: 17.5 %
MPV: 10.2 fL (ref 7.5–12.5)
Neutro Abs: 3265 cells/uL (ref 1500–7800)
Neutrophils Relative %: 58.3 %
Platelets: 309 10*3/uL (ref 140–400)
RBC: 4.37 10*6/uL (ref 4.20–5.80)
RDW: 15.3 % — ABNORMAL HIGH (ref 11.0–15.0)
TOTAL LYMPHOCYTE: 22.1 %
WBC: 5.6 10*3/uL (ref 3.8–10.8)

## 2018-04-23 LAB — URINALYSIS, ROUTINE W REFLEX MICROSCOPIC
Bacteria, UA: NONE SEEN /HPF
Bilirubin Urine: NEGATIVE
GLUCOSE, UA: NEGATIVE
Hgb urine dipstick: NEGATIVE
Hyaline Cast: NONE SEEN /LPF
Ketones, ur: NEGATIVE
Leukocytes,Ua: NEGATIVE
Nitrite: NEGATIVE
PH: 6.5 (ref 5.0–8.0)
Protein, ur: NEGATIVE
RBC / HPF: NONE SEEN /HPF (ref 0–2)
Specific Gravity, Urine: 1.013 (ref 1.001–1.03)

## 2018-04-23 LAB — LIPID PANEL
Cholesterol: 186 mg/dL (ref <200)
HDL: 64 mg/dL (ref >=40)
LDL Cholesterol (Calc): 93 mg/dL (calc)
Non-HDL Cholesterol (Calc): 122 mg/dL (calc) (ref <130)
Total CHOL/HDL Ratio: 2.9 (calc) (ref <5.0)
Triglycerides: 195 mg/dL — ABNORMAL HIGH (ref <150)

## 2018-04-23 LAB — COMPLETE METABOLIC PANEL WITH GFR
AG Ratio: 1.8 (calc) (ref 1.0–2.5)
ALBUMIN MSPROF: 4.4 g/dL (ref 3.6–5.1)
ALT: 23 U/L (ref 9–46)
AST: 31 U/L (ref 10–35)
Alkaline phosphatase (APISO): 59 U/L (ref 35–144)
BUN / CREAT RATIO: 9 (calc) (ref 6–22)
BUN: 13 mg/dL (ref 7–25)
CO2: 28 mmol/L (ref 20–32)
Calcium: 9.7 mg/dL (ref 8.6–10.3)
Chloride: 87 mmol/L — ABNORMAL LOW (ref 98–110)
Creat: 1.38 mg/dL — ABNORMAL HIGH (ref 0.70–1.33)
GFR, Est African American: 66 mL/min/{1.73_m2} (ref >=60)
GFR, Est Non African American: 57 mL/min/{1.73_m2} — ABNORMAL LOW (ref >=60)
GLOBULIN: 2.5 g/dL (ref 1.9–3.7)
Glucose, Bld: 103 mg/dL — ABNORMAL HIGH (ref 65–99)
Potassium: 3.5 mmol/L (ref 3.5–5.3)
Sodium: 127 mmol/L — ABNORMAL LOW (ref 135–146)
Total Bilirubin: 0.4 mg/dL (ref 0.2–1.2)
Total Protein: 6.9 g/dL (ref 6.1–8.1)

## 2018-04-23 LAB — TSH: TSH: 2.11 m[IU]/L (ref 0.40–4.50)

## 2018-04-23 LAB — MAGNESIUM: Magnesium: 1.9 mg/dL (ref 1.5–2.5)

## 2018-04-28 ENCOUNTER — Other Ambulatory Visit: Payer: Self-pay | Admitting: Internal Medicine

## 2018-04-28 MED ORDER — FUROSEMIDE 40 MG PO TABS: ORAL_TABLET | ORAL | 1 refills | Status: DC

## 2018-05-13 ENCOUNTER — Other Ambulatory Visit: Payer: Self-pay | Admitting: Physician Assistant

## 2018-05-26 ENCOUNTER — Other Ambulatory Visit: Payer: Self-pay | Admitting: Internal Medicine

## 2018-05-26 DIAGNOSIS — M5442 Lumbago with sciatica, left side: Principal | ICD-10-CM

## 2018-05-26 DIAGNOSIS — G8929 Other chronic pain: Secondary | ICD-10-CM

## 2018-05-26 MED ORDER — OMEPRAZOLE 40 MG PO CPDR: DELAYED_RELEASE_CAPSULE | ORAL | 0 refills | Status: DC

## 2018-05-26 MED ORDER — GABAPENTIN 600 MG PO TABS: ORAL_TABLET | ORAL | 0 refills | Status: DC

## 2018-05-26 MED ORDER — BISOPROLOL-HYDROCHLOROTHIAZIDE 5-6.25 MG PO TABS: ORAL_TABLET | ORAL | 1 refills | Status: DC

## 2018-06-15 ENCOUNTER — Other Ambulatory Visit: Payer: Self-pay | Admitting: Physician Assistant

## 2018-06-21 ENCOUNTER — Other Ambulatory Visit: Payer: Self-pay | Admitting: Orthopedic Surgery

## 2018-06-21 ENCOUNTER — Other Ambulatory Visit (HOSPITAL_COMMUNITY): Payer: Self-pay | Admitting: Orthopedic Surgery

## 2018-06-21 DIAGNOSIS — M25562 Pain in left knee: Secondary | ICD-10-CM

## 2018-07-07 ENCOUNTER — Other Ambulatory Visit: Payer: Self-pay | Admitting: Internal Medicine

## 2018-07-07 MED ORDER — FLUTICASONE PROPIONATE 50 MCG/ACT NA SUSP: NASAL | 3 refills | Status: DC

## 2018-07-08 ENCOUNTER — Other Ambulatory Visit: Payer: Self-pay | Admitting: Internal Medicine

## 2018-07-08 MED ORDER — ATENOLOL 100 MG PO TABS: ORAL_TABLET | ORAL | 3 refills | Status: DC

## 2018-07-23 ENCOUNTER — Encounter (HOSPITAL_COMMUNITY)
Admission: RE | Admit: 2018-07-23 | Discharge: 2018-07-23 | Disposition: A | Discharge status: Home / Self Care | Payer: Commercial Managed Care - PPO | Source: Ambulatory Visit | Attending: Orthopedic Surgery | Admitting: Orthopedic Surgery

## 2018-07-23 ENCOUNTER — Ambulatory Visit (HOSPITAL_COMMUNITY)
Admission: RE | Admit: 2018-07-23 | Discharge: 2018-07-23 | Disposition: A | Discharge status: Home / Self Care | Payer: Commercial Managed Care - PPO | Source: Ambulatory Visit | Attending: Orthopedic Surgery | Admitting: Orthopedic Surgery

## 2018-07-23 ENCOUNTER — Other Ambulatory Visit: Payer: Self-pay

## 2018-07-23 DIAGNOSIS — M25562 Pain in left knee: Secondary | ICD-10-CM | POA: Insufficient documentation

## 2018-07-23 MED ORDER — TECHNETIUM TC 99M MEDRONATE IV KIT
20.0000 | PACK | Freq: Once | INTRAVENOUS | Status: AC | PRN
  Administered 2018-07-23: 20 via INTRAVENOUS

## 2018-07-30 ENCOUNTER — Other Ambulatory Visit: Payer: Self-pay | Admitting: Orthopedic Surgery

## 2018-07-30 ENCOUNTER — Encounter: Payer: Self-pay | Admitting: Internal Medicine

## 2018-07-30 NOTE — Progress Notes (Signed)
[facility]  Lucky CowboyWilliam Aliyanna Wassmer, M.D.        Dyanne CarrelAmanda R. Steffanie Dunnollier, P.A.-C         Judd GaudierAshley Corbett, DNP [facility]                [address]                [place], [place] [id number] Telephone [phone] Telefax [phone] Annual  Screening/Preventative Visit  & Comprehensive Evaluation & Examination     This very nice 58 y.o. MWM presents for a Screening /Preventative Visit & comprehensive evaluation and management of multiple medical co-morbidities.  Patient has been followed for HTN, HLD, Prediabetes and Vitamin D Deficiency.     In Feb, patient was hospitalized for a Left L4/L5 Fusion by Dr Yevette Edwardsumonski.     HTN predates circa 2000. Patient's BP has been controlled at home.  Today's BP is at goal - 140/82. Patient denies any cardiac symptoms as chest pain, palpitations, shortness of breath, dizziness or ankle swelling.     Patient's hyperlipidemia is controlled with diet and Zetia, fenofibrate and low dose Crestor.  Patient denies myalgias or other medication SE's. Last lipids were at goal albeit sl elevated Trig's: Lab Results  Component Value Date   CHOL 186 04/22/2018   HDL 64 04/22/2018   LDLCALC 93 04/22/2018   TRIG 195 (H) 04/22/2018   CHOLHDL 2.9 04/22/2018      Patient has hx/o abn glucose & is monitored expectantly for glucose intolerance. Patient denies reactive hypoglycemic symptoms, visual blurring, diabetic polys or paresthesias. Last A1c was Normal & at goal: Lab Results  Component Value Date   HGBA1C 5.4 01/15/2018       Finally, patient has history of Vitamin D Deficiency ("25" / 2014)  and last vitamin D was low (goal 70-100): Lab Results  Component Value Date   VD25OH 41 01/15/2018   Current Outpatient Medications on File Prior to Visit  Medication Sig  . atenolol (TENORMIN) 100 MG tablet Take 1 tablet at Bedtime for BP  . Cholecalciferol (VITAMIN D-3) 5000 UNITS TABS Take 15,000 Units by  mouth daily.   . diazepam (VALIUM) 5 MG tablet Take 1 tablet (5 mg total) by mouth every 6 (six) hours as needed for muscle spasms.  Marland Kitchen. ezetimibe (ZETIA) 10 MG tablet TAKE 1 TABLET DAILY (Patient taking differently: Take 10 mg by mouth daily. )  . famotidine (PEPCID) 20 MG tablet Take 1 tablet (20 mg total) by mouth 2 (two) times daily as needed for heartburn or indigestion.  . ferrous sulfate 325 (65 FE) MG EC tablet Take 325 mg by mouth daily with breakfast.  . fluticasone (FLONASE) 50 MCG/ACT nasal spray Use 1 to Sprays each nares 1 to 2 x /day  . furosemide (LASIX) 40 MG tablet Take 1 tablet daily if needed for Fluid Retention  . omeprazole (PRILOSEC) 40 MG capsule Take 1 capsule 2 x /day for Acid Indigestion & Reflux   No current facility-administered medications on file prior to visit.    Allergies  Allergen Reactions  . Atorvastatin Other (See Comments)    Arthralgias   Past Medical History:  Diagnosis Date  . Arthritis   . GERD (gastroesophageal reflux disease)   . Hyperlipidemia   . Hypertension    Health Maintenance  Topic Date Due  . COLONOSCOPY  01/08/2013  . INFLUENZA VACCINE  09/21/2018  . TETANUS/TDAP  07/23/2023  . Hepatitis C Screening  Completed  . HIV Screening  Completed   Immunization History  Administered Date(s) Administered  . Influenza Inj Mdck Quad With Preservative 11/29/2017  . Influenza,inj,quad, With Preservative 12/21/2015  . Influenza-Unspecified 11/01/2016  . PPD Test 07/22/2013, 04/24/2016, 06/26/2017  . Tdap 07/22/2013   Last Colon -  Past Surgical History:  Procedure Laterality Date  . HERNIA REPAIR Bilateral    15 years ago  . 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 TOTAL KNEE ARTHROPLASTY Right 03/04/2014   Procedure: TOTAL KNEE ARTHROPLASTY;  Surgeon: Kerin Salen, MD;  Location: [facility];  Service:  Orthopedics;  Laterality: Right;  . TRANSFORAMINAL LUMBAR INTERBODY FUSION (TLIF) WITH PEDICLE SCREW FIXATION 1 LEVEL Left 03/28/2018   Procedure: LEFT SIDED LUMBAR FOUR-FIVE TRANSFORAMINAL LUMBAR INTERBODY FUSION WITH INSTRUMENTATION AND ALLOGRAFT;  Surgeon: Phylliss Bob, MD;  Location: [facility];  Service: Orthopedics;  Laterality: Left;  Marland Kitchen VASECTOMY     Family History  Problem Relation Age of Onset  . Breast cancer Mother   . Throat cancer Father   . Hypertension Brother   . Stroke Brother   . Hypertension Brother   . Hypertension Brother   . Colon cancer Neg Hx   . Stomach cancer Neg Hx   . Rectal cancer Neg Hx    Social History   Socioeconomic History  . Marital status: Married    Spouse name: Not on file  . Number of children: Not on file  . Years of education: Not on file  . Highest education level: Not on file  Occupational History  . Not on file  Tobacco Use  . Smoking status: Never Smoker  . Smokeless tobacco: Former Systems developer    Types: Chew  Substance and Sexual Activity  . Alcohol use: Yes    Alcohol/week: 14.0 standard drinks    Types: 14 Cans of beer per week    Comment: 2-3 beer a day  . Drug use: No  . Sexual activity: Yes    Partners: Female    Birth control/protection: None    ROS Constitutional: Denies fever, chills, weight loss/gain, headaches, insomnia,  night sweats or change in appetite. Does c/o fatigue. Eyes: Denies redness, blurred vision, diplopia, discharge, itchy or watery eyes.  ENT: Denies discharge, congestion, post nasal drip, epistaxis, sore throat, earache, hearing loss, dental pain, Tinnitus, Vertigo, Sinus pain or snoring.  Cardio: Denies chest pain, palpitations, irregular heartbeat, syncope, dyspnea, diaphoresis, orthopnea, PND, claudication or edema Respiratory: denies cough, dyspnea, DOE, pleurisy, hoarseness, laryngitis or wheezing.  Gastrointestinal: Denies dysphagia, heartburn, reflux, water brash, pain, cramps, nausea, vomiting,  bloating, diarrhea, constipation, hematemesis, melena, hematochezia, jaundice or hemorrhoids Genitourinary: Denies dysuria, frequency, urgency, nocturia, hesitancy, discharge, hematuria or flank pain Musculoskeletal: Denies arthralgia, myalgia, stiffness, Jt. Swelling, pain, limp or strain/sprain. Denies Falls. Skin: Denies puritis, rash, hives, warts, acne, eczema or change in skin lesion Neuro: No weakness, tremor, incoordination, spasms, paresthesia or pain Psychiatric: Denies confusion, memory loss or sensory loss. Denies Depression. Endocrine: Denies change in weight, skin, hair change, nocturia, and paresthesia, diabetic polys, visual blurring or hyper / hypo glycemic episodes.  Heme/Lymph: No excessive bleeding, bruising or enlarged lymph nodes.  Physical Exam  BP 140/82   Pulse 76   Temp (!) 97 F (36.1 C)   Resp 16   Ht 5\' 9"  (1.753 m)   Wt 169 lb 9.6 oz (76.9 kg)  BMI 25.05 kg/m   General Appearance: Well nourished and well groomed and in no apparent distress.  Eyes: PERRLA, EOMs, conjunctiva no swelling or erythema, normal fundi and vessels. Sinuses: No frontal/maxillary tenderness ENT/Mouth: EACs patent / TMs  nl. Nares clear without erythema, swelling, mucoid exudates. Oral hygiene is good. No erythema, swelling, or exudate. Tongue normal, non-obstructing. Tonsils not swollen or erythematous. Hearing normal.  Neck: Supple, thyroid not palpable. No bruits, nodes or JVD. Respiratory: Respiratory effort normal.  BS equal and clear bilateral without rales, rhonci, wheezing or stridor. Cardio: Heart sounds are normal with regular rate and rhythm and no murmurs, rubs or gallops. Peripheral pulses are normal and equal bilaterally without edema. No aortic or femoral bruits. Chest: symmetric with normal excursions and percussion.  Abdomen: Soft, with Nl bowel sounds. Nontender, no guarding, rebound, hernias, masses, or organomegaly.  Lymphatics: Non tender without lymphadenopathy.   Musculoskeletal: Full ROM all peripheral extremities, joint stability, 5/5 strength, and normal gait. Skin: Warm and dry without rashes, lesions, cyanosis, clubbing or  ecchymosis.  Neuro: Cranial nerves intact, reflexes equal bilaterally. Normal muscle tone, no cerebellar symptoms. Sensation intact.  Pysch: Alert and oriented X 3 with normal affect, insight and judgment appropriate.   Assessment and Plan  1. Annual Preventative/Screening Exam   2. Essential hypertension  - EKG 12-Lead - US, RETROPERITNL ABD,  LTD - Urinalysis, Routine w reflex microscopic - Microalbumin / creatinine urine ratio - CBC with Differential/Platelet - COMPLETE METABOLIC PANEL WITH GFR - Magnesium - TSH  3. Hyperlipidemia, mixed  - EKG 12-Lead - US, RETROPERITNL ABD,  LTD - Lipid panel - TSH  4. Abnormal glucose  - EKG 12-Lead - US, RETROPERITNL ABD,  LTD - Hemoglobin A1c - Insulin, random  5. Vitamin D deficiency  - VITAMIN D 25 Hydroxyl  6. Gastroesophageal reflux disease  - CBC with Differential/Platelet  7. Prediabetes  - EKG 12-Lead - US, RETROPERITNL ABD,  LTD  8. Screening examination for pulmonary tuberculosis  - TB Skin Test  9. Screening for colorectal cancer  - POC Hemoccult Bld/Stl  10. Prostate cancer screening  - PSA  11. BPH with obstruction/lower urinary tract symptoms  - PSA  12. Screening for ischemic heart disease  - EKG 12-Lead  13. FH: hypertension  - EKG 12-Lead - US, RETROPERITNL ABD,  LTD  14. Screening for AAA (aortic abdominal aneurysm)  - US, RETROPERITNL ABD,  LTD  15. Fatigue, unspecified type  - Iron,Total/Total Iron Binding Cap - Vitamin B12 - Testosterone - CBC with Differential/Platelet  16. Medication management  - Urinalysis, Routine w reflex microscopic - Microalbumin / creatinine urine ratio - CBC with Differential/Platelet - COMPLETE METABOLIC PANEL WITH GFR - Magnesium - Lipid panel - TSH - Hemoglobin  A1c - Insulin, random - VITAMIN D 25 Hydroxyl        Patient was counseled in prudent diet, weight control to achieve/maintain BMI less than 25, BP monitoring, regular exercise and medications as discussed.  Discussed med effects and SE's. Routine screening labs and tests as requested with regular follow-up as recommended. Over 40 minutes of exam, counseling, chart review and high complex critical decision making was performed   Marinus MawWilliam D Kalana Yust, MD

## 2018-07-30 NOTE — Patient Instructions (Signed)
- Vit D  And Vit C 1,000 mg  are recommended to help protect  against the Covid_19 and other Corona viruses.   - Also it's recommended to take Zinc 50 mg to help  protect against the Covid_19  And best place to get  is also on [employer] and don't pay more than 6-8 cents /pill !   =============================== Coronavirus (COVID-19) Are you at risk?  Are you at risk for the Coronavirus (COVID-19)?  To be considered HIGH RISK for Coronavirus (COVID-19), you have to meet the following criteria:  . Traveled to [place], [place], [place], [place] or [place]; or in the [place] to [place], [place], [place]  . or [place]; and have fever, cough, and shortness of breath within the last 2 weeks of travel OR . Been in close contact with a person diagnosed with COVID-19 within the last 2 weeks and have  . fever, cough,and shortness of breath .  . IF YOU DO NOT MEET THESE CRITERIA, YOU ARE CONSIDERED LOW RISK FOR COVID-19.  What to do if you are HIGH RISK for COVID-19?  . If you are having a medical emergency, call 911. . Seek medical care right away. Before you go to a doctor's office, urgent care or emergency department, .  call ahead and tell them about your recent travel, contact with someone diagnosed with COVID-19  .  and your symptoms.  . You should receive instructions from your physician's office regarding next steps of care.  . When you arrive at healthcare provider, tell the healthcare staff immediately you have returned from  . visiting [place], [place], [place], [place] or [place]; or traveled in the [place] to [place], [place],  . [place] or [place] in the last two weeks or you have been in close contact with a person diagnosed with  . COVID-19 in the last 2 weeks.   . Tell the health care staff about your symptoms: fever, cough and shortness of breath. . After you have been seen by a medical provider, you will be either: o Tested for (COVID-19) and  discharged home on quarantine except to seek medical care if  o symptoms worsen, and asked to  - Stay home and avoid contact with others until you get your results (4-5 days)  - Avoid travel on public transportation if possible (such as bus, train, or airplane) or o Sent to the Emergency Department by EMS for evaluation, COVID-19 testing  and  o possible admission depending on your condition and test results.  What to do if you are LOW RISK for COVID-19?  Reduce your risk of any infection by using the same precautions used for avoiding the common cold or flu:  . Wash your hands often with soap and warm water for at least 20 seconds.  If soap and water are not readily available,  . use an alcohol-based hand sanitizer with at least 60% alcohol.  . If coughing or sneezing, cover your mouth and nose by coughing or sneezing into the elbow areas of your shirt or coat, .  into a tissue or into your sleeve (not your hands). . Avoid shaking hands with others and consider head nods or verbal greetings only. . Avoid touching your eyes, nose, or mouth with unwashed hands.  . Avoid close contact with people who are sick. . Avoid places or events with large numbers of people in one location, like concerts or sporting events. . Carefully consider travel plans   you have or are making. . If you are planning any travel outside or inside the [place], visit the CDC's Travelers' Health webpage for the latest health notices. . If you have some symptoms but not all symptoms, continue to monitor at home and seek medical attention  . if your symptoms worsen. . If you are having a medical emergency, call 911. >>>>>>>>>>>>>>>>>>>>>>>>>>>> Preventive Care for Adults  A healthy lifestyle and preventive care can promote health and wellness. Preventive health guidelines for men include the following key practices:  A routine yearly physical is a good way to check with your health care provider about your health and  preventative screening. It is a chance to share any concerns and updates on your health and to receive a thorough exam.  Visit your dentist for a routine exam and preventative care every 6 months. Brush your teeth twice a day and floss once a day. Good oral hygiene prevents tooth decay and gum disease.  The frequency of eye exams is based on your age, health, family medical history, use of contact lenses, and other factors. Follow your health care provider's recommendations for frequency of eye exams.  Eat a healthy diet. Foods such as vegetables, fruits, whole grains, low-fat dairy products, and lean protein foods contain the nutrients you need without too many calories. Decrease your intake of foods high in solid fats, added sugars, and salt. Eat the right amount of calories for you. Get information about a proper diet from your health care provider, if necessary.  Regular physical exercise is one of the most important things you can do for your health. Most adults should get at least 150 minutes of moderate-intensity exercise (any activity that increases your heart rate and causes you to sweat) each week. In addition, most adults need muscle-strengthening exercises on 2 or more days a week.  Maintain a healthy weight. The body mass index (BMI) is a screening tool to identify possible weight problems. It provides an estimate of body fat based on height and weight. Your health care provider can find your BMI and can help you achieve or maintain a healthy weight. For adults 20 years and older:  A BMI below 18.5 is considered underweight.  A BMI of 18.5 to 24.9 is normal.  A BMI of 25 to 29.9 is considered overweight.  A BMI of 30 and above is considered obese.  Maintain normal blood lipids and cholesterol levels by exercising and minimizing your intake of saturated fat. Eat a balanced diet with plenty of fruit and vegetables. Blood tests for lipids and cholesterol should begin at age 20 and be  repeated every 5 years. If your lipid or cholesterol levels are high, you are over 50, or you are at high risk for heart disease, you may need your cholesterol levels checked more frequently. Ongoing high lipid and cholesterol levels should be treated with medicines if diet and exercise are not working.  If you smoke, find out from your health care provider how to quit. If you do not use tobacco, do not start.  Lung cancer screening is recommended for adults aged 55-80 years who are at high risk for developing lung cancer because of a history of smoking. A yearly low-dose CT scan of the lungs is recommended for people who have at least a 30-pack-year history of smoking and are a current smoker or have quit within the past 15 years. A pack year of smoking is smoking an average of 1 pack of cigarettes a   day for 1 year (for example: 1 pack a day for 30 years or 2 packs a day for 15 years). Yearly screening should continue until the smoker has stopped smoking for at least 15 years. Yearly screening should be stopped for people who develop a health problem that would prevent them from having lung cancer treatment.  If you choose to drink alcohol, do not have more than 2 drinks per day. One drink is considered to be 12 ounces (355 mL) of beer, 5 ounces (148 mL) of wine, or 1.5 ounces (44 mL) of liquor.  Avoid use of street drugs. Do not share needles with anyone. Ask for help if you need support or instructions about stopping the use of drugs.  High blood pressure causes heart disease and increases the risk of stroke. Your blood pressure should be checked at least every 1-2 years. Ongoing high blood pressure should be treated with medicines, if weight loss and exercise are not effective.  If you are 45-79 years old, ask your health care provider if you should take aspirin to prevent heart disease.  Diabetes screening involves taking a blood sample to check your fasting blood sugar level. This should be done  once every 3 years, after age 45, if you are within normal weight and without risk factors for diabetes. Testing should be considered at a younger age or be carried out more frequently if you are overweight and have at least 1 risk factor for diabetes.  Colorectal cancer can be detected and often prevented. Most routine colorectal cancer screening begins at the age of 50 and continues through age 75. However, your health care provider may recommend screening at an earlier age if you have risk factors for colon cancer. On a yearly basis, your health care provider may provide home test kits to check for hidden blood in the stool. Use of a small camera at the end of a tube to directly examine the colon (sigmoidoscopy or colonoscopy) can detect the earliest forms of colorectal cancer. Talk to your health care provider about this at age 50, when routine screening begins. Direct exam of the colon should be repeated every 5-10 years through age 75, unless early forms of precancerous polyps or small growths are found.   Talk with your health care provider about prostate cancer screening.  Testicular cancer screening isrecommended for adult males. Screening includes self-exam, a health care provider exam, and other screening tests. Consult with your health care provider about any symptoms you have or any concerns you have about testicular cancer.  Use sunscreen. Apply sunscreen liberally and repeatedly throughout the day. You should seek shade when your shadow is shorter than you. Protect yourself by wearing long sleeves, pants, a wide-brimmed hat, and sunglasses year round, whenever you are outdoors.  Once a month, do a whole-body skin exam, using a mirror to look at the skin on your back. Tell your health care provider about new moles, moles that have irregular borders, moles that are larger than a pencil eraser, or moles that have changed in shape or color.  Stay current with required vaccines  (immunizations).  Influenza vaccine. All adults should be immunized every year.  Tetanus, diphtheria, and acellular pertussis (Td, Tdap) vaccine. An adult who has not previously received Tdap or who does not know his vaccine status should receive 1 dose of Tdap. This initial dose should be followed by tetanus and diphtheria toxoids (Td) booster doses every 10 years. Adults with an unknown or incomplete history   of completing a 3-dose immunization series with Td-containing vaccines should begin or complete a primary immunization series including a Tdap dose. Adults should receive a Td booster every 10 years.  Varicella vaccine. An adult without evidence of immunity to varicella should receive 2 doses or a second dose if he has previously received 1 dose.  Human papillomavirus (HPV) vaccine. Males aged 13-21 years who have not received the vaccine previously should receive the 3-dose series. Males aged 22-26 years may be immunized. Immunization is recommended through the age of 26 years for any male who has sex with males and did not get any or all doses earlier. Immunization is recommended for any person with an immunocompromised condition through the age of 26 years if he did not get any or all doses earlier. During the 3-dose series, the second dose should be obtained 4-8 weeks after the first dose. The third dose should be obtained 24 weeks after the first dose and 16 weeks after the second dose.  Zoster vaccine. One dose is recommended for adults aged 60 years or older unless certain conditions are present.    PREVNAR  - Pneumococcal 13-valent conjugate (PCV13) vaccine. When indicated, a person who is uncertain of his immunization history and has no record of immunization should receive the PCV13 vaccine. An adult aged 19 years or older who has certain medical conditions and has not been previously immunized should receive 1 dose of PCV13 vaccine. This PCV13 should be followed with a dose of  pneumococcal polysaccharide (PPSV23) vaccine. The PPSV23 vaccine dose should be obtained at least 1 r more year(s) after the dose of PCV13 vaccine. An adult aged 19 years or older who has certain medical conditions and previously received 1 or more doses of PPSV23 vaccine should receive 1 dose of PCV13. The PCV13 vaccine dose should be obtained 1 or more years after the last PPSV23 vaccine dose.    PNEUMOVAX - Pneumococcal polysaccharide (PPSV23) vaccine. When PCV13 is also indicated, PCV13 should be obtained first. All adults aged 65 years and older should be immunized. An adult younger than age 65 years who has certain medical conditions should be immunized. Any person who resides in a nursing home or long-term care facility should be immunized. An adult smoker should be immunized. People with an immunocompromised condition and certain other conditions should receive both PCV13 and PPSV23 vaccines. People with human immunodeficiency virus (HIV) infection should be immunized as soon as possible after diagnosis. Immunization during chemotherapy or radiation therapy should be avoided. Routine use of PPSV23 vaccine is not recommended for American Indians, [place] Natives, or people younger than 65 years unless there are medical conditions that require PPSV23 vaccine. When indicated, people who have unknown immunization and have no record of immunization should receive PPSV23 vaccine. One-time revaccination 5 years after the first dose of PPSV23 is recommended for people aged 19-64 years who have chronic kidney failure, nephrotic syndrome, asplenia, or immunocompromised conditions. People who received 1-2 doses of PPSV23 before age 65 years should receive another dose of PPSV23 vaccine at age 65 years or later if at least 5 years have passed since the previous dose. Doses of PPSV23 are not needed for people immunized with PPSV23 at or after age 65 years.    Hepatitis A vaccine. Adults who wish to be protected  from this disease, have certain high-risk conditions, work with hepatitis A-infected animals, work in hepatitis A research labs, or travel to or work in countries with a high rate of   hepatitis A should be immunized. Adults who were previously unvaccinated and who anticipate close contact with an international adoptee during the first 60 days after arrival in the [place] from a country with a high rate of hepatitis A should be immunized.    Hepatitis B vaccine. Adults should be immunized if they wish to be protected from this disease, have certain high-risk conditions, may be exposed to blood or other infectious body fluids, are household contacts or sex partners of hepatitis B positive people, are clients or workers in certain care facilities, or travel to or work in countries with a high rate of hepatitis B.   Preventive Service / Frequency   Ages 40 to 64  Blood pressure check.  Lipid and cholesterol check  Lung cancer screening. / Every year if you are aged 55-80 years and have a 30-pack-year history of smoking and currently smoke or have quit within the past 15 years. Yearly screening is stopped once you have quit smoking for at least 15 years or develop a health problem that would prevent you from having lung cancer treatment.  Fecal occult blood test (FOBT) of stool. / Every year beginning at age 50 and continuing until age 75. You may not have to do this test if you get a colonoscopy every 10 years.  Flexible sigmoidoscopy** or colonoscopy.** / Every 5 years for a flexible sigmoidoscopy or every 10 years for a colonoscopy beginning at age 50 and continuing until age 75. Screening for abdominal aortic aneurysm (AAA)  by ultrasound is recommended for people who have history of high blood pressure or who are current or former smokers. +++++++++++ Recommend Adult Low Dose Aspirin or  coated  Aspirin 81 mg daily  To reduce risk of Colon Cancer 40 %,  Skin Cancer 26 % ,  Malignant  Melanoma 46%  and  Pancreatic cancer 60% ++++++++++++++++++++ Vitamin D goal  is between 70-100.  Please make sure that you are taking your Vitamin D as directed.  It is very important as a natural anti-inflammatory  helping hair, skin, and nails, as well as reducing stroke and heart attack risk.  It helps your bones and helps with mood. It also decreases numerous cancer risks so please take it as directed.  Low Vit D is associated with a 200-300% higher risk for CANCER  and 200-300% higher risk for HEART   ATTACK  &  STROKE.   ...................................... It is also associated with higher death rate at younger ages,  autoimmune diseases like Rheumatoid arthritis, Lupus, Multiple Sclerosis.    Also many other serious conditions, like depression, Alzheimer's Dementia, infertility, muscle aches, fatigue, fibromyalgia - just to name a few. +++++++++++++++++++++ Recommend the book "The END of DIETING" by Dr Joel Fuhrman  & the book "The END of DIABETES " by Dr Joel Fuhrman At [employer] - get book & Audio CD's    Being diabetic has a  300% increased risk for heart attack, stroke, cancer, and alzheimer- type vascular dementia. It is very important that you work harder with diet by avoiding all foods that are white. Avoid white rice (brown & wild rice is OK), white potatoes (sweetpotatoes in moderation is OK), White bread or wheat bread or anything made out of white flour like bagels, donuts, rolls, buns, biscuits, cakes, pastries, cookies, pizza crust, and pasta (made from white flour & egg whites) - vegetarian pasta or spinach or wheat pasta is OK. Multigrain breads like Arnold's or [employer], or multigrain sandwich thins   or flatbreads.  Diet, exercise and weight loss can reverse and cure diabetes in the early stages.  Diet, exercise and weight loss is very important in the control and prevention of complications of diabetes which affects every system in your body, ie. Brain -  dementia/stroke, eyes - glaucoma/blindness, heart - heart attack/heart failure, kidneys - dialysis, stomach - gastric paralysis, intestines - malabsorption, nerves - severe painful neuritis, circulation - gangrene & loss of a leg(s), and finally cancer and Alzheimers.    I recommend avoid fried & greasy foods,  sweets/candy, white rice (brown or wild rice or Quinoa is OK), white potatoes (sweet potatoes are OK) - anything made from white flour - bagels, doughnuts, rolls, buns, biscuits,white and wheat breads, pizza crust and traditional pasta made of white flour & egg white(vegetarian pasta or spinach or wheat pasta is OK).  Multi-grain bread is OK - like multi-grain flat bread or sandwich thins. Avoid alcohol in excess. Exercise is also important.    Eat all the vegetables you want - avoid meat, especially red meat and dairy - especially cheese.  Cheese is the most concentrated form of trans-fats which is the worst thing to clog up our arteries. Veggie cheese is OK which can be found in the fresh produce section at [employer] or [employer] or [employer]  ++++++++++++++++++++++ DASH Eating Plan  DASH stands for "Dietary Approaches to Stop Hypertension."   The DASH eating plan is a healthy eating plan that has been shown to reduce high blood pressure (hypertension). Additional health benefits may include reducing the risk of type 2 diabetes mellitus, heart disease, and stroke. The DASH eating plan may also help with weight loss. WHAT DO I NEED TO KNOW ABOUT THE DASH EATING PLAN? For the DASH eating plan, you will follow these general guidelines:  Choose foods with a percent daily value for sodium of less than 5% (as listed on the food label).  Use salt-free seasonings or herbs instead of table salt or sea salt.  Check with your health care provider or pharmacist before using salt substitutes.  Eat lower-sodium products, often labeled as "lower sodium" or "no salt added."  Eat fresh  foods.  Eat more vegetables, fruits, and low-fat dairy products.  Choose whole grains. Look for the word "whole" as the first word in the ingredient list.  Choose fish   Limit sweets, desserts, sugars, and sugary drinks.  Choose heart-healthy fats.  Eat veggie cheese   Eat more home-cooked food and less restaurant, buffet, and fast food.  Limit fried foods.  Cook foods using methods other than frying.  Limit canned vegetables. If you do use them, rinse them well to decrease the sodium.  When eating at a restaurant, ask that your food be prepared with less salt, or no salt if possible.                      WHAT FOODS CAN I EAT? Read Dr Joel Fuhrman's books on The End of Dieting & The End of Diabetes  Grains Whole grain or whole wheat bread. Brown rice. Whole grain or whole wheat pasta. Quinoa, bulgur, and whole grain cereals. Low-sodium cereals. Corn or whole wheat flour tortillas. Whole grain cornbread. Whole grain crackers. Low-sodium crackers.  Vegetables Fresh or frozen vegetables (raw, steamed, roasted, or grilled). Low-sodium or reduced-sodium tomato and vegetable juices. Low-sodium or reduced-sodium tomato sauce and paste. Low-sodium or reduced-sodium canned vegetables.   Fruits All fresh, canned (in natural juice), or   frozen fruits.  Protein Products  All fish and seafood.  Dried beans, peas, or lentils. Unsalted nuts and seeds. Unsalted canned beans.  Dairy Low-fat dairy products, such as skim or 1% milk, 2% or reduced-fat cheeses, low-fat ricotta or cottage cheese, or plain low-fat yogurt. Low-sodium or reduced-sodium cheeses.  Fats and Oils Tub margarines without trans fats. Light or reduced-fat mayonnaise and salad dressings (reduced sodium). Avocado. Safflower, olive, or canola oils. Natural peanut or almond butter.  Other Unsalted popcorn and pretzels. The items listed above may not be a complete list of recommended foods or beverages. Contact your  dietitian for more options.  +++++++++++++++++++  WHAT FOODS ARE NOT RECOMMENDED? Grains/ White flour or wheat flour White bread. White pasta. White rice. Refined cornbread. Bagels and croissants. Crackers that contain trans fat.  Vegetables  Creamed or fried vegetables. Vegetables in a . Regular canned vegetables. Regular canned tomato sauce and paste. Regular tomato and vegetable juices.  Fruits Dried fruits. Canned fruit in light or heavy syrup. Fruit juice.  Meat and Other Protein Products Meat in general - RED meat & White meat.  Fatty cuts of meat. Ribs, chicken wings, all processed meats as bacon, sausage, bologna, salami, fatback, hot dogs, bratwurst and packaged luncheon meats.  Dairy Whole or 2% milk, cream, half-and-half, and cream cheese. Whole-fat or sweetened yogurt. Full-fat cheeses or blue cheese. Non-dairy creamers and whipped toppings. Processed cheese, cheese spreads, or cheese curds.  Condiments Onion and garlic salt, seasoned salt, table salt, and sea salt. Canned and packaged gravies. Worcestershire sauce. Tartar sauce. Barbecue sauce. Teriyaki sauce. Soy sauce, including reduced sodium. Steak sauce. Fish sauce. Oyster sauce. Cocktail sauce. Horseradish. Ketchup and mustard. Meat flavorings and tenderizers. Bouillon cubes. Hot sauce. Tabasco sauce. Marinades. Taco seasonings. Relishes.  Fats and Oils Butter, stick margarine, lard, shortening and bacon fat. Coconut, palm kernel, or palm oils. Regular salad dressings.  Pickles and olives. Salted popcorn and pretzels.  The items listed above may not be a complete list of foods and beverages to avoid.    

## 2018-07-31 ENCOUNTER — Other Ambulatory Visit: Payer: Self-pay

## 2018-07-31 ENCOUNTER — Ambulatory Visit (INDEPENDENT_AMBULATORY_CARE_PROVIDER_SITE_OTHER): Payer: Commercial Managed Care - PPO | Admitting: Internal Medicine

## 2018-07-31 ENCOUNTER — Other Ambulatory Visit: Payer: Self-pay | Admitting: *Deleted

## 2018-07-31 VITALS — BP 140/82 | HR 76 | Temp 97.0°F | Resp 16 | Ht 69.0 in | Wt 169.6 lb

## 2018-07-31 DIAGNOSIS — N401 Enlarged prostate with lower urinary tract symptoms: Secondary | ICD-10-CM

## 2018-07-31 DIAGNOSIS — Z0001 Encounter for general adult medical examination with abnormal findings: Secondary | ICD-10-CM

## 2018-07-31 DIAGNOSIS — R7309 Other abnormal glucose: Secondary | ICD-10-CM

## 2018-07-31 DIAGNOSIS — Z79899 Other long term (current) drug therapy: Secondary | ICD-10-CM

## 2018-07-31 DIAGNOSIS — I10 Essential (primary) hypertension: Secondary | ICD-10-CM

## 2018-07-31 DIAGNOSIS — Z8249 Family history of ischemic heart disease and other diseases of the circulatory system: Secondary | ICD-10-CM | POA: Diagnosis not present

## 2018-07-31 DIAGNOSIS — Z136 Encounter for screening for cardiovascular disorders: Secondary | ICD-10-CM

## 2018-07-31 DIAGNOSIS — N138 Other obstructive and reflux uropathy: Secondary | ICD-10-CM

## 2018-07-31 DIAGNOSIS — Z111 Encounter for screening for respiratory tuberculosis: Secondary | ICD-10-CM | POA: Diagnosis not present

## 2018-07-31 DIAGNOSIS — Z0000 Encounter for general adult medical examination without abnormal findings: Secondary | ICD-10-CM

## 2018-07-31 DIAGNOSIS — Z1211 Encounter for screening for malignant neoplasm of colon: Secondary | ICD-10-CM

## 2018-07-31 DIAGNOSIS — E559 Vitamin D deficiency, unspecified: Secondary | ICD-10-CM

## 2018-07-31 DIAGNOSIS — Z1212 Encounter for screening for malignant neoplasm of rectum: Secondary | ICD-10-CM

## 2018-07-31 DIAGNOSIS — E782 Mixed hyperlipidemia: Secondary | ICD-10-CM

## 2018-07-31 DIAGNOSIS — R7303 Prediabetes: Secondary | ICD-10-CM

## 2018-07-31 DIAGNOSIS — K219 Gastro-esophageal reflux disease without esophagitis: Secondary | ICD-10-CM

## 2018-07-31 DIAGNOSIS — R5383 Other fatigue: Secondary | ICD-10-CM

## 2018-07-31 DIAGNOSIS — Z125 Encounter for screening for malignant neoplasm of prostate: Secondary | ICD-10-CM

## 2018-07-31 MED ORDER — ROSUVASTATIN CALCIUM 20 MG PO TABS: ORAL_TABLET | ORAL | 1 refills | Status: DC

## 2018-08-01 LAB — COMPLETE METABOLIC PANEL WITH GFR
AG Ratio: 2.1 (calc) (ref 1.0–2.5)
ALT: 30 U/L (ref 9–46)
AST: 30 U/L (ref 10–35)
Albumin: 4.6 g/dL (ref 3.6–5.1)
Alkaline phosphatase (APISO): 65 U/L (ref 35–144)
BUN: 9 mg/dL (ref 7–25)
CO2: 27 mmol/L (ref 20–32)
Calcium: 9.7 mg/dL (ref 8.6–10.3)
Chloride: 104 mmol/L (ref 98–110)
Creat: 0.97 mg/dL (ref 0.70–1.33)
GFR, Est African American: 101 mL/min/{1.73_m2} (ref >=60)
GFR, Est Non African American: 88 mL/min/{1.73_m2} (ref >=60)
Globulin: 2.2 g/dL (calc) (ref 1.9–3.7)
Glucose, Bld: 87 mg/dL (ref 65–99)
Potassium: 4.2 mmol/L (ref 3.5–5.3)
Sodium: 138 mmol/L (ref 135–146)
Total Bilirubin: 0.6 mg/dL (ref 0.2–1.2)
Total Protein: 6.8 g/dL (ref 6.1–8.1)

## 2018-08-01 LAB — LIPID PANEL
Cholesterol: 194 mg/dL (ref <200)
HDL: 68 mg/dL (ref >=40)
LDL Cholesterol (Calc): 102 mg/dL (calc) — ABNORMAL HIGH
Non-HDL Cholesterol (Calc): 126 mg/dL (calc) (ref <130)
Total CHOL/HDL Ratio: 2.9 (calc) (ref <5.0)
Triglycerides: 137 mg/dL (ref <150)

## 2018-08-01 LAB — CBC WITH DIFFERENTIAL/PLATELET
Absolute Monocytes: 424 cells/uL (ref 200–950)
Basophils Absolute: 40 cells/uL (ref 0–200)
Basophils Relative: 1 %
Eosinophils Absolute: 60 cells/uL (ref 15–500)
Eosinophils Relative: 1.5 %
HCT: 38.4 % — ABNORMAL LOW (ref 38.5–50.0)
Hemoglobin: 12.5 g/dL — ABNORMAL LOW (ref 13.2–17.1)
Lymphs Abs: 1144 cells/uL (ref 850–3900)
MCH: 27.9 pg (ref 27.0–33.0)
MCHC: 32.6 g/dL (ref 32.0–36.0)
MCV: 85.7 fL (ref 80.0–100.0)
MPV: 11 fL (ref 7.5–12.5)
Monocytes Relative: 10.6 %
Neutro Abs: 2332 cells/uL (ref 1500–7800)
Neutrophils Relative %: 58.3 %
Platelets: 228 10*3/uL (ref 140–400)
RBC: 4.48 10*6/uL (ref 4.20–5.80)
RDW: 14.7 % (ref 11.0–15.0)
Total Lymphocyte: 28.6 %
WBC: 4 10*3/uL (ref 3.8–10.8)

## 2018-08-01 LAB — MICROALBUMIN / CREATININE URINE RATIO
Creatinine, Urine: 182 mg/dL (ref 20–320)
Microalb Creat Ratio: 3 mcg/mg creat (ref <30)
Microalb, Ur: 0.6 mg/dL

## 2018-08-01 LAB — TESTOSTERONE: Testosterone: 326 ng/dL (ref 250–827)

## 2018-08-01 LAB — TSH: TSH: 0.88 mIU/L (ref 0.40–4.50)

## 2018-08-01 LAB — URINALYSIS, ROUTINE W REFLEX MICROSCOPIC
Bilirubin Urine: NEGATIVE
Glucose, UA: NEGATIVE
Hgb urine dipstick: NEGATIVE
Ketones, ur: NEGATIVE
Leukocytes,Ua: NEGATIVE
Nitrite: NEGATIVE
Protein, ur: NEGATIVE
Specific Gravity, Urine: 1.021 (ref 1.001–1.03)
pH: 6.5 (ref 5.0–8.0)

## 2018-08-01 LAB — HEMOGLOBIN A1C
Hgb A1c MFr Bld: 5.1 % of total Hgb (ref <5.7)
Mean Plasma Glucose: 100 (calc)
eAG (mmol/L): 5.5 (calc)

## 2018-08-01 LAB — INSULIN, RANDOM: Insulin: 3.2 u[IU]/mL

## 2018-08-01 LAB — VITAMIN D 25 HYDROXY (VIT D DEFICIENCY, FRACTURES): Vit D, 25-Hydroxy: 78 ng/mL (ref 30–100)

## 2018-08-01 LAB — MAGNESIUM: Magnesium: 1.9 mg/dL (ref 1.5–2.5)

## 2018-08-01 LAB — VITAMIN B12: Vitamin B-12: 314 pg/mL (ref 200–1100)

## 2018-08-01 LAB — PSA: PSA: 0.5 ng/mL (ref <=4.0)

## 2018-08-01 LAB — IRON, TOTAL/TOTAL IRON BINDING CAP
%SAT: 12 % (calc) — ABNORMAL LOW (ref 20–48)
Iron: 48 ug/dL — ABNORMAL LOW (ref 50–180)
TIBC: 415 mcg/dL (calc) (ref 250–425)

## 2018-08-03 ENCOUNTER — Encounter: Payer: Self-pay | Admitting: Internal Medicine

## 2018-08-05 LAB — TB SKIN TEST
Induration: 0 mm
TB Skin Test: NEGATIVE

## 2018-08-19 ENCOUNTER — Other Ambulatory Visit: Payer: Self-pay | Admitting: Orthopedic Surgery

## 2018-08-26 ENCOUNTER — Other Ambulatory Visit: Payer: Self-pay | Admitting: Internal Medicine

## 2018-08-26 ENCOUNTER — Ambulatory Visit: Admit: 2018-08-26 | Payer: Commercial Managed Care - PPO | Admitting: Orthopedic Surgery

## 2018-08-26 SURGERY — ARTHROPLASTY, KNEE, TOTAL
Anesthesia: Spinal | Laterality: Left

## 2018-08-26 MED ORDER — BUPROPION HCL ER (XL) 150 MG PO TB24: ORAL_TABLET | ORAL | 0 refills | Status: DC

## 2018-08-29 ENCOUNTER — Other Ambulatory Visit: Payer: Self-pay | Admitting: Internal Medicine

## 2018-08-29 DIAGNOSIS — E782 Mixed hyperlipidemia: Secondary | ICD-10-CM

## 2018-09-24 NOTE — Patient Instructions (Addendum)
YOU NEED TO HAVE A COVID 19 TEST ON 09-26-18  @ 2:45 PM, THIS TEST MUST BE DONE BEFORE SURGERY, COME  [address], [name] , [address]. ONCE YOUR COVID TEST IS COMPLETED, PLEASE BEGIN THE QUARANTINE INSTRUCTIONS AS OUTLINED IN YOUR HANDOUT.                Jermaine Stewart  09/24/2018   Your procedure is scheduled on: 09-30-18    Report to [facility] Main  Entrance    Report to Admitting at 6:45 AM   1 VISITOR IS ALLOWED TO WAIT IN WAITING ROOM  ONLY DAY OF YOUR SURGERY.    Call this number if you have problems the morning of surgery [phone]    Remember: After Midnight.  NOTHING EXCEPT CLEAR LIQUIDS. PLEASE FINISH ENSURE DRINK  AT 6:15 AM.   CLEAR LIQUID DIET   Foods Allowed                                                                     Foods Excluded  Coffee and tea, regular and decaf                             liquids that you cannot  Plain Jell-O any favor except red or purple                                           see through such as: Fruit ices (not with fruit pulp)                                     milk, soups, orange juice  Iced Popsicles                                    All solid food Carbonated beverages, regular and diet                                    Cranberry, grape and apple juices Sports drinks like Gatorade Lightly seasoned clear broth or consume(fat free) Sugar, honey syrup  Sample Menu Breakfast                                Lunch                                     Supper Cranberry juice                    Beef broth                            Chicken broth Jell-O  Grape juice                           Apple juice Coffee or tea                        Jell-O                                      Popsicle                                                Coffee or tea                        Coffee or tea  _____________________________________________________________________       Take  these medicines the morning of surgery with A SIP OF WATER: Bupropion (Wellburtin), Ezetimibe (Zetia), Fenofibtate (Tricor), and Omperazole (Prilosec). You may also use and bring your nasal spray.  BRUSH YOUR TEETH MORNING OF SURGERY AND RINSE YOUR MOUTH OUT, NO CHEWING GUM CANDY OR MINTS.                                  You may not have any metal on your body including hair pins and              piercings     Do not wear jewelry, cologne, lotions, powders or deodorant                           Men may shave face and neck.   Do not bring valuables to the hospital. [facility] IS NOT             RESPONSIBLE   FOR VALUABLES.  Contacts, dentures or bridgework may not be worn into surgery.      Special Instructions: N/A              Please read over the following fact sheets you were given: _____________________________________________________________________             [facility] Before surgery, you can play an important role.  Because skin is not sterile, your skin needs to be as free of germs as possible.  You can reduce the number of germs on your skin by washing with CHG (chlorahexidine gluconate) soap before surgery.  CHG is an antiseptic cleaner which kills germs and bonds with the skin to continue killing germs even after washing. Please DO NOT use if you have an allergy to CHG or antibacterial soaps.  If your skin becomes reddened/irritated stop using the CHG and inform your nurse when you arrive at Short Stay. Do not shave (including legs and underarms) for at least 48 hours prior to the first CHG shower.  You may shave your face/neck. Please follow these instructions carefully:  1.  Shower with CHG Soap the night before surgery and the  morning of Surgery.  2.  If you choose to wash your hair, wash your hair first as usual with your  normal  shampoo.  3.  After you shampoo, rinse your  hair and body thoroughly to remove the  shampoo.                            4.  Use CHG as you would any other liquid soap.  You can apply chg directly  to the skin and wash                       Gently with a scrungie or clean washcloth.  5.  Apply the CHG Soap to your body ONLY FROM THE NECK DOWN.   Do not use on face/ open                           Wound or open sores. Avoid contact with eyes, ears mouth and genitals (private parts).                       Wash face,  Genitals (private parts) with your normal soap.             6.  Wash thoroughly, paying special attention to the area where your surgery  will be performed.  7.  Thoroughly rinse your body with warm water from the neck down.  8.  DO NOT shower/wash with your normal soap after using and rinsing off  the CHG Soap.                9.  Pat yourself dry with a clean towel.            10.  Wear clean pajamas.            11.  Place clean sheets on your bed the night of your first shower and do not  sleep with pets. Day of Surgery : Do not apply any lotions/deodorants the morning of surgery.  Please wear clean clothes to the hospital/surgery center.  FAILURE TO FOLLOW THESE INSTRUCTIONS MAY RESULT IN THE CANCELLATION OF YOUR SURGERY PATIENT SIGNATURE_________________________________  NURSE SIGNATURE__________________________________  ________________________________________________________________________   [name]  An incentive spirometer is a tool that can help keep your lungs clear and active. This tool measures how well you are filling your lungs with each breath. Taking long deep breaths may help reverse or decrease the chance of developing breathing (pulmonary) problems (especially infection) following:  A long period of time when you are unable to move or be active. BEFORE THE PROCEDURE   If the spirometer includes an indicator to show your best effort, your nurse or respiratory therapist will set it to a desired goal.  If possible, sit up straight or lean slightly forward.  Try not to slouch.  Hold the incentive spirometer in an upright position. INSTRUCTIONS FOR USE  1. Sit on the edge of your bed if possible, or sit up as far as you can in bed or on a chair. 2. Hold the incentive spirometer in an upright position. 3. Breathe out normally. 4. Place the mouthpiece in your mouth and seal your lips tightly around it. 5. Breathe in slowly and as deeply as possible, raising the piston or the ball toward the top of the column. 6. Hold your breath for 3-5 seconds or for as long as possible. Allow the piston or ball to fall to the bottom of the column. 7. Remove the mouthpiece from your mouth and breathe out normally. 8. Rest for a few seconds and repeat  Steps 1 through 7 at least 10 times every 1-2 hours when you are awake. Take your time and take a few normal breaths between deep breaths. 9. The spirometer may include an indicator to show your best effort. Use the indicator as a goal to work toward during each repetition. 10. After each set of 10 deep breaths, practice coughing to be sure your lungs are clear. If you have an incision (the cut made at the time of surgery), support your incision when coughing by placing a pillow or rolled up towels firmly against it. Once you are able to get out of bed, walk around indoors and cough well. You may stop using the incentive spirometer when instructed by your caregiver.  RISKS AND COMPLICATIONS  Take your time so you do not get dizzy or light-headed.  If you are in pain, you may need to take or ask for pain medication before doing incentive spirometry. It is harder to take a deep breath if you are having pain. AFTER USE  Rest and breathe slowly and easily.  It can be helpful to keep track of a log of your progress. Your caregiver can provide you with a simple table to help with this. If you are using the spirometer at home, follow these instructions: SEEK MEDICAL CARE IF:   You are having difficultly using the  spirometer.  You have trouble using the spirometer as often as instructed.  Your pain medication is not giving enough relief while using the spirometer.  You develop fever of 100.5 F (38.1 C) or higher. SEEK IMMEDIATE MEDICAL CARE IF:   You cough up bloody sputum that had not been present before.  You develop fever of 102 F (38.9 C) or greater.  You develop worsening pain at or near the incision site. MAKE SURE YOU:   Understand these instructions.  Will watch your condition.  Will get help right away if you are not doing well or get worse. Document Released: 06/19/2006 Document Revised: 05/01/2011 Document Reviewed: 08/20/2006 ExitCare Patient Information 2014 [employer], [place].   ________________________________________________________________________  WHAT IS A BLOOD TRANSFUSION? Blood Transfusion Information  A transfusion is the replacement of blood or some of its parts. Blood is made up of multiple cells which provide different functions.  Red blood cells carry oxygen and are used for blood loss replacement.  White blood cells fight against infection.  Platelets control bleeding.  Plasma helps clot blood.  Other blood products are available for specialized needs, such as hemophilia or other clotting disorders. BEFORE THE TRANSFUSION  Who gives blood for transfusions?   Healthy volunteers who are fully evaluated to make sure their blood is safe. This is blood bank blood. Transfusion therapy is the safest it has ever been in the practice of medicine. Before blood is taken from a donor, a complete history is taken to make sure that person has no history of diseases nor engages in risky social behavior (examples are intravenous drug use or sexual activity with multiple partners). The donor's travel history is screened to minimize risk of transmitting infections, such as malaria. The donated blood is tested for signs of infectious diseases, such as HIV and hepatitis.  The blood is then tested to be sure it is compatible with you in order to minimize the chance of a transfusion reaction. If you or a relative donates blood, this is often done in anticipation of surgery and is not appropriate for emergency situations. It takes many days to process the donated blood.  RISKS AND COMPLICATIONS Although transfusion therapy is very safe and saves many lives, the main dangers of transfusion include:   Getting an infectious disease.  Developing a transfusion reaction. This is an allergic reaction to something in the blood you were given. Every precaution is taken to prevent this. The decision to have a blood transfusion has been considered carefully by your caregiver before blood is given. Blood is not given unless the benefits outweigh the risks. AFTER THE TRANSFUSION  Right after receiving a blood transfusion, you will usually feel much better and more energetic. This is especially true if your red blood cells have gotten low (anemic). The transfusion raises the level of the red blood cells which carry oxygen, and this usually causes an energy increase.  The nurse administering the transfusion will monitor you carefully for complications. HOME CARE INSTRUCTIONS  No special instructions are needed after a transfusion. You may find your energy is better. Speak with your caregiver about any limitations on activity for underlying diseases you may have. SEEK MEDICAL CARE IF:   Your condition is not improving after your transfusion.  You develop redness or irritation at the intravenous (IV) site. SEEK IMMEDIATE MEDICAL CARE IF:  Any of the following symptoms occur over the next 12 hours:  Shaking chills.  You have a temperature by mouth above 102 F (38.9 C), not controlled by medicine.  Chest, back, or muscle pain.  People around you feel you are not acting correctly or are confused.  Shortness of breath or difficulty breathing.  Dizziness and fainting.  You  get a rash or develop hives.  You have a decrease in urine output.  Your urine turns a dark color or changes to pink, red, or brown. Any of the following symptoms occur over the next 10 days:  You have a temperature by mouth above 102 F (38.9 C), not controlled by medicine.  Shortness of breath.  Weakness after normal activity.  The white part of the eye turns yellow (jaundice).  You have a decrease in the amount of urine or are urinating less often.  Your urine turns a dark color or changes to pink, red, or brown. Document Released: 02/04/2000 Document Revised: 05/01/2011 Document Reviewed: 09/23/2007 [facility] Patient Information 2014 [place], [place].  _______________________________________________________________________

## 2018-09-25 ENCOUNTER — Encounter (HOSPITAL_COMMUNITY): Payer: Self-pay

## 2018-09-25 ENCOUNTER — Ambulatory Visit (HOSPITAL_COMMUNITY)
Admission: RE | Admit: 2018-09-25 | Discharge: 2018-09-25 | Disposition: A | Discharge status: Home / Self Care | Payer: Commercial Managed Care - PPO | Source: Ambulatory Visit | Attending: Orthopedic Surgery | Admitting: Orthopedic Surgery

## 2018-09-25 ENCOUNTER — Other Ambulatory Visit: Payer: Self-pay

## 2018-09-25 ENCOUNTER — Encounter (HOSPITAL_COMMUNITY)
Admission: RE | Admit: 2018-09-25 | Discharge: 2018-09-25 | Disposition: A | Discharge status: Home / Self Care | Payer: Commercial Managed Care - PPO | Source: Ambulatory Visit | Attending: Orthopedic Surgery | Admitting: Orthopedic Surgery

## 2018-09-25 DIAGNOSIS — Z01818 Encounter for other preprocedural examination: Secondary | ICD-10-CM | POA: Diagnosis present

## 2018-09-25 HISTORY — DX: Depression, unspecified: F32.A

## 2018-09-25 LAB — URINALYSIS, ROUTINE W REFLEX MICROSCOPIC
Bilirubin Urine: NEGATIVE
Glucose, UA: NEGATIVE mg/dL
Hgb urine dipstick: NEGATIVE
Ketones, ur: NEGATIVE mg/dL
Leukocytes,Ua: NEGATIVE
Nitrite: NEGATIVE
Protein, ur: NEGATIVE mg/dL
Specific Gravity, Urine: 1.014 (ref 1.005–1.030)
pH: 7 (ref 5.0–8.0)

## 2018-09-25 LAB — CBC WITH DIFFERENTIAL/PLATELET
Abs Immature Granulocytes: 0.01 10*3/uL (ref 0.00–0.07)
Basophils Absolute: 0 10*3/uL (ref 0.0–0.1)
Basophils Relative: 1 %
Eosinophils Absolute: 0.1 10*3/uL (ref 0.0–0.5)
Eosinophils Relative: 3 %
HCT: 39.8 % (ref 39.0–52.0)
Hemoglobin: 12.6 g/dL — ABNORMAL LOW (ref 13.0–17.0)
Immature Granulocytes: 0 %
Lymphocytes Relative: 32 %
Lymphs Abs: 1.3 10*3/uL (ref 0.7–4.0)
MCH: 28.2 pg (ref 26.0–34.0)
MCHC: 31.7 g/dL (ref 30.0–36.0)
MCV: 89 fL (ref 80.0–100.0)
Monocytes Absolute: 0.4 10*3/uL (ref 0.1–1.0)
Monocytes Relative: 11 %
Neutro Abs: 2.1 10*3/uL (ref 1.7–7.7)
Neutrophils Relative %: 53 %
Platelets: 206 10*3/uL (ref 150–400)
RBC: 4.47 MIL/uL (ref 4.22–5.81)
RDW: 15.1 % (ref 11.5–15.5)
WBC: 4 10*3/uL (ref 4.0–10.5)
nRBC: 0 % (ref 0.0–0.2)

## 2018-09-25 LAB — C-REACTIVE PROTEIN: CRP: <0.8 mg/dL (ref <1.0)

## 2018-09-25 LAB — BASIC METABOLIC PANEL
Anion gap: 7 (ref 5–15)
BUN: 11 mg/dL (ref 6–20)
CO2: 28 mmol/L (ref 22–32)
Calcium: 9.5 mg/dL (ref 8.9–10.3)
Chloride: 104 mmol/L (ref 98–111)
Creatinine, Ser: 1.12 mg/dL (ref 0.61–1.24)
GFR calc Af Amer: >60 mL/min (ref >60)
GFR calc non Af Amer: >60 mL/min (ref >60)
Glucose, Bld: 89 mg/dL (ref 70–99)
Potassium: 4.6 mmol/L (ref 3.5–5.1)
Sodium: 139 mmol/L (ref 135–145)

## 2018-09-25 LAB — APTT: aPTT: 26 seconds (ref 24–36)

## 2018-09-25 LAB — PROTIME-INR
INR: 1.1 (ref 0.8–1.2)
Prothrombin Time: 13.8 seconds (ref 11.4–15.2)

## 2018-09-25 LAB — SURGICAL PCR SCREEN
MRSA, PCR: NEGATIVE
Staphylococcus aureus: POSITIVE — AB

## 2018-09-25 LAB — SEDIMENTATION RATE: Sed Rate: 1 mm/hr (ref 0–16)

## 2018-09-26 ENCOUNTER — Other Ambulatory Visit (HOSPITAL_COMMUNITY)
Admission: RE | Admit: 2018-09-26 | Discharge: 2018-09-26 | Disposition: A | Discharge status: Home / Self Care | Payer: Commercial Managed Care - PPO | Source: Ambulatory Visit | Attending: Orthopedic Surgery | Admitting: Orthopedic Surgery

## 2018-09-26 DIAGNOSIS — Z20828 Contact with and (suspected) exposure to other viral communicable diseases: Secondary | ICD-10-CM | POA: Insufficient documentation

## 2018-09-26 DIAGNOSIS — Z01812 Encounter for preprocedural laboratory examination: Secondary | ICD-10-CM | POA: Insufficient documentation

## 2018-09-26 DIAGNOSIS — T84033A Mechanical loosening of internal left knee prosthetic joint, initial encounter: Secondary | ICD-10-CM | POA: Diagnosis present

## 2018-09-26 LAB — SARS CORONAVIRUS 2 (TAT 6-24 HRS): SARS Coronavirus 2: NEGATIVE

## 2018-09-26 LAB — ABO/RH: ABO/RH(D): A POS

## 2018-09-26 NOTE — Progress Notes (Signed)
09-26-18 PCR result routed to Dr. Mayer Camel for review

## 2018-09-26 NOTE — H&P (Signed)
TOTAL KNEE REVISION ADMISSION H&P  Patient is being admitted for left revision total knee arthroplasty.  Subjective:  Chief Complaint:left knee pain.  HPI: Jermaine NordmannJeffrey D Purdy, 58 y.o. male, has a history of pain and functional disability in the left knee(s) due to failed previous arthroplasty and patient has failed non-surgical conservative treatments for greater than 12 weeks to include NSAID's and/or analgesics, use of assistive devices and activity modification. The indications for the revision of the total knee arthroplasty are loosening of one or more components. Onset of symptoms was abrupt starting 1 years ago with rapidlly worsening course since that time.  Prior procedures on the left knee(s) include arthroplasty.  Patient currently rates pain in the left knee(s) at 10 out of 10 with activity. There is night pain, worsening of pain with activity and weight bearing, pain that interferes with activities of daily living and pain with passive range of motion.  Patient has evidence of prosthetic loosening by imaging studies. This condition presents safety issues increasing the risk of falls.   There is no current active infection.  Patient Active Problem List   Diagnosis Date Noted  . Anemia 04/23/2018  . GERD (gastroesophageal reflux disease) 04/19/2018  . Radiculopathy 03/28/2018  . Spinal stenosis, lumbar region, with neurogenic claudication 10/16/2017  . BMI 25.0-25.9,adult 10/12/2017  . Osteoarthritis of right knee 03/04/2014  . Other abnormal glucose 07/22/2013  . Medication management 07/22/2013  . Essential hypertension 03/12/2013  . Vitamin D deficiency   . Hyperlipidemia    Past Medical History:  Diagnosis Date  . Arthritis   . Depression   . GERD (gastroesophageal reflux disease)   . Hyperlipidemia   . Hypertension     Past Surgical History:  Procedure Laterality Date  . HERNIA REPAIR Bilateral    15 years ago  . 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   Procedure:  TOTAL KNEE ARTHROPLASTY;  Surgeon: Nestor LewandowskyFrank J Rowan, MD;  Location: [facility] OR;  Service: Orthopedics;  Laterality: Left;  . TOTAL KNEE ARTHROPLASTY Right 03/04/2014   Procedure: TOTAL KNEE ARTHROPLASTY;  Surgeon: Nestor LewandowskyFrank J Rowan, MD;  Location: [facility] OR;  Service: Orthopedics;  Laterality: Right;  . TRANSFORAMINAL LUMBAR INTERBODY FUSION (TLIF) WITH PEDICLE SCREW FIXATION 1 LEVEL Left 03/28/2018   Procedure: LEFT SIDED LUMBAR FOUR-FIVE TRANSFORAMINAL LUMBAR INTERBODY FUSION WITH INSTRUMENTATION AND ALLOGRAFT;  Surgeon: Estill Bambergumonski, Mark, MD;  Location: [facility] OR;  Service: Orthopedics;  Laterality: Left;  Marland Kitchen. VASECTOMY      No current facility-administered medications for this encounter.    Current Outpatient Medications  Medication Sig Dispense Refill Last Dose  . Ascorbic Acid (VITAMIN C) 1000 MG tablet Take 1,000 mg by mouth daily.     Marland Kitchen. atenolol (TENORMIN) 100 MG tablet Take 1 tablet at Bedtime for BP (Patient taking differently: Take 100 mg by mouth at bedtime. Take 1 tablet at Bedtime for BP) 90 tablet 3   . buPROPion (WELLBUTRIN XL) 150 MG 24 hr tablet Take 1 tablet every morning for Mood (Patient taking differently: Take 150 mg by mouth daily. Take 1 tablet every morning for Mood) 90 tablet 0   . Cholecalciferol (VITAMIN D-3) 5000 UNITS TABS Take 15,000 Units by mouth daily.      Marland Kitchen. ezetimibe (ZETIA) 10 MG tablet TAKE 1 TABLET DAILY (Patient taking differently: Take 10 mg by mouth daily. ) 90 tablet 0   . fenofibrate (TRICOR) 145 MG tablet TAKE 1 TABLET DAILY FOR BLOOD FATS (Patient taking differently: Take 145 mg by mouth daily. Take 1  tablet daily for Blood Fats) 90 tablet 3   . fluticasone (FLONASE) 50 MCG/ACT nasal spray Use 1 to Sprays each nares 1 to 2 x /day (Patient taking differently: Place 1 spray into both nostrils daily as needed for allergies. ) 48 g 3   . meloxicam (MOBIC) 15 MG tablet Take 15 mg by mouth daily.     Marland Kitchen omeprazole (PRILOSEC) 40 MG capsule Take 1 capsule 2 x /day for Acid Indigestion &  Reflux (Patient taking differently: Take 40 mg by mouth daily. ) 180 capsule 1   . rosuvastatin (CRESTOR) 20 MG tablet Take twice a week in the evening for cholesterol. (Patient taking differently: Take 20 mg by mouth 2 (two) times a week. Take twice a week in the evening for cholesterol. Tues and Thurs) 90 tablet 1   . zinc gluconate 50 MG tablet Take 50 mg by mouth daily.      Allergies  Allergen Reactions  . Atorvastatin Other (See Comments)    Arthralgias    Social History   Tobacco Use  . Smoking status: Never Smoker  . Smokeless tobacco: Former Systems developer    Types: Chew  Substance Use Topics  . Alcohol use: Yes    Alcohol/week: 14.0 standard drinks    Types: 14 Cans of beer per week    Comment: 2-3 beer a day    Family History  Problem Relation Age of Onset  . Breast cancer Mother   . Throat cancer Father   . Hypertension Brother   . Stroke Brother   . Hypertension Brother   . Hypertension Brother   . Colon cancer Neg Hx   . Stomach cancer Neg Hx   . Rectal cancer Neg Hx       Review of Systems  Constitutional: Negative.   HENT: Negative.   Eyes: Negative.   Respiratory: Negative.   Gastrointestinal: Negative.        Ulcers  Genitourinary: Negative.   Musculoskeletal: Positive for joint pain.       Ra  Skin: Negative.   Neurological: Negative.   Endo/Heme/Allergies: Negative.   Psychiatric/Behavioral: Negative.      Objective:  Physical Exam  Constitutional: He is oriented to person, place, and time. He appears well-developed and well-nourished.  HENT:  Head: Normocephalic and atraumatic.  Eyes: Pupils are equal, round, and reactive to light.  Neck: Normal range of motion. Neck supple.  Cardiovascular: Intact distal pulses.  Respiratory: Effort normal.  Musculoskeletal:        General: Tenderness present.     Comments: On examination the patient does have a varus deformity to his left total knee the wound is well-healed there is no palpable effusion range  of motion today is 5/95 does have pain with hard varus stress.    Neurological: He is alert and oriented to person, place, and time.  Skin: Skin is warm and dry.  Psychiatric: He has a normal mood and affect. His behavior is normal. Judgment and thought content normal.    Vital signs in last 24 hours: Temp:  [99.6 F (37.6 C)] 99.6 F (37.6 C) (08/05 1510) Pulse Rate:  [65] 65 (08/05 1510) Resp:  [18] 18 (08/05 1510) BP: (145)/(87) 145/87 (08/05 1510) SpO2:  [99 %] 99 % (08/05 1510) Weight:  [75.5 kg] 75.5 kg (08/05 1510)  Labs:  Estimated body mass index is 23.89 kg/m as calculated from the following:   Height as of 09/25/18: 5\' 10"  (1.778 m).   Weight as of  09/25/18: 75.5 kg.  Imaging Review Plain radiographs demonstrate that the femoral component has shifted and the varus we examined the bone scan images which are concordant.    Assessment/Plan:  End stage arthritis, left knee(s) with failed previous arthroplasty.   The patient history, physical examination, clinical judgment of the provider and imaging studies are consistent with end stage degenerative joint disease of the left knee(s), previous total knee arthroplasty. Revision total knee arthroplasty is deemed medically necessary. The treatment options including medical management, injection therapy, arthroscopy and revision arthroplasty were discussed at length. The risks and benefits of revision total knee arthroplasty were presented and reviewed. The risks due to aseptic loosening, infection, stiffness, patella tracking problems, thromboembolic complications and other imponderables were discussed. The patient acknowledged the explanation, agreed to proceed with the plan and consent was signed. Patient is being admitted for inpatient treatment for surgery, pain control, PT, OT, prophylactic antibiotics, VTE prophylaxis, progressive ambulation and ADL's and discharge planning.The patient is planning to be discharged home with home  health services

## 2018-09-27 ENCOUNTER — Telehealth (HOSPITAL_COMMUNITY): Payer: Self-pay | Admitting: *Deleted

## 2018-09-27 ENCOUNTER — Encounter (HOSPITAL_COMMUNITY): Payer: Self-pay | Admitting: Anesthesiology

## 2018-09-27 NOTE — Anesthesia Preprocedure Evaluation (Addendum)
Anesthesia Evaluation  Patient identified by MRN, date of birth, ID band Patient awake    Reviewed: Allergy & Precautions, NPO status , Patient's Chart, lab work & pertinent test results  Airway Mallampati: II  TM Distance: >3 FB Neck ROM: Full    Dental no notable dental hx. (+) Teeth Intact   Pulmonary neg pulmonary ROS,    Pulmonary exam normal breath sounds clear to auscultation       Cardiovascular hypertension, Pt. on medications Normal cardiovascular exam Rhythm:Regular Rate:Normal     Neuro/Psych Lumbar radiculopathy  Neuromuscular disease    GI/Hepatic Neg liver ROS, GERD  Controlled and Medicated,  Endo/Other  Hyperlipidemia  Renal/GU negative Renal ROS     Musculoskeletal  (+) Arthritis , Osteoarthritis,  Loose left total knee arthroplasty Lumbar spinal stenosis   Abdominal   Peds  Hematology  (+) anemia ,   Anesthesia Other Findings   Reproductive/Obstetrics                            Anesthesia Physical Anesthesia Plan  ASA: II  Anesthesia Plan: Spinal   Post-op Pain Management:  Regional for Post-op pain   Induction:   PONV Risk Score and Plan: 2 and Ondansetron, Propofol infusion, Dexamethasone and Treatment may vary due to age or medical condition  Airway Management Planned: Natural Airway  Additional Equipment:   Intra-op Plan:   Post-operative Plan:   Informed Consent: I have reviewed the patients History and Physical, chart, labs and discussed the procedure including the risks, benefits and alternatives for the proposed anesthesia with the patient or authorized representative who has indicated his/her understanding and acceptance.     Dental advisory given  Plan Discussed with: CRNA and Surgeon  Anesthesia Plan Comments:        Anesthesia Quick Evaluation

## 2018-09-29 MED ORDER — BUPIVACAINE LIPOSOME 1.3 % IJ SUSP
20.0000 mL | INTRAMUSCULAR | Status: DC
  Filled 2018-09-29: qty 20

## 2018-09-29 MED ORDER — TRANEXAMIC ACID 1000 MG/10ML IV SOLN
2000.0000 mg | INTRAVENOUS | Status: DC
  Filled 2018-09-29: qty 20

## 2018-09-30 ENCOUNTER — Encounter (HOSPITAL_COMMUNITY): Payer: Self-pay | Admitting: Certified Registered"

## 2018-09-30 ENCOUNTER — Ambulatory Visit (HOSPITAL_COMMUNITY): Payer: Commercial Managed Care - PPO | Admitting: Anesthesiology

## 2018-09-30 ENCOUNTER — Ambulatory Visit (HOSPITAL_COMMUNITY): Payer: Commercial Managed Care - PPO | Admitting: Physician Assistant

## 2018-09-30 ENCOUNTER — Observation Stay (HOSPITAL_COMMUNITY): Payer: Commercial Managed Care - PPO

## 2018-09-30 ENCOUNTER — Encounter (HOSPITAL_COMMUNITY)
Admission: RE | Disposition: A | Discharge status: Home / Self Care | Payer: Self-pay | Source: Home / Self Care | Attending: Orthopedic Surgery

## 2018-09-30 ENCOUNTER — Observation Stay (HOSPITAL_COMMUNITY)
Admission: RE | Admit: 2018-09-30 | Discharge: 2018-10-02 | Disposition: A | Discharge status: Home / Self Care | Payer: Commercial Managed Care - PPO | Attending: Orthopedic Surgery | Admitting: Orthopedic Surgery

## 2018-09-30 ENCOUNTER — Other Ambulatory Visit: Payer: Self-pay

## 2018-09-30 DIAGNOSIS — M5416 Radiculopathy, lumbar region: Secondary | ICD-10-CM | POA: Diagnosis not present

## 2018-09-30 DIAGNOSIS — I10 Essential (primary) hypertension: Secondary | ICD-10-CM | POA: Insufficient documentation

## 2018-09-30 DIAGNOSIS — M48062 Spinal stenosis, lumbar region with neurogenic claudication: Secondary | ICD-10-CM | POA: Insufficient documentation

## 2018-09-30 DIAGNOSIS — Z791 Long term (current) use of non-steroidal anti-inflammatories (NSAID): Secondary | ICD-10-CM | POA: Diagnosis not present

## 2018-09-30 DIAGNOSIS — F329 Major depressive disorder, single episode, unspecified: Secondary | ICD-10-CM | POA: Diagnosis not present

## 2018-09-30 DIAGNOSIS — Z96652 Presence of left artificial knee joint: Secondary | ICD-10-CM | POA: Diagnosis not present

## 2018-09-30 DIAGNOSIS — T84033A Mechanical loosening of internal left knee prosthetic joint, initial encounter: Principal | ICD-10-CM

## 2018-09-30 DIAGNOSIS — E785 Hyperlipidemia, unspecified: Secondary | ICD-10-CM | POA: Diagnosis not present

## 2018-09-30 DIAGNOSIS — D649 Anemia, unspecified: Secondary | ICD-10-CM | POA: Insufficient documentation

## 2018-09-30 DIAGNOSIS — E559 Vitamin D deficiency, unspecified: Secondary | ICD-10-CM | POA: Insufficient documentation

## 2018-09-30 DIAGNOSIS — M1711 Unilateral primary osteoarthritis, right knee: Secondary | ICD-10-CM | POA: Insufficient documentation

## 2018-09-30 DIAGNOSIS — G709 Myoneural disorder, unspecified: Secondary | ICD-10-CM | POA: Insufficient documentation

## 2018-09-30 DIAGNOSIS — K219 Gastro-esophageal reflux disease without esophagitis: Secondary | ICD-10-CM | POA: Insufficient documentation

## 2018-09-30 DIAGNOSIS — Z79899 Other long term (current) drug therapy: Secondary | ICD-10-CM | POA: Insufficient documentation

## 2018-09-30 DIAGNOSIS — Z7982 Long term (current) use of aspirin: Secondary | ICD-10-CM | POA: Insufficient documentation

## 2018-09-30 HISTORY — PX: TOTAL KNEE ARTHROPLASTY: SHX125

## 2018-09-30 LAB — TYPE AND SCREEN
ABO/RH(D): A POS
Antibody Screen: NEGATIVE

## 2018-09-30 SURGERY — ARTHROPLASTY, KNEE, TOTAL
Anesthesia: Spinal | Laterality: Left

## 2018-09-30 MED ORDER — BISACODYL 5 MG PO TBEC: 5.0000 mg | DELAYED_RELEASE_TABLET | Freq: Every day | ORAL | Status: DC | PRN

## 2018-09-30 MED ORDER — SODIUM CHLORIDE (PF) 0.9 % IJ SOLN
INTRAMUSCULAR | Status: AC
  Filled 2018-09-30: qty 50

## 2018-09-30 MED ORDER — PROPOFOL 10 MG/ML IV BOLUS
INTRAVENOUS | Status: DC | PRN
  Administered 2018-09-30 (×3): 20 mg via INTRAVENOUS

## 2018-09-30 MED ORDER — BUPIVACAINE IN DEXTROSE 0.75-8.25 % IT SOLN
INTRATHECAL | Status: DC | PRN
  Administered 2018-09-30: 2 mL via INTRATHECAL

## 2018-09-30 MED ORDER — BUPIVACAINE-EPINEPHRINE (PF) 0.25% -1:200000 IJ SOLN
INTRAMUSCULAR | Status: AC
  Filled 2018-09-30: qty 30

## 2018-09-30 MED ORDER — VITAMIN D 25 MCG (1000 UNIT) PO TABS: 15000.0000 [IU] | ORAL_TABLET | Freq: Every day | ORAL | Status: DC

## 2018-09-30 MED ORDER — CHLORHEXIDINE GLUCONATE 4 % EX LIQD: 60.0000 mL | Freq: Once | CUTANEOUS | Status: DC

## 2018-09-30 MED ORDER — LACTATED RINGERS IV SOLN
INTRAVENOUS | Status: DC
  Administered 2018-09-30 (×3): via INTRAVENOUS

## 2018-09-30 MED ORDER — ONDANSETRON HCL 4 MG/2ML IJ SOLN: 4.0000 mg | Freq: Once | INTRAMUSCULAR | Status: DC | PRN

## 2018-09-30 MED ORDER — ONDANSETRON HCL 4 MG/2ML IJ SOLN
INTRAMUSCULAR | Status: DC | PRN
  Administered 2018-09-30: 4 mg via INTRAVENOUS

## 2018-09-30 MED ORDER — FENTANYL CITRATE (PF) 100 MCG/2ML IJ SOLN
INTRAMUSCULAR | Status: DC | PRN
  Administered 2018-09-30: 100 ug via INTRAVENOUS

## 2018-09-30 MED ORDER — PROPOFOL 10 MG/ML IV BOLUS
INTRAVENOUS | Status: AC
  Filled 2018-09-30: qty 40

## 2018-09-30 MED ORDER — PHENOL 1.4 % MT LIQD
1.0000 | OROMUCOSAL | Status: DC | PRN
  Filled 2018-09-30: qty 177

## 2018-09-30 MED ORDER — ONDANSETRON HCL 4 MG PO TABS: 4.0000 mg | ORAL_TABLET | Freq: Four times a day (QID) | ORAL | Status: DC | PRN

## 2018-09-30 MED ORDER — TRANEXAMIC ACID 1000 MG/10ML IV SOLN
INTRAVENOUS | Status: DC | PRN
  Administered 2018-09-30: 2000 mg via TOPICAL

## 2018-09-30 MED ORDER — LIDOCAINE 2% (20 MG/ML) 5 ML SYRINGE
INTRAMUSCULAR | Status: AC
  Filled 2018-09-30: qty 5

## 2018-09-30 MED ORDER — SODIUM CHLORIDE 0.9 % IR SOLN
Status: DC | PRN
  Administered 2018-09-30: 3000 mL

## 2018-09-30 MED ORDER — DEXAMETHASONE SODIUM PHOSPHATE 10 MG/ML IJ SOLN
10.0000 mg | Freq: Once | INTRAMUSCULAR | Status: AC
  Administered 2018-10-01: 10 mg via INTRAVENOUS
  Filled 2018-09-30: qty 1

## 2018-09-30 MED ORDER — ASPIRIN EC 81 MG PO TBEC: 81.0000 mg | DELAYED_RELEASE_TABLET | Freq: Two times a day (BID) | ORAL | 0 refills | Status: DC

## 2018-09-30 MED ORDER — PROPOFOL 10 MG/ML IV BOLUS
INTRAVENOUS | Status: AC
  Filled 2018-09-30: qty 20

## 2018-09-30 MED ORDER — MIDAZOLAM HCL 5 MG/5ML IJ SOLN
INTRAMUSCULAR | Status: DC | PRN
  Administered 2018-09-30: 2 mg via INTRAVENOUS

## 2018-09-30 MED ORDER — FENTANYL CITRATE (PF) 100 MCG/2ML IJ SOLN
50.0000 ug | INTRAMUSCULAR | Status: DC
  Administered 2018-09-30: 100 ug via INTRAVENOUS
  Filled 2018-09-30: qty 2

## 2018-09-30 MED ORDER — SCOPOLAMINE 1 MG/3DAYS TD PT72
1.0000 | MEDICATED_PATCH | TRANSDERMAL | Status: DC
  Administered 2018-09-30: 1 via TRANSDERMAL
  Filled 2018-09-30: qty 1

## 2018-09-30 MED ORDER — FLEET ENEMA 7-19 GM/118ML RE ENEM: 1.0000 | ENEMA | Freq: Once | RECTAL | Status: DC | PRN

## 2018-09-30 MED ORDER — ASPIRIN 81 MG PO CHEW
81.0000 mg | CHEWABLE_TABLET | Freq: Two times a day (BID) | ORAL | Status: DC
  Administered 2018-09-30 – 2018-10-02 (×4): 81 mg via ORAL
  Filled 2018-09-30 (×4): qty 1

## 2018-09-30 MED ORDER — PANTOPRAZOLE SODIUM 40 MG PO TBEC: 40.0000 mg | DELAYED_RELEASE_TABLET | Freq: Every day | ORAL | Status: DC

## 2018-09-30 MED ORDER — FENTANYL CITRATE (PF) 100 MCG/2ML IJ SOLN: 25.0000 ug | INTRAMUSCULAR | Status: DC | PRN

## 2018-09-30 MED ORDER — TRANEXAMIC ACID-NACL 1000-0.7 MG/100ML-% IV SOLN
1000.0000 mg | Freq: Once | INTRAVENOUS | Status: AC
  Administered 2018-09-30: 1000 mg via INTRAVENOUS
  Filled 2018-09-30: qty 100

## 2018-09-30 MED ORDER — CEFAZOLIN SODIUM-DEXTROSE 2-4 GM/100ML-% IV SOLN
2.0000 g | INTRAVENOUS | Status: AC
  Administered 2018-09-30: 2 g via INTRAVENOUS
  Filled 2018-09-30: qty 100

## 2018-09-30 MED ORDER — HYDROMORPHONE HCL 1 MG/ML IJ SOLN: 0.5000 mg | INTRAMUSCULAR | Status: DC | PRN

## 2018-09-30 MED ORDER — EPHEDRINE 5 MG/ML INJ
INTRAVENOUS | Status: AC
  Filled 2018-09-30: qty 10

## 2018-09-30 MED ORDER — TIZANIDINE HCL 2 MG PO TABS: 2.0000 mg | ORAL_TABLET | Freq: Four times a day (QID) | ORAL | 0 refills | Status: DC | PRN

## 2018-09-30 MED ORDER — POLYETHYLENE GLYCOL 3350 17 G PO PACK: 17.0000 g | PACK | Freq: Every day | ORAL | Status: DC | PRN

## 2018-09-30 MED ORDER — METOCLOPRAMIDE HCL 5 MG PO TABS: 5.0000 mg | ORAL_TABLET | Freq: Three times a day (TID) | ORAL | Status: DC | PRN

## 2018-09-30 MED ORDER — ACETAMINOPHEN 325 MG PO TABS
325.0000 mg | ORAL_TABLET | Freq: Four times a day (QID) | ORAL | Status: DC | PRN
  Administered 2018-10-01 – 2018-10-02 (×2): 650 mg via ORAL
  Filled 2018-09-30 (×2): qty 2

## 2018-09-30 MED ORDER — METOCLOPRAMIDE HCL 5 MG/ML IJ SOLN: 5.0000 mg | Freq: Three times a day (TID) | INTRAMUSCULAR | Status: DC | PRN

## 2018-09-30 MED ORDER — LIDOCAINE 2% (20 MG/ML) 5 ML SYRINGE
INTRAMUSCULAR | Status: DC | PRN
  Administered 2018-09-30: 50 mg via INTRAVENOUS

## 2018-09-30 MED ORDER — BUPIVACAINE-EPINEPHRINE (PF) 0.5% -1:200000 IJ SOLN
INTRAMUSCULAR | Status: DC | PRN
  Administered 2018-09-30: 30 mL via PERINEURAL

## 2018-09-30 MED ORDER — FENOFIBRATE 160 MG PO TABS
160.0000 mg | ORAL_TABLET | Freq: Every day | ORAL | Status: DC
  Administered 2018-09-30 – 2018-10-02 (×3): 160 mg via ORAL
  Filled 2018-09-30 (×3): qty 1

## 2018-09-30 MED ORDER — DOCUSATE SODIUM 100 MG PO CAPS
100.0000 mg | ORAL_CAPSULE | Freq: Two times a day (BID) | ORAL | Status: DC
  Administered 2018-09-30 – 2018-10-02 (×4): 100 mg via ORAL
  Filled 2018-09-30 (×4): qty 1

## 2018-09-30 MED ORDER — VITAMIN C 500 MG PO TABS
1000.0000 mg | ORAL_TABLET | Freq: Every day | ORAL | Status: DC
  Administered 2018-10-01 – 2018-10-02 (×2): 1000 mg via ORAL
  Filled 2018-09-30 (×2): qty 2

## 2018-09-30 MED ORDER — EZETIMIBE 10 MG PO TABS
10.0000 mg | ORAL_TABLET | Freq: Every day | ORAL | Status: DC
  Administered 2018-09-30 – 2018-10-02 (×3): 10 mg via ORAL
  Filled 2018-09-30 (×3): qty 1

## 2018-09-30 MED ORDER — MIDAZOLAM HCL 2 MG/2ML IJ SOLN
INTRAMUSCULAR | Status: AC
  Filled 2018-09-30: qty 2

## 2018-09-30 MED ORDER — BUPROPION HCL ER (XL) 150 MG PO TB24
150.0000 mg | ORAL_TABLET | Freq: Every day | ORAL | Status: DC
  Administered 2018-09-30 – 2018-10-02 (×3): 150 mg via ORAL
  Filled 2018-09-30 (×3): qty 1

## 2018-09-30 MED ORDER — BUPIVACAINE LIPOSOME 1.3 % IJ SUSP
INTRAMUSCULAR | Status: DC | PRN
  Administered 2018-09-30: 20 mL

## 2018-09-30 MED ORDER — ONDANSETRON HCL 4 MG/2ML IJ SOLN
INTRAMUSCULAR | Status: AC
  Filled 2018-09-30: qty 2

## 2018-09-30 MED ORDER — METHOCARBAMOL 500 MG PO TABS
500.0000 mg | ORAL_TABLET | Freq: Four times a day (QID) | ORAL | Status: DC | PRN
  Administered 2018-09-30 – 2018-10-01 (×4): 500 mg via ORAL
  Filled 2018-09-30 (×4): qty 1

## 2018-09-30 MED ORDER — FENTANYL CITRATE (PF) 100 MCG/2ML IJ SOLN
INTRAMUSCULAR | Status: AC
  Filled 2018-09-30: qty 2

## 2018-09-30 MED ORDER — METHOCARBAMOL 500 MG IVPB - SIMPLE MED
500.0000 mg | Freq: Four times a day (QID) | INTRAVENOUS | Status: DC | PRN
  Filled 2018-09-30: qty 50

## 2018-09-30 MED ORDER — PANTOPRAZOLE SODIUM 40 MG PO TBEC
40.0000 mg | DELAYED_RELEASE_TABLET | Freq: Every day | ORAL | Status: DC
  Administered 2018-10-01 – 2018-10-02 (×2): 40 mg via ORAL
  Filled 2018-09-30 (×2): qty 1

## 2018-09-30 MED ORDER — TRANEXAMIC ACID-NACL 1000-0.7 MG/100ML-% IV SOLN
1000.0000 mg | INTRAVENOUS | Status: AC
  Administered 2018-09-30: 1000 mg via INTRAVENOUS
  Filled 2018-09-30: qty 100

## 2018-09-30 MED ORDER — ZINC GLUCONATE 50 MG PO TABS: 50.0000 mg | ORAL_TABLET | Freq: Every day | ORAL | Status: DC

## 2018-09-30 MED ORDER — PROPOFOL 500 MG/50ML IV EMUL
INTRAVENOUS | Status: DC | PRN
  Administered 2018-09-30: 75 ug/kg/min via INTRAVENOUS

## 2018-09-30 MED ORDER — ATENOLOL 50 MG PO TABS
100.0000 mg | ORAL_TABLET | Freq: Every day | ORAL | Status: DC
  Administered 2018-10-01: 22:00:00 100 mg via ORAL
  Filled 2018-09-30 (×2): qty 2

## 2018-09-30 MED ORDER — SODIUM CHLORIDE (PF) 0.9 % IJ SOLN
INTRAMUSCULAR | Status: DC | PRN
  Administered 2018-09-30: 70 mL

## 2018-09-30 MED ORDER — MIDAZOLAM HCL 2 MG/2ML IJ SOLN
1.0000 mg | INTRAMUSCULAR | Status: DC
  Administered 2018-09-30: 2 mg via INTRAVENOUS
  Filled 2018-09-30: qty 2

## 2018-09-30 MED ORDER — OXYCODONE-ACETAMINOPHEN 5-325 MG PO TABS: 1.0000 | ORAL_TABLET | ORAL | 0 refills | Status: DC | PRN

## 2018-09-30 MED ORDER — DEXAMETHASONE SODIUM PHOSPHATE 10 MG/ML IJ SOLN
INTRAMUSCULAR | Status: AC
  Filled 2018-09-30: qty 1

## 2018-09-30 MED ORDER — FLUTICASONE PROPIONATE 50 MCG/ACT NA SUSP
1.0000 | Freq: Every day | NASAL | Status: DC | PRN
  Filled 2018-09-30: qty 16

## 2018-09-30 MED ORDER — BUPIVACAINE-EPINEPHRINE (PF) 0.25% -1:200000 IJ SOLN
INTRAMUSCULAR | Status: DC | PRN
  Administered 2018-09-30: 30 mL

## 2018-09-30 MED ORDER — ROSUVASTATIN CALCIUM 20 MG PO TABS
20.0000 mg | ORAL_TABLET | ORAL | Status: DC
  Administered 2018-10-01: 20 mg via ORAL
  Filled 2018-09-30: qty 1

## 2018-09-30 MED ORDER — POVIDONE-IODINE 10 % EX SWAB
2.0000 "application " | Freq: Once | CUTANEOUS | Status: AC
  Administered 2018-09-30: 2 via TOPICAL

## 2018-09-30 MED ORDER — ALUM & MAG HYDROXIDE-SIMETH 200-200-20 MG/5ML PO SUSP: 30.0000 mL | ORAL | Status: DC | PRN

## 2018-09-30 MED ORDER — KCL IN DEXTROSE-NACL 20-5-0.45 MEQ/L-%-% IV SOLN
INTRAVENOUS | Status: DC
  Administered 2018-09-30: 15:00:00 via INTRAVENOUS
  Filled 2018-09-30 (×2): qty 1000

## 2018-09-30 MED ORDER — GABAPENTIN 300 MG PO CAPS
300.0000 mg | ORAL_CAPSULE | Freq: Three times a day (TID) | ORAL | Status: DC
  Administered 2018-09-30 – 2018-10-01 (×4): 300 mg via ORAL
  Filled 2018-09-30 (×4): qty 1

## 2018-09-30 MED ORDER — EPHEDRINE SULFATE-NACL 50-0.9 MG/10ML-% IV SOSY
PREFILLED_SYRINGE | INTRAVENOUS | Status: DC | PRN
  Administered 2018-09-30 (×3): 10 mg via INTRAVENOUS

## 2018-09-30 MED ORDER — DIPHENHYDRAMINE HCL 12.5 MG/5ML PO ELIX: 12.5000 mg | ORAL_SOLUTION | ORAL | Status: DC | PRN

## 2018-09-30 MED ORDER — OXYCODONE HCL 5 MG PO TABS
5.0000 mg | ORAL_TABLET | ORAL | Status: DC | PRN
  Administered 2018-09-30: 10 mg via ORAL
  Administered 2018-09-30: 5 mg via ORAL
  Administered 2018-10-01 (×4): 10 mg via ORAL
  Filled 2018-09-30 (×5): qty 2
  Filled 2018-09-30: qty 1

## 2018-09-30 MED ORDER — MENTHOL 3 MG MT LOZG: 1.0000 | LOZENGE | OROMUCOSAL | Status: DC | PRN

## 2018-09-30 MED ORDER — DEXAMETHASONE SODIUM PHOSPHATE 10 MG/ML IJ SOLN
INTRAMUSCULAR | Status: DC | PRN
  Administered 2018-09-30: 10 mg via INTRAVENOUS

## 2018-09-30 MED ORDER — SODIUM CHLORIDE (PF) 0.9 % IJ SOLN
INTRAMUSCULAR | Status: AC
  Filled 2018-09-30: qty 20

## 2018-09-30 MED ORDER — ONDANSETRON HCL 4 MG/2ML IJ SOLN: 4.0000 mg | Freq: Four times a day (QID) | INTRAMUSCULAR | Status: DC | PRN

## 2018-09-30 SURGICAL SUPPLY — 52 items
AUGMENT DISTAL FEM SZ6 4 KNEE (Miscellaneous) ×2 IMPLANT
AUGMENT POST FEM SZ6 4 KNEE (Miscellaneous) ×4 IMPLANT
BAG DECANTER FOR FLEXI CONT (MISCELLANEOUS) ×2 IMPLANT
BAG ZIPLOCK 12X15 (MISCELLANEOUS) ×2 IMPLANT
BLADE SAG 18X100X1.27 (BLADE) ×2 IMPLANT
BLADE SAW SGTL 11.0X1.19X90.0M (BLADE) ×2 IMPLANT
BLADE SURG SZ10 CARB STEEL (BLADE) ×4 IMPLANT
BNDG ELASTIC 6X10 VLCR STRL LF (GAUZE/BANDAGES/DRESSINGS) ×2 IMPLANT
BNDG ELASTIC 6X15 VLCR STRL LF (GAUZE/BANDAGES/DRESSINGS) ×2 IMPLANT
BOWL SMART MIX CTS (DISPOSABLE) ×2 IMPLANT
CEMENT HV SMART SET (Cement) ×4 IMPLANT
COMP FEM ATTUNE CRS SZ6 LT (Femur) ×2 IMPLANT
COMPONENT FEM ATN CRS SZ6 LT (Femur) ×1 IMPLANT
COVER SURGICAL LIGHT HANDLE (MISCELLANEOUS) ×2 IMPLANT
COVER WAND RF STERILE (DRAPES) IMPLANT
CUFF TOURN SGL QUICK 34 (TOURNIQUET CUFF) ×1
CUFF TRNQT CYL 34X4.125X (TOURNIQUET CUFF) ×1 IMPLANT
DECANTER SPIKE VIAL GLASS SM (MISCELLANEOUS) IMPLANT
DRAPE U-SHAPE 47X51 STRL (DRAPES) ×2 IMPLANT
DRSG AQUACEL AG ADV 3.5X10 (GAUZE/BANDAGES/DRESSINGS) ×2 IMPLANT
DURAPREP 26ML APPLICATOR (WOUND CARE) ×2 IMPLANT
ELECT REM PT RETURN 15FT ADLT (MISCELLANEOUS) ×2 IMPLANT
GLOVE BIO SURGEON STRL SZ7.5 (GLOVE) ×2 IMPLANT
GLOVE BIO SURGEON STRL SZ8.5 (GLOVE) ×2 IMPLANT
GLOVE BIOGEL PI IND STRL 8 (GLOVE) ×1 IMPLANT
GLOVE BIOGEL PI IND STRL 9 (GLOVE) ×1 IMPLANT
GLOVE BIOGEL PI INDICATOR 8 (GLOVE) ×1
GLOVE BIOGEL PI INDICATOR 9 (GLOVE) ×1
GOWN STRL REUS W/TWL XL LVL3 (GOWN DISPOSABLE) ×4 IMPLANT
HANDPIECE INTERPULSE COAX TIP (DISPOSABLE) ×1
HOOD PEEL AWAY FLYTE STAYCOOL (MISCELLANEOUS) ×6 IMPLANT
INSERT ROTATNG PLT SZ6 6 KNEE (Miscellaneous) ×2 IMPLANT
INSERT TIB CMT ATTUNE RP SZ8 (Knees) ×2 IMPLANT
KIT TURNOVER KIT A (KITS) ×2 IMPLANT
NEEDLE HYPO 21X1.5 SAFETY (NEEDLE) ×4 IMPLANT
NS IRRIG 1000ML POUR BTL (IV SOLUTION) ×2 IMPLANT
PACK ICE MAXI GEL EZY WRAP (MISCELLANEOUS) ×2 IMPLANT
PACK TOTAL KNEE CUSTOM (KITS) ×2 IMPLANT
PIN STEINMAN FIXATION KNEE (PIN) ×2 IMPLANT
PROTECTOR NERVE ULNAR (MISCELLANEOUS) ×2 IMPLANT
SET HNDPC FAN SPRY TIP SCT (DISPOSABLE) ×1 IMPLANT
SPONGE LAP 18X18 RF (DISPOSABLE) ×4 IMPLANT
STEM KNEE ATTUNE 12X110MM (Stem) ×2 IMPLANT
STEM STR ATTUNE PF 16X160 (Knees) ×2 IMPLANT
SUT VIC AB 1 CTX 36 (SUTURE) ×1
SUT VIC AB 1 CTX36XBRD ANBCTR (SUTURE) ×1 IMPLANT
SUT VIC AB 3-0 CT1 27 (SUTURE) ×3
SUT VIC AB 3-0 CT1 TAPERPNT 27 (SUTURE) ×3 IMPLANT
SYR CONTROL 10ML LL (SYRINGE) ×4 IMPLANT
TRAY FOLEY MTR SLVR 16FR STAT (SET/KITS/TRAYS/PACK) ×2 IMPLANT
WATER STERILE IRR 1000ML POUR (IV SOLUTION) ×4 IMPLANT
YANKAUER SUCT BULB TIP 10FT TU (MISCELLANEOUS) ×2 IMPLANT

## 2018-09-30 NOTE — Discharge Instructions (Signed)

## 2018-09-30 NOTE — Interval H&P Note (Signed)
History and Physical Interval Note:  09/30/2018 7:09 AM  Jermaine Stewart  has presented today for surgery, with the diagnosis of Loose Left Knee Arthroplasty.  The various methods of treatment have been discussed with the patient and family. After consideration of risks, benefits and other options for treatment, the patient has consented to  Procedure(s): Revision Left Knee Arthroplasty (Left) as a surgical intervention.  The patient's history has been reviewed, patient examined, no change in status, stable for surgery.  I have reviewed the patient's chart and labs.  Questions were answered to the patient's satisfaction.     Kerin Salen

## 2018-09-30 NOTE — Anesthesia Procedure Notes (Signed)
Spinal  Patient location during procedure: OR Start time: 09/30/2018 9:15 AM End time: 09/30/2018 9:20 AM Staffing Anesthesiologist: Josephine Igo, MD Performed: anesthesiologist  Preanesthetic Checklist Completed: patient identified, site marked, surgical consent, pre-op evaluation, timeout performed, IV checked, risks and benefits discussed and monitors and equipment checked Spinal Block Patient position: sitting Prep: site prepped and draped and DuraPrep Patient monitoring: heart rate, cardiac monitor, continuous pulse ox and blood pressure Approach: midline Location: L3-4 Injection technique: single-shot Needle Needle type: Sprotte  Needle gauge: 24 G Needle length: 9 cm Needle insertion depth: 7 cm Assessment Sensory level: T6 Additional Notes Patient tolerated procedure well. Adequate sensory level.

## 2018-09-30 NOTE — Transfer of Care (Signed)
Immediate Anesthesia Transfer of Care Note  Patient: Jermaine Stewart  Procedure(s) Performed: Revision Left Knee Arthroplasty (Left )  Patient Location: PACU  Anesthesia Type:Regional and Spinal  Level of Consciousness: awake, alert  and oriented  Airway & Oxygen Therapy: Patient Spontanous Breathing and Patient connected to nasal cannula oxygen  Post-op Assessment: Report given to RN and Post -op Vital signs reviewed and stable  Post vital signs: Reviewed and stable  Last Vitals:  Vitals Value Taken Time  BP 125/83 09/30/18 1223  Temp    Pulse 80 09/30/18 1226  Resp 17 09/30/18 1226  SpO2 100 % 09/30/18 1226  Vitals shown include unvalidated device data.  Last Pain:  Vitals:   09/30/18 0652  TempSrc: Oral         Complications: No apparent anesthesia complications

## 2018-09-30 NOTE — Op Note (Signed)
PATIENT ID:      Jermaine Stewart  MRN:     [id number] DOB/AGE:    Feb 06, 1961 / 58 y.o.       OPERATIVE REPORT    DATE OF PROCEDURE:  09/30/2018       PREOPERATIVE DIAGNOSIS:   Loose Left Knee Arthroplasty      Estimated body mass index is 23.89 kg/m as calculated from the following:   Height as of 09/25/18: 5\' 10"  (1.778 m).   Weight as of 09/25/18: 75.5 kg.                                                        POSTOPERATIVE DIAGNOSIS:   Loose L TKA Attune Femur                                                                     PROCEDURE:  Procedure(s): Revision Left Knee Arthroplasty Using DepuyAttune Revision 6L femur, 4mm post and distal medial augments, 16x160 fem stem, 8 Rev tibia, 110x12 tib stem, 6mm high post bearing   SURGEON: Jermaine Stewart    ASSISTANT:   Tomi LikensEric K. Gaylene BrooksPhillips Stewart   (Present and scrubbed throughout the case, critical for assistance with exposure, retraction, instrumentation, and closure.)         ANESTHESIA: Spinal, 20cc Exparel, 50cc 0.25% Marcaine  EBL: 650 cc  FLUID REPLACEMENT: 2000 cc crystaloid  Tourniquet Time: None  Drains: None  Tranexamic Acid: 1gm IV, 2gm topical  Exparel: 266mg    COMPLICATIONS:  None         INDICATIONS FOR PROCEDURE: The patient has  S/P Cemented Attune L TKA 2015, had a couple of falls last year. Progressive pain and varus deformity. XR shows femoral loosening, positive bone scan.  Risks and benefits of surgery have been discussed, questions answered.   DESCRIPTION OF PROCEDURE: The patient identified by armband, received  IV antibiotics, [facility] Patient taken to the operating room, appropriate anesthetic monitors were attached, and Spinal anesthesia was  induced. IV Tranexamic acid was given.Tourniquet applied high to the operative thigh. Lateral post and foot positioner applied to the table, the lower extremity was then prepped and draped in usual sterile fashion from the toes to the  tourniquet. Time-out procedure was performed. The skin and subcutaneous tissue along the incision was injected with 20 cc of a mixture of Exparel and Marcaine solution, using a 20-gauge by 1-1/2 inch needle. We began the operation, with the knee flexed 110 degrees, by making the anterior midline incision starting at handbreadth above the patella going over the patella 1 cm medial to and 4 cm distal to the tibial tubercle. Small bleeders in the skin and the subcutaneous tissue identified and cauterized. Transverse retinaculum was incised and reflected medially and a medial parapatellar arthrotomy was accomplished.  We then elevated the superficial medial collateral ligament off the proximal flare of the tibia and remove scar tissue from the anterior aspect of the tibia above the patellar tendon insertion.  This allowed Jermaine Stewart to sublux the patella laterally hyperflexing the  knee exposing the femur which was grossly loose and literally came out in her hand.  There was significant collapse of the medial femoral condyle about 8 to 9 mm in total large amounts of soft tissue on the remaining bone.  We set about removing the fibrous membrane some was sent off to the lab for culture and Gram stain.  There was no obvious purulence.  We then directed our attention to the tibia and tested with a small metal cylinder and a mallet and unfortunately it popped off almost immediately.  The tibial implant was removed and once again we removed bone and fibrous membrane from the proximal tibia.  As we continue to remove scar tissue were able to evert the patella with the knee hyperflexed giving [place] excellent exposure of the tibia which was then reamed with the axial reamers up to the appropriate depth for a 110 mm stem 12 mm in diameter.  With the axial reamer in place we then applied the proximal tibial cutting guide and removed 1 to 2 mm of the proximal tibia bone and cement getting [place] down to healthy tissue.  We then sized for a #8  tibial baseplate and assembled onto a 110 mm x 12 mm tibial trial stem and inserted into the tibia.  We then directed our attention to the distal femur in a similar manner reamed up to 16 mm to the appropriate depth for a 160 mm stem on the femoral implant.  With a reamer in place and then performed a 1 to 2 mm distal femoral cut and applied the cutting guide for a #6 femur which is the size that is been removed.  We essentially cut no bone from the anterior posterior femur and the posterior femur would obviously handle a couple of 4 mm augments as were the distal medial femur.  We then applied the box cutting guide and performed our box cut.  In a similar manner a trial #6 left femur was assembled onto a 160 x 16 mm stem with a medial 4 mm augment and to posterior 4 mm augments.  This was then placed into the distal femur followed by a 6 mm high post RP trial bearing the knee was reduced and taken through range of motion excellent stability was noted in flexion extension and the patella which has been stable tracked nicely.  At this point the limb was wrapped with an Esmarch bandage and the tourniquet was inflated to 300 mmHg with the knee flexed.  All trial implants were removed.  All bony surfaces were WaterPik cleaned and dried with suction and sponges.  Exparel was injected into the periarticular tissues medially laterally and posteriorly.  At the back table we assembled the final implants a 6 left attune revision femur with a 160 mm x 16 mm stem and a medial 4 mm augment and to posterior 4 mm augments.  On the tibial side a #8 tibial baseplate with a 810 mm x 12 mm stem.  A double batch of DePuy HV cement was then mixed and applied to the bony surfaces of the tibia and femur into the proximal surfaces of the tibial implant and the distal surfaces of the femoral implant the tibial implant was hammered into place and excess cement removed as well as the femoral implant.  6 mm bearing was inserted the knee was  placed at 30 degrees flexion with compression as the cement hardened.  The tourniquet was let down small bleeders were identified and  cauterized Exparel was further injected into the soft tissues.  [facility].  The parapatellar arthrotomy was closed with running #1 Vicryl suture in 2 layers.  The subcutaneous tissue with running 2-0 Vicryl suture in running subcuticular 3-0 Vicryl suture.  A dressing of Aquacel and an Ace wrap was then applied.  The patient was taken to recovery room without difficulty.   Jermaine LewandowskyFrank J Leston Schueller 09/30/2018, 7:10 AM

## 2018-09-30 NOTE — Evaluation (Signed)
Physical Therapy Evaluation Patient Details Name: Jermaine NordmannJeffrey D Weissmann MRN: [id number] DOB: 05-Mar-1960 Today's Date: 09/30/2018   History of Present Illness  58 yo male s/p L TKR revision on 09/30/18. PMH includes L and R TKR, radiculopathy with lumbar fusion 03/2018, anemia, HTN, HLD.  Clinical Impression   Pt presents with L knee pain, LLE weakness post-operatively, increased time and effort to perform mobility tasks, and decreased activity tolerance due to LLE weakness and pain. Pt to benefit from acute PT to address deficits. Pt ambulated hallway distance with RW with min guard assist to min assist for LLE guarding/knee extension facilitation, verbal cuing for form and safety provided throughout. Pt educated on ankle pumps (20/hour) to perform this afternoon/evening to increase circulation, to pt's tolerance and limited by pain. PT to progress mobility as tolerated, and will continue to follow acutely.        Follow Up Recommendations Follow surgeon's recommendation for DC plan and follow-up therapies;Supervision for mobility/OOB    Equipment Recommendations  None recommended by PT    Recommendations for Other Services       Precautions / Restrictions Precautions Precautions: Fall Restrictions Weight Bearing Restrictions: No LLE Weight Bearing: Weight bearing as tolerated      Mobility  Bed Mobility Overal bed mobility: Needs Assistance Bed Mobility: Supine to Sit     Supine to sit: HOB elevated;Min guard     General bed mobility comments: min guard for safety, increased time and effort to perform. Verbal cuing for sequencing to EOB.  Transfers Overall transfer level: Needs assistance Equipment used: Rolling walker (2 wheeled) Transfers: Sit to/from Stand Sit to Stand: From elevated surface;Min guard         General transfer comment: Min guard for safety, verbal cuing for hand placement when rising.  Ambulation/Gait Ambulation/Gait assistance: Min assist Gait  Distance (Feet): 50 Feet Assistive device: Rolling walker (2 wheeled) Gait Pattern/deviations: Step-to pattern;Decreased stance time - left;Decreased weight shift to left;Decreased step length - left;Decreased step length - right;Antalgic Gait velocity: decr   General Gait Details: Min assist for steadying, guarding LLE. Pt with mild LLE buckling, corrected by PT knee extension facilitation as well as pt decreased WB on LLE. Wife with follow if +2 assist needed. Verbal cuing for sequencing, decreasing LLE WB with RW use, placement in RW.  Stairs            Wheelchair Mobility    Modified Rankin (Stroke Patients Only)       Balance Overall balance assessment: Mild deficits observed, not formally tested                                           Pertinent Vitals/Pain Pain Assessment: 0-10 Pain Score: 3  Pain Location: L knee Pain Descriptors / Indicators: Sore Pain Intervention(s): Monitored during session;Limited activity within patient's tolerance;Repositioned;Ice applied;RN gave pain meds during session    Home Living Family/patient expects to be discharged to:: Private residence Living Arrangements: Spouse/significant other Available Help at Discharge: Family Type of Home: House Home Access: Stairs to enter   Secretary/administratorntrance Stairs-Number of Steps: 2 Home Layout: One level Home Equipment: Environmental consultantWalker - 2 wheels;Bedside commode;Cane - single point      Prior Function Level of Independence: Independent         Comments: pt works for Public house manager[employer]     Hand Dominance   Dominant Hand: Right  Extremity/Trunk Assessment   Upper Extremity Assessment Upper Extremity Assessment: Overall WFL for tasks assessed    Lower Extremity Assessment Lower Extremity Assessment: Overall WFL for tasks assessed;LLE deficits/detail LLE Deficits / Details: suspected post-surgical weakness; able to perform ankle pumps, quad set, knee flexion to 70* sitting EOB,  and SLR with min lift assist LLE Sensation: WNL    Cervical / Trunk Assessment Cervical / Trunk Assessment: Normal  Communication   Communication: No difficulties  Cognition Arousal/Alertness: Awake/alert Behavior During Therapy: WFL for tasks assessed/performed Overall Cognitive Status: Within Functional Limits for tasks assessed                                        General Comments      Exercises     Assessment/Plan    PT Assessment Patient needs continued PT services  PT Problem List Decreased strength;Decreased mobility;Decreased range of motion;Decreased activity tolerance;Decreased balance;Decreased knowledge of use of DME;Pain       PT Treatment Interventions DME instruction;Therapeutic activities;Gait training;Therapeutic exercise;Patient/family education;Balance training;Stair training;Functional mobility training    PT Goals (Current goals can be found in the Care Plan section)  Acute Rehab PT Goals Patient Stated Goal: go home asap PT Goal Formulation: With patient Time For Goal Achievement: 10/07/18 Potential to Achieve Goals: Good    Frequency 7X/week   Barriers to discharge        Co-evaluation               AM-PAC PT "6 Clicks" Mobility  Outcome Measure Help needed turning from your back to your side while in a flat bed without using bedrails?: A Little Help needed moving from lying on your back to sitting on the side of a flat bed without using bedrails?: A Little Help needed moving to and from a bed to a chair (including a wheelchair)?: A Little Help needed standing up from a chair using your arms (e.g., wheelchair or bedside chair)?: A Little Help needed to walk in hospital room?: A Little Help needed climbing 3-5 steps with a railing? : A Lot 6 Click Score: 17    End of Session Equipment Utilized During Treatment: Gait belt Activity Tolerance: Patient tolerated treatment well;Patient limited by fatigue Patient left:  in chair;with call bell/phone within reach;with chair alarm set;with SCD's reapplied;with family/visitor present Nurse Communication: Mobility status PT Visit Diagnosis: Other abnormalities of gait and mobility (R26.89);Difficulty in walking, not elsewhere classified (R26.2)    Time: 1600-1620 PT Time Calculation (min) (ACUTE ONLY): 20 min   Charges:   PT Evaluation $PT Eval Low Complexity: 1 Low         Abella Shugart Conception Chancy, PT Acute Rehabilitation Services Pager [phone]  Office [phone]  Roxine Caddy D Elonda Husky 09/30/2018, 4:35 PM

## 2018-09-30 NOTE — Anesthesia Postprocedure Evaluation (Signed)
Anesthesia Post Note  Patient: Jermaine Stewart  Procedure(s) Performed: Revision Left Knee Arthroplasty (Left )     Patient location during evaluation: PACU Anesthesia Type: Spinal Level of consciousness: oriented and awake and alert Pain management: pain level controlled Vital Signs Assessment: post-procedure vital signs reviewed and stable Respiratory status: spontaneous breathing, respiratory function stable and nonlabored ventilation Cardiovascular status: blood pressure returned to baseline and stable Postop Assessment: no headache, no backache, no apparent nausea or vomiting, spinal receding and patient able to bend at knees Anesthetic complications: no    Last Vitals:  Vitals:   09/30/18 1300 09/30/18 1315  BP: 124/77 125/83  Pulse: 75 65  Resp: 14 15  Temp:  36.7 C  SpO2: 100% 100%    Last Pain:  Vitals:   09/30/18 1315  TempSrc:   PainSc: 0-No pain                 [name]

## 2018-09-30 NOTE — Anesthesia Procedure Notes (Signed)
Anesthesia Regional Block: Adductor canal block   Pre-Anesthetic Checklist: ,, timeout performed, Correct Patient, Correct Site, Correct Laterality, Correct Procedure, Correct Position, site marked, Risks and benefits discussed,  Surgical consent,  Pre-op evaluation,  At surgeon's request and post-op pain management  Laterality: Left  Prep: chloraprep       Needles:  Injection technique: Single-shot  Needle Type: Echogenic Stimulator Needle     Needle Length: 9cm  Needle Gauge: 21   Needle insertion depth: 7 cm   Additional Needles:   Procedures:,,,, ultrasound used (permanent image in chart),,,,  Narrative:  Start time: 09/30/2018 8:37 AM End time: 09/30/2018 8:42 AM Injection made incrementally with aspirations every 5 mL.  Performed by: Personally  Anesthesiologist: Josephine Igo, MD  Additional Notes: Timeout performed. Patient sedated. Relevant anatomy ID'd using [place]. Incremental 2-68ml injection of LA with frequent aspiration. Patient tolerated procedure well.        Left Adductor Canal Block

## 2018-09-30 NOTE — Progress Notes (Signed)
AssistedDr. Foster with left, ultrasound guided, adductor canal block. Side rails up, monitors on throughout procedure. See vital signs in flow sheet. Tolerated Procedure well.  

## 2018-09-30 NOTE — Progress Notes (Signed)
PHARMACIST - PHYSICIAN ORDER COMMUNICATION  CONCERNING: P&T Medication Policy on Herbal Medications  DESCRIPTION:  This patient's order for:  Zinc Gluconate  has been noted.  This product(s) is classified as an "herbal" or natural product. Due to a lack of definitive safety studies or FDA approval, nonstandard manufacturing practices, plus the potential risk of unknown drug-drug interactions while on inpatient medications, the Pharmacy and Therapeutics Committee does not permit the use of "herbal" or natural products of this type within [facility].   ACTION TAKEN: The pharmacy department is unable to verify this order at this time and your patient has been informed of this safety policy. Please reevaluate patient's clinical condition at discharge and address if the herbal or natural product(s) should be resumed at that time.  Leone Haven, PharmD

## 2018-10-01 DIAGNOSIS — T84033A Mechanical loosening of internal left knee prosthetic joint, initial encounter: Secondary | ICD-10-CM | POA: Diagnosis not present

## 2018-10-01 LAB — CBC
HCT: 32.1 % — ABNORMAL LOW (ref 39.0–52.0)
Hemoglobin: 10 g/dL — ABNORMAL LOW (ref 13.0–17.0)
MCH: 28.2 pg (ref 26.0–34.0)
MCHC: 31.2 g/dL (ref 30.0–36.0)
MCV: 90.4 fL (ref 80.0–100.0)
Platelets: 187 10*3/uL (ref 150–400)
RBC: 3.55 MIL/uL — ABNORMAL LOW (ref 4.22–5.81)
RDW: 15.2 % (ref 11.5–15.5)
WBC: 7 10*3/uL (ref 4.0–10.5)
nRBC: 0 % (ref 0.0–0.2)

## 2018-10-01 LAB — BASIC METABOLIC PANEL
Anion gap: 8 (ref 5–15)
BUN: 9 mg/dL (ref 6–20)
CO2: 25 mmol/L (ref 22–32)
Calcium: 8.3 mg/dL — ABNORMAL LOW (ref 8.9–10.3)
Chloride: 98 mmol/L (ref 98–111)
Creatinine, Ser: 0.86 mg/dL (ref 0.61–1.24)
GFR calc Af Amer: >60 mL/min (ref >60)
GFR calc non Af Amer: >60 mL/min (ref >60)
Glucose, Bld: 154 mg/dL — ABNORMAL HIGH (ref 70–99)
Potassium: 4.3 mmol/L (ref 3.5–5.1)
Sodium: 131 mmol/L — ABNORMAL LOW (ref 135–145)

## 2018-10-01 MED ORDER — LORAZEPAM 1 MG PO TABS
1.0000 mg | ORAL_TABLET | Freq: Four times a day (QID) | ORAL | Status: DC | PRN
  Administered 2018-10-01 – 2018-10-02 (×3): 1 mg via ORAL
  Filled 2018-10-01 (×3): qty 1

## 2018-10-01 NOTE — Progress Notes (Addendum)
Therapy Notes-[facility] Has DME (3 in1 and RW)

## 2018-10-01 NOTE — Progress Notes (Signed)
PATIENT ID: Jermaine Stewart  MRN: [id number]  DOB/AGE:  1960/07/14 / 58 y.o.  1 Day Post-Op Procedure(s) (LRB): Revision Left Knee Arthroplasty (Left)    PROGRESS NOTE Subjective: Patient is alert, oriented, no Nausea, no Vomiting, yes passing gas. Taking PO well. Denies SOB, Chest or Calf Pain. Using Incentive Spirometer, PAS in place. Ambulate 50', Patient reports pain as 2/10 .    Objective: Vital signs in last 24 hours: Vitals:   09/30/18 1705 09/30/18 2026 10/01/18 0142 10/01/18 0546  BP: 124/77 137/86 111/74 130/76  Pulse: 68 65 (Abnormal) 56 (Abnormal) 47  Resp: 16 16 16 16   Temp: 97.8 F (36.6 C) 97.7 F (36.5 C) (Abnormal) 97.5 F (36.4 C) 97.6 F (36.4 C)  TempSrc:      SpO2: 100% 100% 100% 100%  Weight:      Height:          Intake/Output from previous day: I/O last 3 completed shifts: In: 5397.5 [P.O.:720; I.V.:4577.5; IV Piggyback:100] Out: 8250 [Urine:4750; Blood:600]   Intake/Output this shift: No intake/output data recorded.   LABORATORY DATA: Recent Labs    10/01/18 0251  WBC 7.0  HGB 10.0*  HCT 32.1*  PLT 187  NA 131*  K 4.3  CL 98  CO2 25  BUN 9  CREATININE 0.86  GLUCOSE 154*  CALCIUM 8.3*    Examination: Neurologically intact ABD soft Neurovascular intact Sensation intact distally Intact pulses distally Dorsiflexion/Plantar flexion intact Incision: dressing C/D/I No cellulitis present Compartment soft}  Assessment:   1 Day Post-Op Procedure(s) (LRB): Revision Left Knee Arthroplasty (Left) ADDITIONAL DIAGNOSIS: Expected Acute Blood Loss Anemia, Hypertension  Patient's anticipated LOS is less than 2 midnights, meeting these requirements: - Younger than 41 - Lives within 1 hour of care - Has a competent adult at home to recover with post-op recover - NO history of  - Chronic pain requiring opiods  - Diabetes  - Coronary Artery Disease  - Heart failure  - Heart attack  - Stroke  - DVT/VTE  - Cardiac arrhythmia  -  Respiratory Failure/COPD  - Renal failure  - Anemia  - Advanced Liver disease       Plan: PT/OT WBAT, AROM and PROM  DVT Prophylaxis:  SCDx72hrs, ASA 81 mg BID x 2 weeks DISCHARGE PLAN: Home if passes PT today DISCHARGE NEEDS: HHPT, Walker and 3-in-1 comode seat     Kerin Salen 10/01/2018, 7:32 AM Patient ID: Jermaine Stewart, male   DOB: 1960/07/31, 58 y.o.   MRN: [id number]

## 2018-10-01 NOTE — Progress Notes (Signed)
RN called to hallway with patient's wife. Patient's wife expressing concern over patient's increasing confusion. RN had not noted any confusion or inappropriate comments during shift. Wife reports that earlier in the day and continuing through to the evening patient would make strange observations, such as believing he is at his work, his wife is a Mudlogger, Social research officer, government. RN went into room to assess patient. Patient had completely appropriate conversation w/ RN, no signs of confusion, answered all questions appropriately. No confusion during casual conversation as well.  During casual conversation, patient's wife mentioned patient's tremors in hands and reported that he shakes like that before his evening alcohol consumption. Patient reported he drinks 5-6 beers every night. Patient denied he might be in withdrawal, believes his confusion and tremors are caused by the gabapentin. Will notify MD and oncoming shift.

## 2018-10-01 NOTE — Discharge Summary (Addendum)
Patient ID: Jermaine Stewart MRN: [id number] DOB/AGE: 09-06-1960 58 y.o.  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 Arthritis   . Depression   . GERD (gastroesophageal reflux disease)   . Hyperlipidemia   . Hypertension     Surgeries: Procedure(s): Revision Left Knee Arthroplasty on 09/30/2018   Consultants:   Discharged Condition: Improved  Hospital Course: Jermaine NordmannJeffrey D Few is an 58 y.o. male who was admitted 09/30/2018 for operative treatment ofLoose left total knee arthroplasty (HCC). Patient has severe unremitting pain that affects sleep, daily activities, and work/hobbies. After pre-op clearance the patient was taken to the operating room on 09/30/2018 and underwent  Procedure(s): Revision Left Knee Arthroplasty.    Patient was given perioperative antibiotics:  Anti-infectives (From admission, onward)   Start     Dose/Rate Route Frequency Ordered Stop   09/30/18 0645  ceFAZolin (ANCEF) IVPB 2g/100 mL premix     2 g 200 mL/hr over 30 Minutes Intravenous On call to O.R. 09/30/18 [id number] 09/30/18 [id number]       Patient was given sequential compression devices, early ambulation, and chemoprophylaxis to prevent DVT.  Patient benefited maximally from hospital stay and there were no complications.    Recent vital signs:  Patient Vitals for the past 24 hrs:  BP Temp Temp src Pulse Resp SpO2 Height Weight  10/01/18 0546 130/76 97.6 F (36.4 C) no documentation (Abnormal) 47 16 100 % no documentation no documentation  10/01/18 0142 111/74 (Abnormal) 97.5 F (36.4 C) no documentation (Abnormal) 56 16 100 % no documentation no documentation  09/30/18 2026 137/86 97.7 F (36.5 C) no documentation 65 16 100 % no documentation no  documentation  09/30/18 1705 124/77 97.8 F (36.6 C) no documentation 68 16 100 % no documentation no documentation  09/30/18 1547 140/89 no documentation no documentation 75 18 100 % no documentation no documentation  09/30/18 1526 no documentation no documentation no documentation no documentation no documentation no documentation 5\' 10"  (1.778 m) 75.5 kg  09/30/18 1456 132/84 97.7 F (36.5 C) no documentation 69 16 100 % no documentation no documentation  09/30/18 1347 (Abnormal) 142/83 97.9 F (36.6 C) Oral 72 16 100 % no documentation no documentation  09/30/18 1315 125/83 98 F (36.7 C) no documentation 65 15 100 % no documentation no documentation  09/30/18 1300 124/77 no documentation no documentation 75 14 100 % no documentation no documentation  09/30/18 1245 132/82 no documentation no documentation 71 12 100 % no documentation no documentation  09/30/18 1230 117/80 no documentation no documentation 76 16 100 % no documentation no documentation  09/30/18 1223 125/83 98 F (36.7 C) no documentation 85 18 99 % no documentation no documentation  09/30/18 0855 139/89 no documentation no documentation 69 (Abnormal) 24 99 % no documentation no documentation  09/30/18 0850 (Abnormal) 143/74 no documentation no documentation 65 14 99 % no documentation no documentation  09/30/18 0845 135/86 no documentation no documentation 67 15 98 % no documentation no documentation  09/30/18 0841 138/80 no documentation no documentation 66 (Abnormal) 24 98 % no documentation no documentation  09/30/18 0833 (Abnormal) 146/80 no documentation no documentation 71 20 98 % no documentation no documentation     Recent laboratory studies:  Recent Labs    10/01/18  0251  WBC 7.0  HGB 10.0*  HCT 32.1*  PLT 187  NA 131*  K 4.3  CL 98  CO2 25  BUN 9  CREATININE 0.86  GLUCOSE 154*  CALCIUM 8.3*     Discharge Medications:   Allergies as of 10/01/2018    Allergen Reactions Comment   Atorvastatin  Other (See Comments) Arthralgias      Medication List    Stop taking these medications   meloxicam 15 MG tablet Commonly known as: MOBIC     Take these medications   aspirin EC 81 MG tablet Take 1 tablet (81 mg total) by mouth 2 (two) times daily.   atenolol 100 MG tablet Commonly known as: Tenormin Take 1 tablet at Bedtime for BP What changed:   how much to take  how to take this  when to take this   buPROPion 150 MG 24 hr tablet Commonly known as: Wellbutrin XL Take 1 tablet every morning for Mood What changed:   how much to take  how to take this  when to take this   ezetimibe 10 MG tablet Commonly known as: ZETIA TAKE 1 TABLET DAILY   fenofibrate 145 MG tablet Commonly known as: TRICOR TAKE 1 TABLET DAILY FOR BLOOD FATS What changed: See the new instructions.   fluticasone 50 MCG/ACT nasal spray Commonly known as: FLONASE Use 1 to Sprays each nares 1 to 2 x /day What changed:   how much to take  how to take this  when to take this  reasons to take this  additional instructions   omeprazole 40 MG capsule Commonly known as: PRILOSEC Take 1 capsule 2 x /day for Acid Indigestion & Reflux What changed:   how much to take  how to take this  when to take this  additional instructions   oxyCODONE-acetaminophen 5-325 MG tablet Commonly known as: PERCOCET/ROXICET Take 1 tablet by mouth every 4 (four) hours as needed for severe pain.   rosuvastatin 20 MG tablet Commonly known as: CRESTOR Take twice a week in the evening for cholesterol. What changed:   how much to take  how to take this  when to take this  additional instructions   tiZANidine 2 MG tablet Commonly known as: ZANAFLEX Take 1 tablet (2 mg total) by mouth every 6 (six) hours as needed.   vitamin C 1000 MG tablet Take 1,000 mg by mouth daily.   Vitamin D-3 125 MCG (5000 UT) Tabs Take 15,000 Units by mouth daily.   zinc gluconate 50 MG tablet Take 50 mg by mouth  daily.        Durable Medical Equipment  (From admission, onward)         Start     Ordered   09/30/18 1344  DME Walker rolling  Once    Question:  Patient needs a walker to treat with the following condition  Answer:  Status post total left knee replacement   09/30/18 1343   09/30/18 1344  DME 3 n 1  Once     09/30/18 1343           Discharge Care Instructions  (From admission, onward)         Start     Ordered   10/01/18 0000  Change dressing    Comments: Change dressing Only if drainage exceeds 40% of window on dressing   10/01/18 0743          Diagnostic Studies: Dg Chest 2 View  Result  Date: 09/26/2018 CLINICAL DATA:  Preop knee surgery EXAM: CHEST - 2 VIEW COMPARISON:  None. FINDINGS: Heart and mediastinal contours are within normal limits. No focal opacities or effusions. No acute bony abnormality. IMPRESSION: No active cardiopulmonary disease. Electronically Signed   By: Rolm Baptise M.D.   On: 09/26/2018 01:16   Dg Knee Left Port  Result Date: 09/30/2018 CLINICAL DATA:  Status post left total knee replacement. EXAM: PORTABLE LEFT KNEE - 1-2 VIEW COMPARISON:  None. FINDINGS: The femoral and tibial components appear to be well situated. Expected postoperative changes are noted in the soft tissues anteriorly. No fracture or dislocation is noted. IMPRESSION: Status post left total knee arthroplasty. Electronically Signed   By: Marijo Conception M.D.   On: 09/30/2018 14:03    Disposition: Discharge disposition: 01-Home or Self Care       Discharge Instructions    Call MD / Call 911   Complete by: As directed    If you experience chest pain or shortness of breath, CALL 911 and be transported to the hospital emergency room.  If you develope a fever above 101 F, pus (white drainage) or increased drainage or redness at the wound, or calf pain, call your surgeon's office.   Change dressing   Complete by: As directed    Change dressing Only if drainage exceeds 40%  of window on dressing   Constipation Prevention   Complete by: As directed    Drink plenty of fluids.  Prune juice may be helpful.  You may use a stool softener, such as Colace (over the counter) 100 mg twice a day.  Use MiraLax (over the counter) for constipation as needed.   Diet - low sodium heart healthy   Complete by: As directed    Increase activity slowly as tolerated   Complete by: As directed       Follow-up Information    Frederik Pear, MD In 2 weeks.   Specialty: Orthopedic Surgery Contact information: [place] [address] [phone]            Signed: Kerin Salen 10/01/2018, 7:44 AM

## 2018-10-01 NOTE — Progress Notes (Signed)
Physical Therapy Treatment Patient Details Name: Jermaine Stewart MRN: [id number] DOB: 1960-05-05 Today's Date: 10/01/2018    History of Present Illness 58 yo male s/p L TKR revision on 09/30/18. PMH includes L and R TKR, radiculopathy with lumbar fusion 03/2018, anemia, HTN, HLD.    PT Comments    POD # 1 am session Pt OOB in recliner.  Pt eager to go home today.  Assisted with transfers.  General transfer comment: Min guard for safety, verbal cuing for hand placement when rising.  Shaky  General Gait Details: unsteady   shaky   tremors throughout   25% VC's to decrease gait speed to decrease tremors (unsuccessful) 25% VC's on proper walker to self distance and safety with turns.  Amb ceased due to bright, red blood dripping in floor and bandage 100% soaked.  Recliner brought to pt and returned to room.  Notified RN.  Assisted with dressing change and applieed compression wrap plus ICE.  Will attempt another PT session later today.    Follow Up Recommendations  Follow surgeon's recommendation for DC plan and follow-up therapies;Supervision for mobility/OOB     Equipment Recommendations  None recommended by PT    Recommendations for Other Services       Precautions / Restrictions Precautions Precautions: Fall Restrictions Weight Bearing Restrictions: No LLE Weight Bearing: Weight bearing as tolerated    Mobility  Bed Mobility               General bed mobility comments: OOB in recliner on arrival  Transfers Overall transfer level: Needs assistance Equipment used: Rolling walker (2 wheeled) Transfers: Sit to/from Stand Sit to Stand: Min assist;Min guard         General transfer comment: Min guard for safety, verbal cuing for hand placement when rising.  Shaky  Ambulation/Gait Ambulation/Gait assistance: Min assist Gait Distance (Feet): 55 Feet Assistive device: Rolling walker (2 wheeled) Gait Pattern/deviations: Step-to pattern;Decreased stance time -  left;Decreased weight shift to left;Decreased step length - left;Decreased step length - right;Antalgic Gait velocity: decreased   General Gait Details: unsteady   shaky   tremors throughout   25% VC's to decrease gait speed to decrease tremors (unsuccessful) 25% VC's on proper walker to self distance and safety with turns.  Amb ceased due to bright, red blood dripping in floor and bandage 100% soaked.  Recliner brought to pt and returned to room.  Notified RN.   Stairs             Wheelchair Mobility    Modified Rankin (Stroke Patients Only)       Balance                                            Cognition Arousal/Alertness: Awake/alert Behavior During Therapy: WFL for tasks assessed/performed Overall Cognitive Status: Within Functional Limits for tasks assessed                                 General Comments: impulsive      Exercises      General Comments        Pertinent Vitals/Pain Pain Assessment: 0-10 Pain Score: 2  Pain Location: L knee Pain Descriptors / Indicators: Dull Pain Intervention(s): Monitored during session;Repositioned;Ice applied    Home Living  Prior Function            PT Goals (current goals can now be found in the care plan section) Progress towards PT goals: Progressing toward goals    Frequency    7X/week      PT Plan Current plan remains appropriate    Co-evaluation              AM-PAC PT "6 Clicks" Mobility   Outcome Measure  Help needed turning from your back to your side while in a flat bed without using bedrails?: A Little Help needed moving from lying on your back to sitting on the side of a flat bed without using bedrails?: A Little Help needed moving to and from a bed to a chair (including a wheelchair)?: A Little Help needed standing up from a chair using your arms (e.g., wheelchair or bedside chair)?: A Little Help needed to walk in  hospital room?: A Little Help needed climbing 3-5 steps with a railing? : A Little 6 Click Score: 18    End of Session Equipment Utilized During Treatment: Gait belt Activity Tolerance: Other (comment)(active bleed from incision, Tx session ceased) Patient left: in chair;with call bell/phone within reach;with chair alarm set;with SCD's reapplied;with family/visitor present Nurse Communication: Mobility status PT Visit Diagnosis: Other abnormalities of gait and mobility (R26.89);Difficulty in walking, not elsewhere classified (R26.2)     Time: 0258-5277 PT Time Calculation (min) (ACUTE ONLY): 26 min  Charges:  $Gait Training: 8-22 mins $Therapeutic Activity: 8-22 mins                     Rica Koyanagi  PTA Acute  Rehabilitation Services Pager      [phone] Office      [phone]

## 2018-10-01 NOTE — Progress Notes (Signed)
Physical Therapy Treatment Patient Details Name: Jermaine Stewart MRN: [id number] DOB: 1960-07-27 Today's Date: 10/01/2018    History of Present Illness 58 yo male s/p L TKR revision on 09/30/18. PMH includes L and R TKR, radiculopathy with lumbar fusion 03/2018, anemia, HTN, HLD.    PT Comments    POD # 1 pm session Pt still in recliner with spouse at bedside.  Pt still eager to go home.  Even more shaky this afternoon.  Tremors. General transfer comment: Min guard for safety, verbal cuing for hand placement when rising.  Shaky.   General Gait Details: even more shaky this afternoon.  Gait unsteady.  Pt stated "this always happens after I have surgery".  Pt mentioned "effects of Anesthesia but per chart review pt had a Spinal.  Also, gait limited due to active bleeding again.  Recliner brought to pt and returned to room.  Notified RN.  Assisted with dressing change, compression wrap and ICE. Pt did NOT meet goals  Physical Therapy to D/C to home today.    Follow Up Recommendations  Follow surgeon's recommendation for DC plan and follow-up therapies;Supervision for mobility/OOB     Equipment Recommendations  None recommended by PT    Recommendations for Other Services       Precautions / Restrictions Precautions Precautions: Fall Restrictions Weight Bearing Restrictions: No LLE Weight Bearing: Weight bearing as tolerated    Mobility  Bed Mobility               General bed mobility comments: OOB in recliner on arrival  Transfers Overall transfer level: Needs assistance Equipment used: Rolling walker (2 wheeled) Transfers: Sit to/from Stand Sit to Stand: Min assist;Min guard         General transfer comment: Min guard for safety, verbal cuing for hand placement when rising.  Shaky  Ambulation/Gait Ambulation/Gait assistance: Min assist Gait Distance (Feet): 18 Feet Assistive device: Rolling walker (2 wheeled) Gait Pattern/deviations: Step-to pattern;Decreased  stance time - left;Decreased weight shift to left;Decreased step length - left;Decreased step length - right;Antalgic Gait velocity: decreased   General Gait Details: even more shaky this afternoon.  Gait unsteady.  Pt stated "this always happens after I have surgery".  Pt mentioned "effects of Anesthesia but per chart review pt had a Spinal.  Also, gait limited due to active bleeding again.  Recliner brought to pt and returned to room.  Notified RN.  Assisted with dressing change, compression wrap and ICE.   Stairs             Wheelchair Mobility    Modified Rankin (Stroke Patients Only)       Balance                                            Cognition Arousal/Alertness: Awake/alert Behavior During Therapy: WFL for tasks assessed/performed Overall Cognitive Status: Within Functional Limits for tasks assessed                                 General Comments: impulsive      Exercises      General Comments        Pertinent Vitals/Pain Pain Assessment: 0-10 Pain Score: 2  Pain Location: L knee Pain Descriptors / Indicators: Dull Pain Intervention(s): Monitored during session;Repositioned;Ice applied    Home  Living                      Prior Function            PT Goals (current goals can now be found in the care plan section) Progress towards PT goals: Progressing toward goals    Frequency    7X/week      PT Plan Current plan remains appropriate    Co-evaluation              AM-PAC PT "6 Clicks" Mobility   Outcome Measure  Help needed turning from your back to your side while in a flat bed without using bedrails?: A Little Help needed moving from lying on your back to sitting on the side of a flat bed without using bedrails?: A Little Help needed moving to and from a bed to a chair (including a wheelchair)?: A Little Help needed standing up from a chair using your arms (e.g., wheelchair or bedside  chair)?: A Little Help needed to walk in hospital room?: A Little Help needed climbing 3-5 steps with a railing? : A Little 6 Click Score: 18    End of Session Equipment Utilized During Treatment: Gait belt Activity Tolerance: Other (comment)(active bleed from incision, Tx session ceased) Patient left: in chair;with call bell/phone within reach;with chair alarm set;with SCD's reapplied;with family/visitor present Nurse Communication: Mobility status PT Visit Diagnosis: Other abnormalities of gait and mobility (R26.89);Difficulty in walking, not elsewhere classified (R26.2)     Time: [id number] PT Time Calculation (min) (ACUTE ONLY): 26 min  Charges:  $Gait Training: 8-22 mins $Therapeutic Activity: 8-22 mins                     Felecia ShellingLori Siler Mavis  PTA Acute  Rehabilitation Services Pager      [phone] Office      [phone]

## 2018-10-01 NOTE — Plan of Care (Signed)
progressing 

## 2018-10-02 DIAGNOSIS — T84033A Mechanical loosening of internal left knee prosthetic joint, initial encounter: Secondary | ICD-10-CM | POA: Diagnosis not present

## 2018-10-02 LAB — CBC
HCT: 28.6 % — ABNORMAL LOW (ref 39.0–52.0)
Hemoglobin: 8.9 g/dL — ABNORMAL LOW (ref 13.0–17.0)
MCH: 28.1 pg (ref 26.0–34.0)
MCHC: 31.1 g/dL (ref 30.0–36.0)
MCV: 90.2 fL (ref 80.0–100.0)
Platelets: 164 10*3/uL (ref 150–400)
RBC: 3.17 MIL/uL — ABNORMAL LOW (ref 4.22–5.81)
RDW: 15.8 % — ABNORMAL HIGH (ref 11.5–15.5)
WBC: 5.9 10*3/uL (ref 4.0–10.5)
nRBC: 0 % (ref 0.0–0.2)

## 2018-10-02 NOTE — Progress Notes (Signed)
Physical Therapy Treatment Patient Details Name: Jermaine Stewart MRN: [id number] DOB: 05-08-1960 Today's Date: 10/02/2018    History of Present Illness 58 yo male s/p L TKR revision on 09/30/18. PMH includes L and R TKR, radiculopathy with lumbar fusion 03/2018, anemia, HTN, HLD.    PT Comments    POD # 2 am session RN reported she just changed dressing as pt is still bleeding.  Started with TE's in bed to see pt would bleed and he did.  RN called to room and assisted with another dressing change.  Dark red blood oozing from distal incision.  RN texting PA/MD in meantime assist to recliner only till further instructions. Pt's behavior/cognition is impaired. General Comments: "under the influence" disoriented and drifts off thinking he is at work.  Can be redirected and follows commands but requires repeat VC's  When I left the room, pt started to talk to his co workers and no one was in room.  Spouse stated "he has been doing that all morning".       Follow Up Recommendations  Follow surgeon's recommendation for DC plan and follow-up therapies;Supervision for mobility/OOB;Home health PT     Equipment Recommendations  None recommended by PT    Recommendations for Other Services       Precautions / Restrictions Precautions Precautions: Fall;Knee Restrictions Weight Bearing Restrictions: No LLE Weight Bearing: Weight bearing as tolerated    Mobility  Bed Mobility Overal bed mobility: Needs Assistance Bed Mobility: Supine to Sit     Supine to sit: Supervision     General bed mobility comments: assist for safety  Transfers Overall transfer level: Needs assistance Equipment used: Rolling walker (2 wheeled) Transfers: Sit to/from Stand Sit to Stand: Min assist;Min guard         General transfer comment: assist for safety  Ambulation/Gait             General Gait Details: transfers only awaiting call back from PA re bleeding   Stairs              Wheelchair Mobility    Modified Rankin (Stroke Patients Only)       Balance                                            Cognition Arousal/Alertness: Suspect due to medications Behavior During Therapy: Restless;Impulsive(impaired, confused) Overall Cognitive Status: Impaired/Different from baseline Area of Impairment: Orientation;Attention;Memory;Following commands;Safety/judgement;Awareness;Problem solving                 Orientation Level: Person Current Attention Level: Alternating Memory: Decreased recall of precautions;Decreased short-term memory Following Commands: Follows one step commands inconsistently Safety/Judgement: Decreased awareness of safety;Decreased awareness of deficits     General Comments: "under the influence" disoriented and drifts off thinking he is at work.  Can be redirected and follows commands but requires repeat VC's      Exercises   Total Knee Replacement TE's 10 reps B LE ankle pumps 10 reps towel squeezes 10 reps knee presses 10 reps heel slides  10 reps SAQ's 10 reps SLR's 10 reps ABD Followed by ICE    General Comments        Pertinent Vitals/Pain Pain Assessment: Faces Faces Pain Scale: No hurt Pain Location: L knee Pain Descriptors / Indicators: Dull Pain Intervention(s): Monitored during session    Home Living  Prior Function            PT Goals (current goals can now be found in the care plan section) Progress towards PT goals: Progressing toward goals    Frequency    7X/week      PT Plan Current plan remains appropriate    Co-evaluation              AM-PAC PT "6 Clicks" Mobility   Outcome Measure  Help needed turning from your back to your side while in a flat bed without using bedrails?: A Little Help needed moving from lying on your back to sitting on the side of a flat bed without using bedrails?: A Little Help needed moving to and from  a bed to a chair (including a wheelchair)?: A Little Help needed standing up from a chair using your arms (e.g., wheelchair or bedside chair)?: A Little Help needed to walk in hospital room?: A Little Help needed climbing 3-5 steps with a railing? : A Lot 6 Click Score: 17    End of Session Equipment Utilized During Treatment: Gait belt   Patient left: in chair;with call bell/phone within reach;with chair alarm set;with SCD's reapplied;with family/visitor present   PT Visit Diagnosis: Other abnormalities of gait and mobility (R26.89);Difficulty in walking, not elsewhere classified (R26.2)     Time: 1914-7829 PT Time Calculation (min) (ACUTE ONLY): 23 min  Charges:  $Therapeutic Exercise: 8-22 mins $Therapeutic Activity: 8-22 mins                     Rica Koyanagi  PTA Acute  Rehabilitation Services Pager      [phone] Office      [phone]

## 2018-10-02 NOTE — Progress Notes (Signed)
Pt is A&O, however he has been awake most of the night having conversations with people not in the room. He will refocus easily and state is knows he's at [facility] but his office is right down the hall. That he's had knee surgery but he needs to go sleep on his couch.

## 2018-10-02 NOTE — Progress Notes (Signed)
PATIENT ID: Jermaine Stewart  MRN: [id number]  DOB/AGE:  56/29/1962 / 56 y.o.  2 Days Post-Op Procedure(s) (LRB): Revision Left Knee Arthroplasty (Left)    PROGRESS NOTE Subjective: Patient is alert, oriented, no Nausea, no Vomiting, yes passing gas. Taking PO with sips and small bites. Denies SOB, Chest or Calf Pain. Using Incentive Spirometer, PAS in place. Ambulate WBAT with pt walking 55 ft , Patient reports pain as moderate .    Objective: Vital signs in last 24 hours: Vitals:   10/01/18 0936 10/01/18 1343 10/01/18 2034 10/02/18 0521  BP: (!) 135/92 113/77 123/69 (!) 153/89  Pulse: 62 62 70 97  Resp: 16 16 16 20   Temp: 98.3 F (36.8 C) 98.3 F (36.8 C) 98.2 F (36.8 C) 98.8 F (37.1 C)  TempSrc: Oral Oral Oral   SpO2: 98% 95% 95% 93%  Weight:      Height:          Intake/Output from previous day: I/O last 3 completed shifts: In: 2577.3 [P.O.:960; I.V.:1617.3] Out: 5700 [Urine:5700]   Intake/Output this shift: No intake/output data recorded.   LABORATORY DATA: Recent Labs    10/01/18 0251 10/02/18 0253  WBC 7.0 5.9  HGB 10.0* 8.9*  HCT 32.1* 28.6*  PLT 187 164  NA 131*  --   K 4.3  --   CL 98  --   CO2 25  --   BUN 9  --   CREATININE 0.86  --   GLUCOSE 154*  --   CALCIUM 8.3*  --     Examination: Neurologically intact Neurovascular intact Sensation intact distally Intact pulses distally Dorsiflexion/Plantar flexion intact Incision: scant drainage No cellulitis present Compartment soft}  Assessment:   2 Days Post-Op Procedure(s) (LRB): Revision Left Knee Arthroplasty (Left) ADDITIONAL DIAGNOSIS: Expected Acute Blood Loss Anemia, HTN and alcoholism Anticipated LOS equal to or greater than 2 midnights due to - Age 56 and older with one or more of the following:  - Obesity  - Expected need for hospital services (PT, OT, Nursing) required for safe  discharge  - Anticipated need for postoperative skilled nursing care or inpatient rehab  - Active  co-morbidities: None OR   - Unanticipated findings during/Post Surgery: Slow post-op progression: GI, pain control, mobility      Plan: PT/OT WBAT, AROM and PROM  DVT Prophylaxis:  SCDx72hrs, ASA 81 mg BID x 2 weeks DISCHARGE PLAN: Home DISCHARGE NEEDS: HHPT, Walker and 3-in-1 comode seat     Joanell Rising 10/02/2018, 7:30 AM

## 2018-10-02 NOTE — Progress Notes (Signed)
Wife stated she wanted to take him home, despite his confusion and him continually trying to get up by himself. She stated they have an adult daughter at home that can help, and another daughter that can come stay to help as well. Instructions were given to Jermaine Stewart, the patient's wife, she verbalized understanding. The patient left in stable condition

## 2018-10-02 NOTE — Progress Notes (Signed)
Physical Therapy Treatment Patient Details Name: Jermaine Stewart MRN: [id number] DOB: 05/13/1960 Today's Date: 10/02/2018    History of Present Illness 58 yo male s/p L TKR revision on 09/30/18. PMH includes L and R TKR, radiculopathy with lumbar fusion 03/2018, anemia, HTN, HLD.    PT Comments    POD # 2 pm session part 2 With spouse present.  General transfer comment: 75% VC's on safety, proper hand placement, proper walker use   Pt impaired and impulsive    Had spouse "hands on" assist pt.  General Gait Details: tolerated another long distance walk with spouse "hands on" no blood showing through bandage.General stair comments: pt required 100% VC's on proper tech, hand placement and sequencing.  Spouse present with "hands on" instruction. Advised spouse pt will need 24/7 supervision till his mental status improves.  Believe to be a combination of meds and ETOH withdrawal.  Spouse DOES feel comfortable taking pt home today and stated she will have other family members that can help.   Pt ready for D/C to home.  Follow Up Recommendations  Follow surgeon's recommendation for DC plan and follow-up therapies;Supervision for mobility/OOB;Home health PT     Equipment Recommendations  None recommended by PT    Recommendations for Other Services       Precautions / Restrictions Precautions Precautions: Fall;Knee Restrictions Weight Bearing Restrictions: No LLE Weight Bearing: Weight bearing as tolerated    Mobility  Bed Mobility Overal bed mobility: Needs Assistance Bed Mobility: Supine to Sit     Supine to sit: Supervision     General bed mobility comments: pt OOB in recliner  Transfers Overall transfer level: Needs assistance Equipment used: Rolling walker (2 wheeled) Transfers: Sit to/from Stand Sit to Stand: Min assist;Min guard         General transfer comment: 75% VC's on safety, proper hand placement, proper walker use   Pt impaired and impulsive    Had spouse  "hands on" assist pt  Ambulation/Gait Ambulation/Gait assistance: Supervision;Min guard Gait Distance (Feet): 275 Feet Assistive device: Rolling walker (2 wheeled) Gait Pattern/deviations: Step-to pattern;Decreased stance time - left;Decreased weight shift to left;Decreased step length - left;Decreased step length - right;Antalgic     General Gait Details: tolerated another long distance walk with spouse "hands on" no blood showing through bandage.   Stairs Stairs: Yes Stairs assistance: Min assist Stair Management: Two rails;Step to pattern;Forwards Number of Stairs: 2 General stair comments: pt required 100% VC's on proper tech, hand placement and sequencing.  Spouse present with "hands on" instruction.   Wheelchair Mobility    Modified Rankin (Stroke Patients Only)       Balance                                            Cognition Arousal/Alertness: Suspect due to medications Behavior During Therapy: Restless;Impulsive(impaired, confused) Overall Cognitive Status: Impaired/Different from baseline Area of Impairment: Orientation;Attention;Memory;Following commands;Safety/judgement;Awareness;Problem solving                 Orientation Level: Person Current Attention Level: Alternating Memory: Decreased recall of precautions;Decreased short-term memory Following Commands: Follows one step commands inconsistently Safety/Judgement: Decreased awareness of safety;Decreased awareness of deficits     General Comments: "under the influence" disoriented and drifts off thinking he is at work.  Can be redirected and follows commands but requires repeat VC's  Exercises      General Comments        Pertinent Vitals/Pain Pain Assessment: Faces Faces Pain Scale: No hurt Pain Location: L knee Pain Descriptors / Indicators: Dull Pain Intervention(s): Monitored during session    Home Living                      Prior Function             PT Goals (current goals can now be found in the care plan section) Progress towards PT goals: Progressing toward goals    Frequency    7X/week      PT Plan Current plan remains appropriate    Co-evaluation              AM-PAC PT "6 Clicks" Mobility   Outcome Measure  Help needed turning from your back to your side while in a flat bed without using bedrails?: A Little Help needed moving from lying on your back to sitting on the side of a flat bed without using bedrails?: A Little Help needed moving to and from a bed to a chair (including a wheelchair)?: A Little Help needed standing up from a chair using your arms (e.g., wheelchair or bedside chair)?: A Little Help needed to walk in hospital room?: A Little Help needed climbing 3-5 steps with a railing? : A Lot 6 Click Score: 17    End of Session Equipment Utilized During Treatment: Gait belt   Patient left: in chair;with call bell/phone within reach;with chair alarm set;with SCD's reapplied;with family/visitor present   PT Visit Diagnosis: Other abnormalities of gait and mobility (R26.89);Difficulty in walking, not elsewhere classified (R26.2)     Time: 1191-47821342-1409 PT Time Calculation (min) (ACUTE ONLY): 27 min  Charges:  $Gait Training: 8-22 mins $Self Care/Home Management: 8-22                       Felecia ShellingLori Ailene Royal  PTA Acute  Rehabilitation Services Pager      [phone] Office      [phone]

## 2018-10-02 NOTE — Progress Notes (Signed)
Physical Therapy Treatment Patient Details Name: Jermaine Stewart MRN: [id number] DOB: 1961-01-07 Today's Date: 10/02/2018    History of Present Illness 58 yo male s/p L TKR revision on 09/30/18. PMH includes L and R TKR, radiculopathy with lumbar fusion 03/2018, anemia, HTN, HLD.    PT Comments    POD # 2 pm session General Gait Details: PA instructed to "milk out" what he called blood from a Hematoma so added extra dressing and amb a great distance.  Pt required 75% VC's for direction due to impaired cognition (meds?)  Tolerated distance well.  No c/o pain.  No true LOB.  Slight unsteady.  Instructed spouse to "hands on" assist. Returned to room.  Smaller amount of blood on dressing this time.  Assisted RN with another dressing change and applied ICE.  Pt will need one more PT session to address stairs and hope pt's cognition improves.   Follow Up Recommendations  Follow surgeon's recommendation for DC plan and follow-up therapies;Supervision for mobility/OOB;Home health PT     Equipment Recommendations  None recommended by PT    Recommendations for Other Services       Precautions / Restrictions Precautions Precautions: Fall;Knee Restrictions Weight Bearing Restrictions: No LLE Weight Bearing: Weight bearing as tolerated    Mobility  Bed Mobility Overal bed mobility: Needs Assistance Bed Mobility: Supine to Sit     Supine to sit: Supervision     General bed mobility comments: pt OOB in recliner  Transfers Overall transfer level: Needs assistance Equipment used: Rolling walker (2 wheeled) Transfers: Sit to/from Stand Sit to Stand: Min assist;Min guard         General transfer comment: 75% VC's on safety, proper hand placement, proper walker use   Pt impaired and impulsive  Ambulation/Gait Ambulation/Gait assistance: Supervision;Min guard Gait Distance (Feet): 220 Feet Assistive device: Rolling walker (2 wheeled) Gait Pattern/deviations: Step-to  pattern;Decreased stance time - left;Decreased weight shift to left;Decreased step length - left;Decreased step length - right;Antalgic     General Gait Details: PA instructed to "milk out" what he called blood from a Hematoma so added extra dressing and amb a great distance.  Pt required 75% VC's for direction due to impaired cognition (meds?)  Tolerated distance well.  No c/o pain.  No true LOB.  Slight unsteady.  Instructed spouse to "hands on" assist.   Stairs             Wheelchair Mobility    Modified Rankin (Stroke Patients Only)       Balance                                            Cognition Arousal/Alertness: Suspect due to medications Behavior During Therapy: Restless;Impulsive(impaired, confused) Overall Cognitive Status: Impaired/Different from baseline Area of Impairment: Orientation;Attention;Memory;Following commands;Safety/judgement;Awareness;Problem solving                 Orientation Level: Person Current Attention Level: Alternating Memory: Decreased recall of precautions;Decreased short-term memory Following Commands: Follows one step commands inconsistently Safety/Judgement: Decreased awareness of safety;Decreased awareness of deficits     General Comments: "under the influence" disoriented and drifts off thinking he is at work.  Can be redirected and follows commands but requires repeat VC's      Exercises      General Comments        Pertinent Vitals/Pain Pain Assessment: Faces  Faces Pain Scale: No hurt Pain Location: L knee Pain Descriptors / Indicators: Dull Pain Intervention(s): Monitored during session    Home Living                      Prior Function            PT Goals (current goals can now be found in the care plan section) Progress towards PT goals: Progressing toward goals    Frequency    7X/week      PT Plan Current plan remains appropriate    Co-evaluation               AM-PAC PT "6 Clicks" Mobility   Outcome Measure  Help needed turning from your back to your side while in a flat bed without using bedrails?: A Little Help needed moving from lying on your back to sitting on the side of a flat bed without using bedrails?: A Little Help needed moving to and from a bed to a chair (including a wheelchair)?: A Little Help needed standing up from a chair using your arms (e.g., wheelchair or bedside chair)?: A Little Help needed to walk in hospital room?: A Little Help needed climbing 3-5 steps with a railing? : A Lot 6 Click Score: 17    End of Session Equipment Utilized During Treatment: Gait belt   Patient left: in chair;with call bell/phone within reach;with chair alarm set;with SCD's reapplied;with family/visitor present   PT Visit Diagnosis: Other abnormalities of gait and mobility (R26.89);Difficulty in walking, not elsewhere classified (R26.2)     Time: [id number] PT Time Calculation (min) (ACUTE ONLY): 25 min  Charges:  $Gait Training: 8-22 mins $Therapeutic Activity: 8-22 mins                     Felecia ShellingLori Prestina Raigoza  PTA Acute  Rehabilitation Services Pager      [phone] Office      [phone]

## 2018-10-05 LAB — AEROBIC/ANAEROBIC CULTURE W GRAM STAIN (SURGICAL/DEEP WOUND): Culture: NO GROWTH

## 2018-10-09 ENCOUNTER — Encounter (HOSPITAL_COMMUNITY): Payer: Self-pay | Admitting: Orthopedic Surgery

## 2018-10-31 NOTE — Progress Notes (Signed)
FOLLOW UP  Assessment and Plan:   Hypertension Well controlled with current medications  Monitor blood pressure and pulse at home; patient to call if consistently greater than 130/80 Continue DASH diet.   Reminder to go to the ER if any CP, SOB, nausea, dizziness, severe HA, changes vision/speech, left arm numbness and tingling and jaw pain.  Cholesterol Currently above goal; on zetia 10 mg, rosuvastatin 20 mg twice weekly (hx of myalgias on high dose statin), fenofibrate Trigs recently well controlled; he would like to get off fenofibrate if possible; if well controlled today, suggested trial on fish oil and hold fenofibrate which he is agreeable to  Lifestyle for trigs reviewed, will have him focus on this, information provided on AVS Continue low cholesterol diet and exercise.  Check lipid panel.   Other abnormal glucose Recent A1Cs  well controlled Continue diet and exercise.  Perform daily foot/skin check, notify office of any concerning changes.  Defer A1C; check annually; check CMP today  Vitamin D Def Below goal at recent check; he has not increased dose, wasn't taking consistently at last visit but has restarted continue to recommend supplementation for goal of 60-100 Defer vitamin D level  GERD Symptoms well managed without breakthrough Continue PPI for now due to possible gastric ulcer, persistent iron def anemia  Anemia Acute blood loss on chronic iron deficiency anemia (attributed to NSAID ulcers) S/p transfusion CBC,  He is taking iron supplement daily x 1 year, unsure of dose After discussion will refer back to GI for overdue screening colonoscopy and for possible repeat EGD  Continue diet and meds as discussed. Further disposition pending results of labs. Discussed med's effects and SE's.   Over 30 minutes of exam, counseling, chart review, and critical decision making was performed.   Future Appointments  Date Time Provider [place]  02/04/2019   4:30 PM Unk Pinto, MD [facility] None  08/19/2019  3:00 PM Unk Pinto, MD [facility] None    ----------------------------------------------------------------------------------------------------------------------  HPI 58 y.o. male  presents for 3 month follow up on hypertension, cholesterol, glucose management, weight and vitamin D deficiency.   He recently underwent L TKA revision due to loose implant on 09/30/2018, by Dr. Mayer Camel, reports has done well since.   He had post-op blood loss anemia ( hbg 8.9%) superimposed on baseline iron deficiency anemia (hgb 12.5 on 6/10) and underwent transfusion. Baseline anemia attributed to bleeding NSAID ulcers last year, never followed up for repeat EGD; patient is notably also overdue for screening colonoscopy; he reports did cologuard x 2 last year never heard back results, no records in system. He reports is taking ? 325 mg daily OTC iron supplement for 1 year, iron remained low at recent check at [facility] in June 2020.  Lab Results  Component Value Date   WBC 5.9 10/02/2018   HGB 8.9 (L) 10/02/2018   HCT 28.6 (L) 10/02/2018   MCV 90.2 10/02/2018   PLT 164 10/02/2018   Lab Results  Component Value Date   IRON 48 (L) 07/31/2018   TIBC 415 07/31/2018   FERRITIN 64 02/18/2018   Lab Results  Component Value Date   VITAMINB12 314 07/31/2018    He is following with Dr. Lynann Bologna for L4-5 left radicular pain, followed by Dr. Lynann Bologna and in Feb 2020 underwent L sided L4-5 fusion and has done well since.   he has a diagnosis of GERD which is currently managed by omeprazole 40 mg daily since 10/2017 for ulcer attributed to NSAID/steroid use, never  did follow up for repeat EGD he reports symptoms is currently well controlled, and denies breakthrough reflux, burning in chest, hoarseness or cough.    BMI is Body mass index is 24.37 kg/m., he has not intentionally been working on weight due to surgery, eating less generally during the summer,  walking a lot (8 miles daily) and doing push ups and sit ups daily.  Wt Readings from Last 3 Encounters:  11/04/18 165 lb (74.8 kg)  09/30/18 166 lb 8 oz (75.5 kg)  09/25/18 166 lb 8 oz (75.5 kg)   His blood pressure has been controlled at home, today their BP is BP: 124/72   He does workout. He denies chest pain, shortness of breath, dizziness, PND, fatigue, palpitations.    He is on cholesterol medication (zetia 10 mg daily, rosuvastatin 20 mg twice a week- myalgias with increased dose, fenofibrate 145 mg daily) and denies myalgias. His LDL cholesterol is not at goal; LDL waw slightly elevated. The cholesterol last visit was:   Lab Results  Component Value Date   CHOL 194 07/31/2018   HDL 68 07/31/2018   LDLCALC 102 (H) 07/31/2018   TRIG 137 07/31/2018   CHOLHDL 2.9 07/31/2018    He has been working on diet and exercise for glucose management, and denies increased appetite, nausea, paresthesia of the feet, polydipsia, polyuria and visual disturbances. Last A1C in the office was:  Lab Results  Component Value Date   HGBA1C 5.1 07/31/2018   Patient is on vitamin D supplement and at goal at the last check:    Lab Results  Component Value Date   VD25OH 78 07/31/2018       Current Medications:  Current Outpatient Medications on File Prior to Visit  Medication Sig  . Ascorbic Acid (VITAMIN C) 1000 MG tablet Take 1,000 mg by mouth daily.  Marland Kitchen. atenolol (TENORMIN) 100 MG tablet Take 1 tablet at Bedtime for BP (Patient taking differently: Take 100 mg by mouth at bedtime. Take 1 tablet at Bedtime for BP)  . buPROPion (WELLBUTRIN XL) 150 MG 24 hr tablet Take 1 tablet every morning for Mood (Patient taking differently: Take 150 mg by mouth daily. Take 1 tablet every morning for Mood)  . Cholecalciferol (VITAMIN D-3) 5000 UNITS TABS Take 15,000 Units by mouth daily.   Marland Kitchen. ezetimibe (ZETIA) 10 MG tablet TAKE 1 TABLET DAILY (Patient taking differently: Take 10 mg by mouth daily. )  . fenofibrate  (TRICOR) 145 MG tablet TAKE 1 TABLET DAILY FOR BLOOD FATS (Patient taking differently: Take 145 mg by mouth daily. Take 1 tablet daily for Blood Fats)  . fluticasone (FLONASE) 50 MCG/ACT nasal spray Use 1 to Sprays each nares 1 to 2 x /day (Patient taking differently: Place 1 spray into both nostrils daily as needed for allergies. )  . omeprazole (PRILOSEC) 40 MG capsule Take 1 capsule 2 x /day for Acid Indigestion & Reflux (Patient taking differently: Take 40 mg by mouth daily. )  . rosuvastatin (CRESTOR) 20 MG tablet Take twice a week in the evening for cholesterol. (Patient taking differently: Take 20 mg by mouth 2 (two) times a week. Take twice a week in the evening for cholesterol. Tues and Thurs)  . tiZANidine (ZANAFLEX) 2 MG tablet Take 1 tablet (2 mg total) by mouth every 6 (six) hours as needed.  . zinc gluconate 50 MG tablet Take 50 mg by mouth daily.  Marland Kitchen. aspirin EC 81 MG tablet Take 1 tablet (81 mg total) by mouth 2 (  two) times daily. (Patient not taking: Reported on 11/04/2018)   No current facility-administered medications on file prior to visit.      Allergies:  Allergies  Allergen Reactions  . Atorvastatin Other (See Comments)    Arthralgias  . Percocet [Oxycodone-Acetaminophen] Other (See Comments)    Hallucinates     Medical History:  Past Medical History:  Diagnosis Date  . Arthritis   . Depression   . GERD (gastroesophageal reflux disease)   . Hyperlipidemia   . Hypertension    Family history- Reviewed and unchanged Social history- Reviewed and unchanged   Review of Systems:  Review of Systems  Constitutional: Negative for malaise/fatigue and weight loss.  HENT: Negative for hearing loss and tinnitus.   Eyes: Negative for blurred vision and double vision.  Respiratory: Negative for cough, shortness of breath and wheezing.   Cardiovascular: Negative for chest pain, palpitations, orthopnea, claudication, leg swelling and PND.  Gastrointestinal: Negative for  abdominal pain, blood in stool, constipation, diarrhea, heartburn, melena, nausea and vomiting.  Genitourinary: Negative.   Musculoskeletal: Negative for back pain, joint pain and myalgias.  Skin: Negative for rash.  Neurological: Negative for dizziness, tingling, sensory change, weakness and headaches.  Endo/Heme/Allergies: Negative for polydipsia.  Psychiatric/Behavioral: Negative.   All other systems reviewed and are negative.    Physical Exam: BP 124/72   Pulse 81   Temp 98.2 F (36.8 C)   Ht 5\' 9"  (1.753 m)   Wt 165 lb (74.8 kg)   SpO2 98%   BMI 24.37 kg/m  Wt Readings from Last 3 Encounters:  11/04/18 165 lb (74.8 kg)  09/30/18 166 lb 8 oz (75.5 kg)  09/25/18 166 lb 8 oz (75.5 kg)   General Appearance: Well nourished, in no apparent distress. Eyes: PERRLA, EOMs, conjunctiva no swelling or erythema Sinuses: No Frontal/maxillary tenderness ENT/Mouth: Ext aud canals clear, TMs without erythema, bulging. No erythema, swelling, or exudate on post pharynx.  Tonsils not swollen or erythematous. Hearing normal.  Neck: Supple, thyroid normal.  Respiratory: Respiratory effort normal, BS equal bilaterally without rales, rhonchi, wheezing or stridor.  Cardio: RRR with no MRGs. Brisk peripheral pulses with 1+ pitting edema of lower legs bilaterally, pedal area.  Abdomen: Soft, + BS.  Non tender, no guarding, rebound, hernias, masses. Lymphatics: Non tender without lymphadenopathy.  Musculoskeletal: Full peripheral ROM, 5/5 strength, Normal gait. Well healing surgical scar over left knee, though with mild erythema to lower aspect; no notable edema; mildly tender over incision; not notably warm Skin: Warm, dry without rashes, lesions, ecchymosis.  Neuro: Cranial nerves intact. No cerebellar symptoms.  Psych: Awake and oriented X 3, normal affect, Insight and Judgment appropriate.    Dan Maker, NP 4:52 PM [facility]

## 2018-11-04 ENCOUNTER — Other Ambulatory Visit: Payer: Self-pay

## 2018-11-04 ENCOUNTER — Encounter: Payer: Self-pay | Admitting: Adult Health

## 2018-11-04 ENCOUNTER — Ambulatory Visit (INDEPENDENT_AMBULATORY_CARE_PROVIDER_SITE_OTHER): Payer: Commercial Managed Care - PPO | Admitting: Adult Health

## 2018-11-04 VITALS — BP 124/72 | HR 81 | Temp 98.2°F | Ht 69.0 in | Wt 165.0 lb

## 2018-11-04 DIAGNOSIS — I10 Essential (primary) hypertension: Secondary | ICD-10-CM

## 2018-11-04 DIAGNOSIS — K219 Gastro-esophageal reflux disease without esophagitis: Secondary | ICD-10-CM | POA: Diagnosis not present

## 2018-11-04 DIAGNOSIS — Z1211 Encounter for screening for malignant neoplasm of colon: Secondary | ICD-10-CM

## 2018-11-04 DIAGNOSIS — E782 Mixed hyperlipidemia: Secondary | ICD-10-CM

## 2018-11-04 DIAGNOSIS — R7309 Other abnormal glucose: Secondary | ICD-10-CM

## 2018-11-04 DIAGNOSIS — Z79899 Other long term (current) drug therapy: Secondary | ICD-10-CM

## 2018-11-04 DIAGNOSIS — E559 Vitamin D deficiency, unspecified: Secondary | ICD-10-CM | POA: Diagnosis not present

## 2018-11-04 DIAGNOSIS — Z6825 Body mass index (BMI) 25.0-25.9, adult: Secondary | ICD-10-CM

## 2018-11-04 DIAGNOSIS — D649 Anemia, unspecified: Secondary | ICD-10-CM

## 2018-11-04 MED ORDER — BUPROPION HCL ER (XL) 150 MG PO TB24: ORAL_TABLET | ORAL | 0 refills | Status: DC

## 2018-11-04 MED ORDER — EZETIMIBE 10 MG PO TABS: 10.0000 mg | ORAL_TABLET | Freq: Every day | ORAL | 0 refills | Status: DC

## 2018-11-04 MED ORDER — ROSUVASTATIN CALCIUM 20 MG PO TABS: ORAL_TABLET | ORAL | 1 refills | Status: DC

## 2018-11-04 MED ORDER — TIZANIDINE HCL 2 MG PO TABS: 2.0000 mg | ORAL_TABLET | Freq: Four times a day (QID) | ORAL | 0 refills | Status: DC | PRN

## 2018-11-04 NOTE — Patient Instructions (Addendum)
Goals    . Blood Pressure < 130/80    . LDL CALC < 100        STOP aspirin until anemia fully resolved - restart only if OK'd by GI  Please message me with your iron supplement dose   If triglycerides are well controlled on today's check:   Add fish oil daily (check bottle and take max dose)  Hold fenofibrate until the next visit  Read triglycerides lifestyle information below and try to make changes accordingly -    High Triglycerides Eating Plan Triglycerides are a type of fat in the blood. High levels of triglycerides can increase your risk of heart disease and stroke. If your triglyceride levels are high, choosing the right foods can help lower your triglycerides and keep your heart healthy. Work with your health care provider or a diet and nutrition specialist (dietitian) to develop an eating plan that is right for you. What are tips for following this plan? General guidelines   Lose weight, if you are overweight. For most people, losing 5-10 lbs (2-5 kg) helps lower triglyceride levels. A weight-loss plan may include. ? 30 minutes of exercise at least 5 days a week. ? Reducing the amount of calories, sugar, and fat you eat.  Eat a wide variety of fresh fruits, vegetables, and whole grains. These foods are high in fiber.  Eat foods that contain healthy fats, such as fatty fish, nuts, seeds, and olive oil.  Avoid foods that are high in added sugar, added salt (sodium), saturated fat, and trans fat.  Avoid low-fiber, refined carbohydrates such as white bread, crackers, noodles, and white rice.  Avoid foods with partially hydrogenated oils (trans fats), such as fried foods or stick margarine.  Limit alcohol intake to no more than 1 drink a day for nonpregnant women and 2 drinks a day for men. One drink equals 12 oz of beer, 5 oz of wine, or 1 oz of hard liquor. Your health care provider may recommend that you drink less depending on your overall health. Reading food labels   Check food labels for the amount of saturated fat. Choose foods with no or very little saturated fat.  Check food labels for the amount of trans fat. Choose foods with no trans fat.  Check food labels for the amount of cholesterol. Choose foods low in cholesterol. Ask your dietitian how much cholesterol you should have each day.  Check food labels for the amount of sodium. Choose foods with less than 140 milligrams (mg) per serving. Shopping  Buy dairy products labeled as nonfat (skim) or low-fat (1%).  Avoid buying processed or prepackaged foods. These are often high in added sugar, sodium, and fat. Cooking  Choose healthy fats when cooking, such as olive oil or canola oil.  Cook foods using lower fat methods, such as baking, broiling, boiling, or grilling.  Make your own sauces, dressings, and marinades when possible, instead of buying them. Store-bought sauces, dressings, and marinades are often high in sodium and sugar. Meal planning  Eat more home-cooked food and less restaurant, buffet, and fast food.  Eat fatty fish at least 2 times each week. Examples of fatty fish include salmon, trout, mackerel, tuna, and herring.  If you eat whole eggs, do not eat more than 3 egg yolks per week. What foods are recommended? The items listed may not be a complete list. Talk with your dietitian about what dietary choices are best for you. Grains Whole wheat or whole grain breads,  crackers, cereals, and pasta. Unsweetened oatmeal. Bulgur. Barley. Quinoa. Brown rice. Whole wheat flour tortillas. Vegetables Fresh or frozen vegetables. Low-sodium canned vegetables. Fruits All fresh, canned (in natural juice), or frozen fruits. Meats and other protein foods Skinless chicken or [place]. Ground chicken or [place]. Lean cuts of pork, trimmed of fat. Fish and seafood, especially salmon, trout, and herring. Egg whites. Dried beans, peas, or lentils. Unsalted nuts or seeds. Unsalted canned beans.  Natural peanut or almond butter. Dairy Low-fat dairy products. Skim or low-fat (1%) milk. Reduced fat (2%) and low-sodium cheese. Low-fat ricotta cheese. Low-fat cottage cheese. Plain, low-fat yogurt. Fats and oils Tub margarine without trans fats. Light or reduced-fat mayonnaise. Light or reduced-fat salad dressings. Avocado. Safflower, olive, sunflower, soybean, and canola oils. What foods are not recommended? The items listed may not be a complete list. Talk with your dietitian about what dietary choices are best for you. Grains White bread. White (regular) pasta. White rice. Cornbread. Bagels. Pastries. Crackers that contain trans fat. Vegetables Creamed or fried vegetables. Vegetables in a cheese sauce. Fruits Sweetened dried fruit. Canned fruit in syrup. Fruit juice. Meats and other protein foods Fatty cuts of meat. Ribs. Chicken wings. Tomasa BlaseBacon. Sausage. Bologna. Salami. Chitterlings. Fatback. Hot dogs. Bratwurst. Packaged lunch meats. Dairy Whole or reduced-fat (2%) milk. Half-and-half. Cream cheese. Full-fat or sweetened yogurt. Full-fat cheese. Nondairy creamers. Whipped toppings. Processed cheese or cheese spreads. Cheese curds. Beverages Alcohol. Sweetened drinks, such as soda, lemonade, fruit drinks, or punches. Fats and oils Butter. Stick margarine. Lard. Shortening. Ghee. Bacon fat. Tropical oils, such as coconut, palm kernel, or palm oils. Sweets and desserts Corn syrup. Sugars. Honey. Molasses. Candy. Jam and jelly. Syrup. Sweetened cereals. Cookies. Pies. Cakes. Donuts. Muffins. Ice cream. Condiments Store-bought sauces, dressings, and marinades that are high in sugar, such as ketchup and barbecue sauce. Summary  High levels of triglycerides can increase the risk of heart disease and stroke. Choosing the right foods can help lower your triglycerides.  Eat plenty of fresh fruits, vegetables, and whole grains. Choose low-fat dairy and lean meats. Eat fatty fish at least  twice a week.  Avoid processed and prepackaged foods with added sugar, sodium, saturated fat, and trans fat.  If you need suggestions or have questions about what types of food are good for you, talk with your health care provider or a dietitian. This information is not intended to replace advice given to you by your health care provider. Make sure you discuss any questions you have with your health care provider. Document Released: 11/25/2003 Document Revised: 01/19/2017 Document Reviewed: 04/11/2016 Elsevier Patient Education  2020 [employer]

## 2018-11-05 ENCOUNTER — Encounter: Payer: Self-pay | Admitting: Adult Health

## 2018-11-05 LAB — COMPLETE METABOLIC PANEL WITH GFR
AG Ratio: 2 (calc) (ref 1.0–2.5)
ALT: 18 U/L (ref 9–46)
AST: 18 U/L (ref 10–35)
Albumin: 4.7 g/dL (ref 3.6–5.1)
Alkaline phosphatase (APISO): 63 U/L (ref 35–144)
BUN: 10 mg/dL (ref 7–25)
CO2: 30 mmol/L (ref 20–32)
Calcium: 9.8 mg/dL (ref 8.6–10.3)
Chloride: 104 mmol/L (ref 98–110)
Creat: 0.92 mg/dL (ref 0.70–1.33)
GFR, Est African American: 108 mL/min/{1.73_m2} (ref >=60)
GFR, Est Non African American: 93 mL/min/{1.73_m2} (ref >=60)
Globulin: 2.4 g/dL (calc) (ref 1.9–3.7)
Glucose, Bld: 100 mg/dL — ABNORMAL HIGH (ref 65–99)
Potassium: 4.1 mmol/L (ref 3.5–5.3)
Sodium: 140 mmol/L (ref 135–146)
Total Bilirubin: 0.4 mg/dL (ref 0.2–1.2)
Total Protein: 7.1 g/dL (ref 6.1–8.1)

## 2018-11-05 LAB — CBC WITH DIFFERENTIAL/PLATELET
Absolute Monocytes: 640 cells/uL (ref 200–950)
Basophils Absolute: 33 cells/uL (ref 0–200)
Basophils Relative: 0.5 %
Eosinophils Absolute: 99 cells/uL (ref 15–500)
Eosinophils Relative: 1.5 %
HCT: 33.2 % — ABNORMAL LOW (ref 38.5–50.0)
Hemoglobin: 10.1 g/dL — ABNORMAL LOW (ref 13.2–17.1)
Lymphs Abs: 1366 cells/uL (ref 850–3900)
MCH: 25.6 pg — ABNORMAL LOW (ref 27.0–33.0)
MCHC: 30.4 g/dL — ABNORMAL LOW (ref 32.0–36.0)
MCV: 84.3 fL (ref 80.0–100.0)
MPV: 10.7 fL (ref 7.5–12.5)
Monocytes Relative: 9.7 %
Neutro Abs: 4462 cells/uL (ref 1500–7800)
Neutrophils Relative %: 67.6 %
Platelets: 232 10*3/uL (ref 140–400)
RBC: 3.94 10*6/uL — ABNORMAL LOW (ref 4.20–5.80)
RDW: 14.6 % (ref 11.0–15.0)
Total Lymphocyte: 20.7 %
WBC: 6.6 10*3/uL (ref 3.8–10.8)

## 2018-11-05 LAB — LIPID PANEL
Cholesterol: 192 mg/dL (ref <200)
HDL: 81 mg/dL (ref >=40)
LDL Cholesterol (Calc): 91 mg/dL (calc)
Non-HDL Cholesterol (Calc): 111 mg/dL (calc) (ref <130)
Total CHOL/HDL Ratio: 2.4 (calc) (ref <5.0)
Triglycerides: 105 mg/dL (ref <150)

## 2018-11-05 LAB — TSH: TSH: 1.52 mIU/L (ref 0.40–4.50)

## 2018-11-05 LAB — MAGNESIUM: Magnesium: 1.9 mg/dL (ref 1.5–2.5)

## 2018-11-10 ENCOUNTER — Other Ambulatory Visit: Payer: Self-pay | Admitting: Internal Medicine

## 2018-11-10 MED ORDER — BUPROPION HCL ER (XL) 150 MG PO TB24: ORAL_TABLET | ORAL | 3 refills | Status: DC

## 2018-11-17 ENCOUNTER — Other Ambulatory Visit: Payer: Self-pay | Admitting: Adult Health

## 2018-12-06 ENCOUNTER — Encounter: Payer: Self-pay | Admitting: Adult Health

## 2018-12-08 ENCOUNTER — Other Ambulatory Visit: Payer: Self-pay | Admitting: Internal Medicine

## 2018-12-08 DIAGNOSIS — G8929 Other chronic pain: Secondary | ICD-10-CM

## 2018-12-08 DIAGNOSIS — M5442 Lumbago with sciatica, left side: Secondary | ICD-10-CM

## 2018-12-08 MED ORDER — TIZANIDINE HCL 2 MG PO TABS: ORAL_TABLET | ORAL | 0 refills | Status: DC

## 2018-12-27 ENCOUNTER — Other Ambulatory Visit: Payer: Self-pay | Admitting: Internal Medicine

## 2018-12-27 DIAGNOSIS — G8929 Other chronic pain: Secondary | ICD-10-CM

## 2018-12-27 MED ORDER — TIZANIDINE HCL 2 MG PO TABS: ORAL_TABLET | ORAL | 0 refills | Status: DC

## 2019-01-13 ENCOUNTER — Other Ambulatory Visit: Payer: Self-pay | Admitting: Internal Medicine

## 2019-01-13 DIAGNOSIS — G8929 Other chronic pain: Secondary | ICD-10-CM

## 2019-01-13 MED ORDER — TIZANIDINE HCL 2 MG PO TABS: ORAL_TABLET | ORAL | 0 refills | Status: DC

## 2019-01-30 ENCOUNTER — Other Ambulatory Visit: Payer: Self-pay | Admitting: Internal Medicine

## 2019-01-30 DIAGNOSIS — G8929 Other chronic pain: Secondary | ICD-10-CM

## 2019-01-30 MED ORDER — TIZANIDINE HCL 2 MG PO TABS: ORAL_TABLET | ORAL | 0 refills | Status: DC

## 2019-02-03 ENCOUNTER — Other Ambulatory Visit: Payer: Self-pay | Admitting: Adult Health

## 2019-02-03 ENCOUNTER — Encounter: Payer: Self-pay | Admitting: Internal Medicine

## 2019-02-03 NOTE — Patient Instructions (Signed)

## 2019-02-03 NOTE — Progress Notes (Signed)
History of Present Illness:      This very nice 58 y.o. MWM presents for 6 month follow up with HTN, HLD, Pre-Diabetes and Vitamin D Deficiency.       Patient is treated for HTN (2000) & BP has been controlled at home. Today's BP: is at goal -130/80. Patient has had no complaints of any cardiac type chest pain, palpitations, dyspnea / orthopnea / PND, dizziness, claudication, or dependent edema.      Hyperlipidemia is controlled with diet & Zetia [name] & Low dose Crestor. Patient denies myalgias or other med SE's. Last Lipids were at goal:  Lab Results  Component Value Date   CHOL 192 11/04/2018   HDL 81 11/04/2018   LDLCALC 91 11/04/2018   TRIG 105 11/04/2018   CHOLHDL 2.4 11/04/2018        Also, the patient has history of abnormal glucose and is monitored for glucose intolerance.  Patient denies symptoms of reactive hypoglycemia, diabetic polys, paresthesias or visual blurring.  Last A1c was Normal & at goal:  Lab Results  Component Value Date   HGBA1C 5.1 07/31/2018        Further, the patient also has history of Vitamin D Deficiency ("25" / 2014)    and supplements vitamin D without any suspected side-effects. Last vitamin D was nat goal:  Lab Results  Component Value Date   VD25OH 78 07/31/2018    Current Outpatient Medications on File Prior to Visit  Medication Sig  . Ascorbic Acid (VITAMIN C) 1000 MG tablet Take 1,000 mg by mouth daily.  Marland Kitchen atenolol (TENORMIN) 100 MG tablet Take 1 tablet at Bedtime for BP (Patient taking differently: Take 100 mg by mouth at bedtime. Take 1 tablet at Bedtime for BP)  . buPROPion (WELLBUTRIN XL) 150 MG 24 hr tablet TAKE 1 TABLET BY MOUTH EVERY MORNING FOR MOOD  . Cholecalciferol (VITAMIN D-3) 5000 UNITS TABS Take 15,000 Units by mouth daily.   Marland Kitchen ezetimibe (ZETIA) 10 MG tablet TAKE 1 TABLET(10 MG) BY MOUTH DAILY  . fenofibrate (TRICOR) 145 MG tablet TAKE 1 TABLET DAILY FOR BLOOD FATS (Patient taking differently: Take 145 mg by  mouth daily. Take 1 tablet daily for Blood Fats)  . fluticasone (FLONASE) 50 MCG/ACT nasal spray Use 1 to Sprays each nares 1 to 2 x /day (Patient taking differently: Place 1 spray into both nostrils daily as needed for allergies. )  . omeprazole (PRILOSEC) 40 MG capsule Take 1 capsule 2 x /day for Acid Indigestion & Reflux (Patient taking differently: Take 40 mg by mouth daily. )  . rosuvastatin (CRESTOR) 20 MG tablet Take twice a week in the evening for cholesterol.  Marland Kitchen tiZANidine (ZANAFLEX) 2 MG tablet Take 1/2 to 1 tablet 3 x /day ONLY  if needed for Muscle Spasm  . zinc gluconate 50 MG tablet Take 50 mg by mouth daily.   No current facility-administered medications on file prior to visit.    Allergies  Allergen Reactions  . Atorvastatin Other (See Comments)    Arthralgias  . Percocet [Oxycodone-Acetaminophen] Other (See Comments)    Hallucinates    PMHx:   Past Medical History:  Diagnosis Date  . Arthritis   . Depression   . GERD (gastroesophageal reflux disease)   . Hyperlipidemia   . Hypertension    Immunization History  Administered Date(s) Administered  . Influenza Inj Mdck Quad With Preservative 11/29/2017  . Influenza,inj,Quad PF,6+ Mos 01/31/2019  . Influenza,inj,quad, With Preservative 12/21/2015  .  Influenza-Unspecified 11/01/2016  . PPD Test 07/22/2013, 04/24/2016, 06/26/2017, 07/31/2018  . Tdap 07/22/2013   Past Surgical History:  Procedure Laterality Date  . HERNIA REPAIR Bilateral    15 years ago  . 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 TOTAL KNEE ARTHROPLASTY Right 03/04/2014   Procedure: TOTAL KNEE ARTHROPLASTY;  Surgeon: Kerin Salen, MD;  Location: [place];  Service: Orthopedics;  Laterality: Right;  . TOTAL KNEE ARTHROPLASTY Left 09/30/2018   Procedure: Revision Left Knee Arthroplasty;  Surgeon: Frederik Pear, MD;  Location:  [facility];  Service: Orthopedics;  Laterality: Left;  . TRANSFORAMINAL LUMBAR INTERBODY FUSION (TLIF) WITH PEDICLE SCREW FIXATION 1 LEVEL Left 03/28/2018   Procedure: LEFT SIDED LUMBAR FOUR-FIVE TRANSFORAMINAL LUMBAR INTERBODY FUSION WITH INSTRUMENTATION AND ALLOGRAFT;  Surgeon: Phylliss Bob, MD;  Location: [facility] [facility];  Service: Orthopedics;  Laterality: Left;  Marland Kitchen VASECTOMY      FHx:    Reviewed / unchanged  SHx:    Reviewed / unchanged   Systems Review:  Constitutional: Denies fever, chills, wt changes, headaches, insomnia, fatigue, night sweats, change in appetite. Eyes: Denies redness, blurred vision, diplopia, discharge, itchy, watery eyes.  ENT: Denies discharge, congestion, post nasal drip, epistaxis, sore throat, earache, hearing loss, dental pain, tinnitus, vertigo, sinus pain, snoring.  CV: Denies chest pain, palpitations, irregular heartbeat, syncope, dyspnea, diaphoresis, orthopnea, PND, claudication or edema. Respiratory: denies cough, dyspnea, DOE, pleurisy, hoarseness, laryngitis, wheezing.  Gastrointestinal: Denies dysphagia, odynophagia, heartburn, reflux, water brash, abdominal pain or cramps, nausea, vomiting, bloating, diarrhea, constipation, hematemesis, melena, hematochezia  or hemorrhoids. Genitourinary: Denies dysuria, frequency, urgency, nocturia, hesitancy, discharge, hematuria or flank pain. Musculoskeletal: Denies arthralgias, myalgias, stiffness, jt. swelling, pain, limping or strain/sprain.  Skin: Denies pruritus, rash, hives, warts, acne, eczema or change in skin lesion(s). Neuro: No weakness, tremor, incoordination, spasms, paresthesia or pain. Psychiatric: Denies confusion, memory loss or sensory loss. Endo: Denies change in weight, skin or hair change.  Heme/Lymph: No excessive bleeding, bruising or enlarged lymph nodes.  Physical Exam  BP 130/80   Pulse 64   Temp (!) 97.2 F (36.2 C)   Resp 16   Ht 5\' 9"  (1.753 m)   Wt 172 lb 3.2 oz (78.1 kg)   BMI 25.43  kg/m   Appears  well nourished, well groomed  and in no distress.  Eyes: PERRLA, EOMs, conjunctiva no swelling or erythema. Sinuses: No frontal/maxillary tenderness ENT/Mouth: EAC's clear, TM's nl w/o erythema, bulging. Nares clear w/o erythema, swelling, exudates. Oropharynx clear without erythema or exudates. Oral hygiene is good. Tongue normal, non obstructing. Hearing intact.  Neck: Supple. Thyroid not palpable. Car 2+/2+ without bruits, nodes or JVD. Chest: Respirations nl with BS clear & equal w/o rales, rhonchi, wheezing or stridor.  Cor: Heart sounds normal w/ regular rate and rhythm without sig. murmurs, gallops, clicks or rubs. Peripheral pulses normal and equal  without edema.  Abdomen: Soft & bowel sounds normal. Non-tender w/o guarding, rebound, hernias, masses or organomegaly.  Lymphatics: Unremarkable.  Musculoskeletal: Full ROM all peripheral extremities, joint stability, 5/5 strength and normal gait.  Skin: Warm, dry without exposed rashes, lesions or ecchymosis apparent.  Neuro: Cranial nerves intact, reflexes equal bilaterally. Sensory-motor testing grossly intact. Tendon reflexes grossly intact.  Pysch: Alert & oriented x 3.  Insight and judgement nl & appropriate. No ideations.  Assessment and Plan:  1. Essential hypertension  - Continue  medication, monitor blood pressure at home.  - Continue DASH diet.  Reminder to go to the ER if any CP,  SOB, nausea, dizziness, severe HA, changes vision/speech.  - CBC with Diff - COMPLETE METABOLIC PANEL WITH GFR - Magnesium - TSH  2. Hyperlipidemia, mixed  - Continue diet/meds, exercise,& lifestyle modifications.  - Continue monitor periodic cholesterol/liver & renal functions   - Lipid Profile - TSH  3. Abnormal glucose  - Continue diet, exercise  - Lifestyle modifications.  - Monitor appropriate labs.  - Hemoglobin A1c ([facility]) - Insulin, random  4. Vitamin D deficiency  - Continue supplementation.  -  Vitamin D (25 hydroxy)  5. Medication management  - CBC with Diff - COMPLETE METABOLIC PANEL WITH GFR - Magnesium - Lipid Profile - TSH - Hemoglobin A1c ([facility]) - Insulin, random - Vitamin D (25 hydroxy)         Discussed  regular exercise, BP monitoring, weight control to achieve/maintain BMI less than 25 and discussed med and SE's. Recommended labs to assess and monitor clinical status with further disposition pending results of labs.  I discussed the assessment and treatment plan with the patient. The patient was provided an opportunity to ask questions and all were answered. The patient agreed with the plan and demonstrated an understanding of the instructions.  I provided over 30 minutes of exam, counseling, chart review and  complex critical decision making.  Marinus MawWilliam D Jayma Volpi, MD

## 2019-02-04 ENCOUNTER — Ambulatory Visit (INDEPENDENT_AMBULATORY_CARE_PROVIDER_SITE_OTHER): Payer: Commercial Managed Care - PPO | Admitting: Internal Medicine

## 2019-02-04 ENCOUNTER — Other Ambulatory Visit: Payer: Self-pay

## 2019-02-04 VITALS — BP 130/80 | HR 64 | Temp 97.2°F | Resp 16 | Ht 69.0 in | Wt 172.2 lb

## 2019-02-04 DIAGNOSIS — E559 Vitamin D deficiency, unspecified: Secondary | ICD-10-CM

## 2019-02-04 DIAGNOSIS — E782 Mixed hyperlipidemia: Secondary | ICD-10-CM

## 2019-02-04 DIAGNOSIS — R7309 Other abnormal glucose: Secondary | ICD-10-CM | POA: Diagnosis not present

## 2019-02-04 DIAGNOSIS — I10 Essential (primary) hypertension: Secondary | ICD-10-CM | POA: Diagnosis not present

## 2019-02-04 DIAGNOSIS — Z79899 Other long term (current) drug therapy: Secondary | ICD-10-CM

## 2019-02-05 LAB — COMPLETE METABOLIC PANEL WITH GFR
AG Ratio: 1.9 (calc) (ref 1.0–2.5)
ALT: 25 U/L (ref 9–46)
AST: 24 U/L (ref 10–35)
Albumin: 4.4 g/dL (ref 3.6–5.1)
Alkaline phosphatase (APISO): 52 U/L (ref 35–144)
BUN: 9 mg/dL (ref 7–25)
CO2: 26 mmol/L (ref 20–32)
Calcium: 10 mg/dL (ref 8.6–10.3)
Chloride: 103 mmol/L (ref 98–110)
Creat: 1 mg/dL (ref 0.70–1.33)
GFR, Est African American: 97 mL/min/{1.73_m2} (ref >=60)
GFR, Est Non African American: 84 mL/min/{1.73_m2} (ref >=60)
Globulin: 2.3 g/dL (calc) (ref 1.9–3.7)
Glucose, Bld: 81 mg/dL (ref 65–99)
Potassium: 4.5 mmol/L (ref 3.5–5.3)
Sodium: 139 mmol/L (ref 135–146)
Total Bilirubin: 0.4 mg/dL (ref 0.2–1.2)
Total Protein: 6.7 g/dL (ref 6.1–8.1)

## 2019-02-05 LAB — TSH: TSH: 1.73 mIU/L (ref 0.40–4.50)

## 2019-02-05 LAB — CBC WITH DIFFERENTIAL/PLATELET
Absolute Monocytes: 556 cells/uL (ref 200–950)
Basophils Absolute: 52 cells/uL (ref 0–200)
Basophils Relative: 1.3 %
Eosinophils Absolute: 132 cells/uL (ref 15–500)
Eosinophils Relative: 3.3 %
HCT: 38.6 % (ref 38.5–50.0)
Hemoglobin: 13.1 g/dL — ABNORMAL LOW (ref 13.2–17.1)
Lymphs Abs: 1332 cells/uL (ref 850–3900)
MCH: 30.3 pg (ref 27.0–33.0)
MCHC: 33.9 g/dL (ref 32.0–36.0)
MCV: 89.4 fL (ref 80.0–100.0)
MPV: 10.2 fL (ref 7.5–12.5)
Monocytes Relative: 13.9 %
Neutro Abs: 1928 cells/uL (ref 1500–7800)
Neutrophils Relative %: 48.2 %
Platelets: 259 10*3/uL (ref 140–400)
RBC: 4.32 10*6/uL (ref 4.20–5.80)
RDW: 15 % (ref 11.0–15.0)
Total Lymphocyte: 33.3 %
WBC: 4 10*3/uL (ref 3.8–10.8)

## 2019-02-05 LAB — HEMOGLOBIN A1C
Hgb A1c MFr Bld: 5.1 % of total Hgb (ref <5.7)
Mean Plasma Glucose: 100 (calc)
eAG (mmol/L): 5.5 (calc)

## 2019-02-05 LAB — LIPID PANEL
Cholesterol: 194 mg/dL (ref <200)
HDL: 68 mg/dL (ref >=40)
LDL Cholesterol (Calc): 98 mg/dL (calc)
Non-HDL Cholesterol (Calc): 126 mg/dL (calc) (ref <130)
Total CHOL/HDL Ratio: 2.9 (calc) (ref <5.0)
Triglycerides: 182 mg/dL — ABNORMAL HIGH (ref <150)

## 2019-02-05 LAB — INSULIN, RANDOM: Insulin: 2.7 u[IU]/mL

## 2019-02-05 LAB — MAGNESIUM: Magnesium: 2.1 mg/dL (ref 1.5–2.5)

## 2019-02-05 LAB — VITAMIN D 25 HYDROXY (VIT D DEFICIENCY, FRACTURES): Vit D, 25-Hydroxy: 81 ng/mL (ref 30–100)

## 2019-02-09 ENCOUNTER — Other Ambulatory Visit: Payer: Self-pay | Admitting: Internal Medicine

## 2019-02-09 DIAGNOSIS — M5442 Lumbago with sciatica, left side: Secondary | ICD-10-CM

## 2019-02-09 DIAGNOSIS — G8929 Other chronic pain: Secondary | ICD-10-CM

## 2019-02-09 DIAGNOSIS — E782 Mixed hyperlipidemia: Secondary | ICD-10-CM

## 2019-02-09 DIAGNOSIS — I10 Essential (primary) hypertension: Secondary | ICD-10-CM

## 2019-02-09 DIAGNOSIS — F32A Depression, unspecified: Secondary | ICD-10-CM

## 2019-02-09 DIAGNOSIS — F329 Major depressive disorder, single episode, unspecified: Secondary | ICD-10-CM

## 2019-02-09 MED ORDER — TIZANIDINE HCL 2 MG PO TABS: ORAL_TABLET | ORAL | 0 refills | Status: DC

## 2019-02-09 MED ORDER — ATENOLOL 100 MG PO TABS: ORAL_TABLET | ORAL | 3 refills | Status: DC

## 2019-02-09 MED ORDER — ROSUVASTATIN CALCIUM 20 MG PO TABS: ORAL_TABLET | ORAL | 3 refills | Status: DC

## 2019-02-09 MED ORDER — BUPROPION HCL ER (XL) 150 MG PO TB24: ORAL_TABLET | ORAL | 3 refills | Status: DC

## 2019-02-16 ENCOUNTER — Other Ambulatory Visit: Payer: Self-pay | Admitting: Internal Medicine

## 2019-04-22 ENCOUNTER — Other Ambulatory Visit: Payer: Self-pay | Admitting: Internal Medicine

## 2019-04-22 DIAGNOSIS — E782 Mixed hyperlipidemia: Secondary | ICD-10-CM

## 2019-04-22 MED ORDER — ROSUVASTATIN CALCIUM 20 MG PO TABS: ORAL_TABLET | ORAL | 3 refills | Status: DC

## 2019-04-24 ENCOUNTER — Other Ambulatory Visit: Payer: Self-pay | Admitting: Internal Medicine

## 2019-04-24 DIAGNOSIS — E782 Mixed hyperlipidemia: Secondary | ICD-10-CM

## 2019-04-24 MED ORDER — ROSUVASTATIN CALCIUM 20 MG PO TABS: ORAL_TABLET | ORAL | 3 refills | Status: DC

## 2019-04-27 ENCOUNTER — Ambulatory Visit: Payer: Commercial Managed Care - PPO | Attending: Internal Medicine

## 2019-04-27 DIAGNOSIS — Z23 Encounter for immunization: Secondary | ICD-10-CM | POA: Insufficient documentation

## 2019-04-27 NOTE — Progress Notes (Signed)
   Covid-19 Vaccination Clinic  Name:  Jermaine Stewart    MRN: [id number] DOB: 26-Jul-1960  04/27/2019  Mr. Jermaine Stewart was observed post Covid-19 immunization for 15 minutes without incident. He was provided with Vaccine Information Sheet and instruction to access the V-Safe system.   Mr. Jermaine Stewart was instructed to call 911 with any severe reactions post vaccine: Marland Kitchen Difficulty breathing  . Swelling of face and throat  . A fast heartbeat  . A bad rash all over body  . Dizziness and weakness   Immunizations Administered    Name Date Dose VIS Date Route   Pfizer COVID-19 Vaccine 04/27/2019 11:08 AM 0.3 mL 01/31/2019 Intramuscular   Manufacturer: [employer], [employer]   Lot: [id number]   NDC: [address]-4

## 2019-04-28 ENCOUNTER — Other Ambulatory Visit: Payer: Self-pay | Admitting: Internal Medicine

## 2019-04-28 DIAGNOSIS — E782 Mixed hyperlipidemia: Secondary | ICD-10-CM

## 2019-04-28 MED ORDER — ROSUVASTATIN CALCIUM 20 MG PO TABS: ORAL_TABLET | ORAL | 3 refills | Status: DC

## 2019-05-12 ENCOUNTER — Other Ambulatory Visit: Payer: Self-pay | Admitting: Adult Health

## 2019-05-12 DIAGNOSIS — K130 Diseases of lips: Secondary | ICD-10-CM

## 2019-05-15 ENCOUNTER — Ambulatory Visit: Payer: Commercial Managed Care - PPO | Admitting: Adult Health

## 2019-05-16 ENCOUNTER — Ambulatory Visit: Payer: Commercial Managed Care - PPO | Admitting: Adult Health

## 2019-05-28 ENCOUNTER — Ambulatory Visit: Payer: Commercial Managed Care - PPO | Attending: Internal Medicine

## 2019-05-28 DIAGNOSIS — Z23 Encounter for immunization: Secondary | ICD-10-CM

## 2019-05-28 NOTE — Progress Notes (Signed)
   Covid-19 Vaccination Clinic  Name:  [name]    MRN: [id number] DOB: Oct 12, 1960  05/28/2019  [name] was observed post Covid-19 immunization for 15 minutes without incident. He was provided with Vaccine Information Sheet and instruction to access the V-Safe system.   [name] was instructed to call 911 with any severe reactions post vaccine: Marland Kitchen Difficulty breathing  . Swelling of face and throat  . A fast heartbeat  . A bad rash all over body  . Dizziness and weakness   Immunizations Administered    Name Date Dose VIS Date Route   Pfizer COVID-19 Vaccine 05/28/2019  4:11 PM 0.3 mL 01/31/2019 Intramuscular   Manufacturer: [employer], [employer]   Lot: [id number]   NDC: [address]-2

## 2019-06-04 ENCOUNTER — Other Ambulatory Visit: Payer: Self-pay | Admitting: Orthopedic Surgery

## 2019-06-05 ENCOUNTER — Other Ambulatory Visit: Payer: Self-pay | Admitting: Internal Medicine

## 2019-06-24 NOTE — Patient Instructions (Addendum)
DUE TO COVID-19 ONLY ONE VISITOR IS ALLOWED TO COME WITH YOU AND STAY IN THE WAITING ROOM ONLY DURING PRE OP AND PROCEDURE DAY OF SURGERY. THE 1 VISITOR MAY VISIT WITH YOU AFTER SURGERY IN YOUR PRIVATE ROOM DURING VISITING HOURS ONLY!  YOU NEED TO HAVE A COVID 19 TEST ON: 07/03/19@ 2:40 pm , THIS TEST MUST BE DONE BEFORE SURGERY, COME  [address], [place] , [address].  [facility]) ONCE YOUR COVID TEST IS COMPLETED, PLEASE BEGIN THE QUARANTINE INSTRUCTIONS AS OUTLINED IN YOUR HANDOUT.                Lionell Matuszak Hada   Your procedure is scheduled on: 07/07/19   Report to [facility] Main  Entrance   Report to admitting at : 12:00 PM     Call this number if you have problems the morning of surgery [phone]    Remember:   NO SOLID FOOD AFTER MIDNIGHT THE NIGHT PRIOR TO SURGERY. NOTHING BY MOUTH EXCEPT CLEAR LIQUIDS UNTIL: 11:20 am . PLEASE FINISH ENSURE DRINK PER SURGEON ORDER  WHICH NEEDS TO BE COMPLETED AT: 11:20 am .   CLEAR LIQUID DIET   Foods Allowed                                                                     Foods Excluded  Coffee and tea, regular and decaf                             liquids that you cannot  Plain Jell-O any favor except red or purple                                           see through such as: Fruit ices (not with fruit pulp)                                     milk, soups, orange juice  Iced Popsicles                                    All solid food Carbonated beverages, regular and diet                                    Cranberry, grape and apple juices Sports drinks like Gatorade Lightly seasoned clear broth or consume(fat free) Sugar, honey syrup  Sample Menu Breakfast                                Lunch                                     Supper Cranberry juice  Beef broth                            Chicken broth Jell-O                                     Grape juice                            Apple juice Coffee or tea                        Jell-O                                      Popsicle                                                Coffee or tea                        Coffee or tea  _____________________________________________________________________  BRUSH YOUR TEETH MORNING OF SURGERY AND RINSE YOUR MOUTH OUT, NO CHEWING GUM CANDY OR MINTS.     Take these medicines the morning of surgery with A SIP OF WATER: bupropion,fenofibrate,omeprazole                                 You may not have any metal on your body including hair pins and              piercings  Do not wear jewelry, lotions, powders or perfumes, deodorant             Men may shave face and neck.   Do not bring valuables to the hospital. [place].  Contacts, dentures or bridgework may not be worn into surgery.  Leave suitcase in the car. After surgery it may be brought to your room.     Patients discharged the day of surgery will not be allowed to drive home. IF YOU ARE HAVING SURGERY AND GOING HOME THE SAME DAY, YOU MUST HAVE AN ADULT TO DRIVE YOU HOME AND BE WITH YOU FOR 24 HOURS. YOU MAY GO HOME BY TAXI OR UBER OR ORTHERWISE, BUT AN ADULT MUST ACCOMPANY YOU HOME AND STAY WITH YOU FOR 24 HOURS.  Name and phone number of your driver:  Special Instructions: N/A              Please read over the following fact sheets you were given: _____________________________________________________________________             [facility] - Preparing for Surgery Before surgery, you can play an important role.  Because skin is not sterile, your skin needs to be as free of germs as possible.  You can reduce the number of germs on your skin by washing with CHG (chlorahexidine gluconate) soap before surgery.  CHG is an antiseptic cleaner which kills germs and bonds with the  skin to continue killing germs even after washing. Please DO NOT use if you have an  allergy to CHG or antibacterial soaps.  If your skin becomes reddened/irritated stop using the CHG and inform your nurse when you arrive at Short Stay. Do not shave (including legs and underarms) for at least 48 hours prior to the first CHG shower.  You may shave your face/neck. Please follow these instructions carefully:  1.  Shower with CHG Soap the night before surgery and the  morning of Surgery.  2.  If you choose to wash your hair, wash your hair first as usual with your  normal  shampoo.  3.  After you shampoo, rinse your hair and body thoroughly to remove the  shampoo.                           4.  Use CHG as you would any other liquid soap.  You can apply chg directly  to the skin and wash                       Gently with a scrungie or clean washcloth.  5.  Apply the CHG Soap to your body ONLY FROM THE NECK DOWN.   Do not use on face/ open                           Wound or open sores. Avoid contact with eyes, ears mouth and genitals (private parts).                       Wash face,  Genitals (private parts) with your normal soap.             6.  Wash thoroughly, paying special attention to the area where your surgery  will be performed.  7.  Thoroughly rinse your body with warm water from the neck down.  8.  DO NOT shower/wash with your normal soap after using and rinsing off  the CHG Soap.                9.  Pat yourself dry with a clean towel.            10.  Wear clean pajamas.            11.  Place clean sheets on your bed the night of your first shower and do not  sleep with pets. Day of Surgery : Do not apply any lotions/deodorants the morning of surgery.  Please wear clean clothes to the hospital/surgery center.  FAILURE TO FOLLOW THESE INSTRUCTIONS MAY RESULT IN THE CANCELLATION OF YOUR SURGERY PATIENT SIGNATURE_________________________________  NURSE  SIGNATURE__________________________________  ________________________________________________________________________   Rogelia Mire  An incentive spirometer is a tool that can help keep your lungs clear and active. This tool measures how well you are filling your lungs with each breath. Taking long deep breaths may help reverse or decrease the chance of developing breathing (pulmonary) problems (especially infection) following:  A long period of time when you are unable to move or be active. BEFORE THE PROCEDURE   If the spirometer includes an indicator to show your best effort, your nurse or respiratory therapist will set it to a desired goal.  If possible, sit up straight or lean slightly forward. Try not to slouch.  Hold the incentive spirometer in an upright position. INSTRUCTIONS FOR USE  1. Sit on the edge of your bed if possible, or sit up as far as you can in bed or on a chair. 2. Hold the incentive spirometer in an upright position. 3. Breathe out normally. 4. Place the mouthpiece in your mouth and seal your lips tightly around it. 5. Breathe in slowly and as deeply as possible, raising the piston or the ball toward the top of the column. 6. Hold your breath for 3-5 seconds or for as long as possible. Allow the piston or ball to fall to the bottom of the column. 7. Remove the mouthpiece from your mouth and breathe out normally. 8. Rest for a few seconds and repeat Steps 1 through 7 at least 10 times every 1-2 hours when you are awake. Take your time and take a few normal breaths between deep breaths. 9. The spirometer may include an indicator to show your best effort. Use the indicator as a goal to work toward during each repetition. 10. After each set of 10 deep breaths, practice coughing to be sure your lungs are clear. If you have an incision (the cut made at the time of surgery), support your incision when coughing by placing a pillow or rolled up towels firmly  against it. Once you are able to get out of bed, walk around indoors and cough well. You may stop using the incentive spirometer when instructed by your caregiver.  RISKS AND COMPLICATIONS  Take your time so you do not get dizzy or light-headed.  If you are in pain, you may need to take or ask for pain medication before doing incentive spirometry. It is harder to take a deep breath if you are having pain. AFTER USE  Rest and breathe slowly and easily.  It can be helpful to keep track of a log of your progress. Your caregiver can provide you with a simple table to help with this. If you are using the spirometer at home, follow these instructions: [place] IF:   You are having difficultly using the spirometer.  You have trouble using the spirometer as often as instructed.  Your pain medication is not giving enough relief while using the spirometer.  You develop fever of 100.5 F (38.1 C) or higher. SEEK IMMEDIATE MEDICAL CARE IF:   You cough up bloody sputum that had not been present before.  You develop fever of 102 F (38.9 C) or greater.  You develop worsening pain at or near the incision site. MAKE SURE YOU:   Understand these instructions.  Will watch your condition.  Will get help right away if you are not doing well or get worse. Document Released: 06/19/2006 Document Revised: 05/01/2011 Document Reviewed: 08/20/2006 [facility] Patient Information 2014 [place], [place].   ________________________________________________________________________

## 2019-06-25 ENCOUNTER — Encounter (HOSPITAL_COMMUNITY)
Admission: RE | Admit: 2019-06-25 | Discharge: 2019-06-25 | Disposition: A | Discharge status: Home / Self Care | Payer: Commercial Managed Care - PPO | Source: Ambulatory Visit | Attending: Orthopedic Surgery | Admitting: Orthopedic Surgery

## 2019-06-25 ENCOUNTER — Encounter (HOSPITAL_COMMUNITY): Payer: Self-pay

## 2019-06-25 ENCOUNTER — Other Ambulatory Visit: Payer: Self-pay

## 2019-06-25 ENCOUNTER — Ambulatory Visit (HOSPITAL_COMMUNITY)
Admission: RE | Admit: 2019-06-25 | Discharge: 2019-06-25 | Disposition: A | Discharge status: Home / Self Care | Payer: Commercial Managed Care - PPO | Source: Ambulatory Visit | Attending: Orthopedic Surgery | Admitting: Orthopedic Surgery

## 2019-06-25 DIAGNOSIS — Z01818 Encounter for other preprocedural examination: Secondary | ICD-10-CM

## 2019-06-25 LAB — TYPE AND SCREEN
ABO/RH(D): A POS
Antibody Screen: NEGATIVE

## 2019-06-25 LAB — CBC WITH DIFFERENTIAL/PLATELET
Abs Immature Granulocytes: 0.05 10*3/uL (ref 0.00–0.07)
Basophils Absolute: 0 10*3/uL (ref 0.0–0.1)
Basophils Relative: 1 %
Eosinophils Absolute: 0.1 10*3/uL (ref 0.0–0.5)
Eosinophils Relative: 2 %
HCT: 40.3 % (ref 39.0–52.0)
Hemoglobin: 13.4 g/dL (ref 13.0–17.0)
Immature Granulocytes: 1 %
Lymphocytes Relative: 24 %
Lymphs Abs: 1.4 10*3/uL (ref 0.7–4.0)
MCH: 31.8 pg (ref 26.0–34.0)
MCHC: 33.3 g/dL (ref 30.0–36.0)
MCV: 95.7 fL (ref 80.0–100.0)
Monocytes Absolute: 0.6 10*3/uL (ref 0.1–1.0)
Monocytes Relative: 11 %
Neutro Abs: 3.4 10*3/uL (ref 1.7–7.7)
Neutrophils Relative %: 61 %
Platelets: 229 10*3/uL (ref 150–400)
RBC: 4.21 MIL/uL — ABNORMAL LOW (ref 4.22–5.81)
RDW: 11.9 % (ref 11.5–15.5)
WBC: 5.5 10*3/uL (ref 4.0–10.5)
nRBC: 0 % (ref 0.0–0.2)

## 2019-06-25 LAB — URINALYSIS, ROUTINE W REFLEX MICROSCOPIC
Bilirubin Urine: NEGATIVE
Glucose, UA: NEGATIVE mg/dL
Hgb urine dipstick: NEGATIVE
Ketones, ur: NEGATIVE mg/dL
Leukocytes,Ua: NEGATIVE
Nitrite: NEGATIVE
Protein, ur: NEGATIVE mg/dL
Specific Gravity, Urine: 1.013 (ref 1.005–1.030)
pH: 6 (ref 5.0–8.0)

## 2019-06-25 LAB — PROTIME-INR
INR: 1.1 (ref 0.8–1.2)
Prothrombin Time: 13.9 seconds (ref 11.4–15.2)

## 2019-06-25 LAB — BASIC METABOLIC PANEL
Anion gap: 9 (ref 5–15)
BUN: 11 mg/dL (ref 6–20)
CO2: 25 mmol/L (ref 22–32)
Calcium: 9.1 mg/dL (ref 8.9–10.3)
Chloride: 105 mmol/L (ref 98–111)
Creatinine, Ser: 1 mg/dL (ref 0.61–1.24)
GFR calc Af Amer: >60 mL/min (ref >60)
GFR calc non Af Amer: >60 mL/min (ref >60)
Glucose, Bld: 87 mg/dL (ref 70–99)
Potassium: 4 mmol/L (ref 3.5–5.1)
Sodium: 139 mmol/L (ref 135–145)

## 2019-06-25 LAB — SURGICAL PCR SCREEN
MRSA, PCR: NEGATIVE
Staphylococcus aureus: POSITIVE — AB

## 2019-06-25 LAB — APTT: aPTT: 26 seconds (ref 24–36)

## 2019-06-25 NOTE — Progress Notes (Signed)
PCP -  Dr. Rosalita Levan.. LOV: 02/04/19 Cardiologist -   Chest x-ray - 09/26/18 EKG - 07/31/18 Stress Test -  ECHO -  Cardiac Cath -   Sleep Study -  CPAP -   Fasting Blood Sugar -  Checks Blood Sugar _____ times a day  Blood Thinner Instructions: Aspirin Instructions: Last Dose:  Anesthesia review:   Patient denies shortness of breath, fever, cough and chest pain at PAT appointment   Patient verbalized understanding of instructions that were given to them at the PAT appointment. Patient was also instructed that they will need to review over the PAT instructions again at home before surgery.

## 2019-06-26 NOTE — Progress Notes (Signed)
PCR results: positive for STAPH. 

## 2019-07-03 ENCOUNTER — Other Ambulatory Visit (HOSPITAL_COMMUNITY): Payer: Commercial Managed Care - PPO

## 2019-07-04 DIAGNOSIS — T8484XA Pain due to internal orthopedic prosthetic devices, implants and grafts, initial encounter: Secondary | ICD-10-CM | POA: Diagnosis present

## 2019-07-04 DIAGNOSIS — Z96651 Presence of right artificial knee joint: Secondary | ICD-10-CM | POA: Diagnosis present

## 2019-07-04 NOTE — H&P (Signed)
TOTAL KNEE REVISION ADMISSION H&P  Patient is being admitted for right revision total knee arthroplasty.  Subjective:  Chief Complaint:right knee pain.  HPI: Jermaine Stewart, 59 y.o. male, has a history of pain and functional disability in the right knee(s) due to failed previous arthroplasty and patient has failed non-surgical conservative treatments for greater than 12 weeks to include NSAID's and/or analgesics, flexibility and strengthening excercises, use of assistive devices, weight reduction as appropriate and activity modification. The indications for the revision of the total knee arthroplasty are loosening of one or more components. Onset of symptoms was abrupt starting 1 years ago with rapidlly worsening course since that time.  Prior procedures on the right knee(s) include arthroplasty.  Patient currently rates pain in the right knee(s) at 10 out of 10 with activity. There is night pain, worsening of pain with activity and weight bearing, pain that interferes with activities of daily living, pain with passive range of motion and joint swelling.  Patient has evidence of prosthetic loosening by imaging studies. This condition presents safety issues increasing the risk of falls.  There is no current active infection.  Patient Active Problem List   Diagnosis Date Noted  . Painful total knee replacement, right (HCC) 07/04/2019  . 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 Loose left total knee arthroplasty (HCC) 09/26/2018  . Acute on chronic anemia 04/23/2018  . GERD (gastroesophageal reflux disease) 04/19/2018  . Radiculopathy 03/28/2018  . Spinal stenosis, lumbar region, with neurogenic claudication 10/16/2017  . BMI 25.0-25.9,adult 10/12/2017  . Osteoarthritis of right knee 03/04/2014  . Other abnormal glucose 07/22/2013  . Medication management 07/22/2013  . Essential hypertension 03/12/2013  . Vitamin D deficiency   . Hyperlipidemia    Past  Medical History:  Diagnosis Date  . Arthritis   . Depression   . GERD (gastroesophageal reflux disease)   . Hyperlipidemia   . Hypertension     Past Surgical History:  Procedure Laterality Date  . HERNIA REPAIR Bilateral    15 years ago  . 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 TOTAL KNEE ARTHROPLASTY Right 03/04/2014   Procedure: TOTAL KNEE ARTHROPLASTY;  Surgeon: Nestor Lewandowsky, MD;  Location: [facility] OR;  Service: Orthopedics;  Laterality: Right;  . TOTAL KNEE ARTHROPLASTY Left 09/30/2018   Procedure: Revision Left Knee Arthroplasty;  Surgeon: Gean Birchwood, MD;  Location: [facility] ORS;  Service: Orthopedics;  Laterality: Left;  . TRANSFORAMINAL LUMBAR INTERBODY FUSION (TLIF) WITH PEDICLE SCREW FIXATION 1 LEVEL Left 03/28/2018   Procedure: LEFT SIDED LUMBAR FOUR-FIVE TRANSFORAMINAL LUMBAR INTERBODY FUSION WITH INSTRUMENTATION AND ALLOGRAFT;  Surgeon: Estill Bamberg, MD;  Location: [facility] OR;  Service: Orthopedics;  Laterality: Left;  Marland Kitchen VASECTOMY      No current facility-administered medications for this encounter.   Current Outpatient Medications  Medication Sig Dispense Refill Last Dose  . atenolol (TENORMIN) 100 MG tablet Take 1 tablet at Bedtime for BP (Patient taking differently: Take 100 mg by mouth at bedtime. Take 1 tablet at Bedtime for BP) 90 tablet 3   . buPROPion (WELLBUTRIN XL) 150 MG 24 hr tablet Take 1 tablet every Morning for Mood, Focus & Concentration (Patient taking differently: Take 150 mg by mouth daily. Take 1 tablet every Morning for Mood, Focus & Concentration) 90 tablet 3   . Cholecalciferol (VITAMIN D-3) 5000 UNITS TABS Take 15,000  Units by mouth daily.      Marland Kitchen ezetimibe (ZETIA) 10 MG tablet Take 1 tablet Daily for Cholestrol (Patient taking differently: Take 10 mg by mouth daily. Take 1 tablet Daily for Cholestrol) 90 tablet 3   . fenofibrate (TRICOR)  145 MG tablet TAKE 1 TABLET DAILY FOR BLOOD FATS (Patient taking differently: Take 145 mg by mouth daily. Take 1 tablet daily for Blood Fats) 90 tablet 3   . fluticasone (FLONASE) 50 MCG/ACT nasal spray Use 1 to Sprays each nares 1 to 2 x /day (Patient taking differently: Place 1 spray into both nostrils daily as needed for allergies. ) 48 g 3   . meloxicam (MOBIC) 15 MG tablet Take 15 mg by mouth daily.     . rosuvastatin (CRESTOR) 20 MG tablet Take 1 tablet 2 x  /week for Cholesterol. (Patient taking differently: Take 20 mg by mouth See admin instructions. Take 1 tablet 2 x  /week for Cholesterol. Tues and Thursdays) 26 tablet 3   . tiZANidine (ZANAFLEX) 2 MG tablet Take 1/2 - 1 tablet 3 x /day ONLY  if needed for Muscle Spasm (Patient taking differently: Take 2 mg by mouth every 8 (eight) hours as needed for muscle spasms. Take 1/2 - 1 tablet 3 x /day ONLY  if needed for Muscle Spasm) 180 tablet 0   . zinc gluconate 50 MG tablet Take 50 mg by mouth daily.     Marland Kitchen omeprazole (PRILOSEC) 40 MG capsule Take 1 tablet 2 x /day for Acid Indigestion & Reflux (Patient not taking: Reported on 06/16/2019) 180 capsule 3 Not Taking at Unknown time   Allergies  Allergen Reactions  . Atorvastatin Other (See Comments)    Arthralgias  . Percocet [Oxycodone-Acetaminophen] Other (See Comments)    Hallucinates    Social History   Tobacco Use  . Smoking status: Never Smoker  . Smokeless tobacco: Former Systems developer    Types: Chew  Substance Use Topics  . Alcohol use: Yes    Alcohol/week: 14.0 standard drinks    Types: 14 Cans of beer per week    Comment: 2-3 beer a day    Family History  Problem Relation Age of Onset  . Breast cancer Mother   . Throat cancer Father   . Hypertension Brother   . Stroke Brother   . Hypertension Brother   . Hypertension Brother   . Colon cancer Neg Hx   . Stomach cancer Neg Hx   . Rectal cancer Neg Hx       Review of Systems  Constitutional: Negative.   HENT: Negative.    Eyes: Negative.   Respiratory: Negative.   Cardiovascular: Negative.   Endocrine: Negative.   Genitourinary: Negative.   Musculoskeletal: Positive for arthralgias.  Skin: Negative.   Allergic/Immunologic: Negative.   Neurological: Negative.   Hematological: Negative.   Psychiatric/Behavioral: Negative.      Objective:  Physical Exam  Constitutional: He is oriented to person, place, and time. He appears well-developed and well-nourished.  HENT:  Head: Normocephalic and atraumatic.  Eyes: Pupils are equal, round, and reactive to light.  Cardiovascular: Intact distal pulses.  Respiratory: Effort normal.  Musculoskeletal:        General: Tenderness present.     Cervical back: Normal range of motion and neck supple.     Comments: .  Patient has a 1+ effusion varus valgus instability is 2+ nontender along the joint line tender along the distal femur.  Range of motion is from 5-130.  Good quadriceps and hamstring power.  Neurological: He is alert and oriented to person, place, and time.  Skin: Skin is warm and dry.  Psychiatric: He has a normal mood and affect. His behavior is normal. Judgment and thought content normal.    Vital signs in last 24 hours:    Labs:  Estimated body mass index is 23.96 kg/m as calculated from the following:   Height as of 06/25/19: 5\' 10"  (1.778 m).   Weight as of 06/25/19: 75.8 kg.  Imaging Review Plain radiographs demonstrate  AP lateral and sunrise x-rays show that the femoral component has tilted into flexion on the distal femur very similar to what happened on the left total knee.  The tibia appears to be well fixed as does the patella.  Assessment/Plan:  End stage arthritis, right knee(s) with failed previous arthroplasty.   The patient history, physical examination, clinical judgment of the provider and imaging studies are consistent with end stage degenerative joint disease of the right knee(s), previous total knee arthroplasty. Revision  total knee arthroplasty is deemed medically necessary. The treatment options including medical management, injection therapy, arthroscopy and revision arthroplasty were discussed at length. The risks and benefits of revision total knee arthroplasty were presented and reviewed. The risks due to aseptic loosening, infection, stiffness, patella tracking problems, thromboembolic complications and other imponderables were discussed. The patient acknowledged the explanation, agreed to proceed with the plan and consent was signed. Patient is being admitted for inpatient treatment for surgery, pain control, PT, OT, prophylactic antibiotics, VTE prophylaxis, progressive ambulation and ADL's and discharge planning.The patient is planning to be discharged home with home health services

## 2019-07-05 ENCOUNTER — Other Ambulatory Visit (HOSPITAL_COMMUNITY)
Admission: RE | Admit: 2019-07-05 | Discharge: 2019-07-05 | Disposition: A | Discharge status: Home / Self Care | Payer: Commercial Managed Care - PPO | Source: Ambulatory Visit | Attending: Orthopedic Surgery | Admitting: Orthopedic Surgery

## 2019-07-05 DIAGNOSIS — Z01812 Encounter for preprocedural laboratory examination: Secondary | ICD-10-CM | POA: Insufficient documentation

## 2019-07-05 DIAGNOSIS — Z20822 Contact with and (suspected) exposure to covid-19: Secondary | ICD-10-CM | POA: Insufficient documentation

## 2019-07-05 LAB — SARS CORONAVIRUS 2 (TAT 6-24 HRS): SARS Coronavirus 2: NEGATIVE

## 2019-07-06 MED ORDER — BUPIVACAINE LIPOSOME 1.3 % IJ SUSP
20.0000 mL | INTRAMUSCULAR | Status: DC
  Filled 2019-07-06 (×2): qty 20

## 2019-07-06 MED ORDER — TRANEXAMIC ACID 1000 MG/10ML IV SOLN
2000.0000 mg | INTRAVENOUS | Status: DC
  Filled 2019-07-06: qty 20

## 2019-07-06 NOTE — Anesthesia Preprocedure Evaluation (Addendum)
Anesthesia Evaluation  Patient identified by MRN, date of birth, ID band Patient awake    Reviewed: Allergy & Precautions, NPO status , Patient's Chart, lab work & pertinent test results  Airway Mallampati: II  TM Distance: >3 FB Neck ROM: Full    Dental no notable dental hx. (+) Teeth Intact, Dental Advisory Given   Pulmonary neg pulmonary ROS,    Pulmonary exam normal breath sounds clear to auscultation       Cardiovascular Exercise Tolerance: Good hypertension, Pt. on medications Normal cardiovascular exam Rhythm:Regular Rate:Normal     Neuro/Psych  Neuromuscular disease negative neurological ROS  negative psych ROS   GI/Hepatic Neg liver ROS, GERD  ,  Endo/Other  negative endocrine ROS  Renal/GU negative Renal ROS     Musculoskeletal  (+) Arthritis , Osteoarthritis,    Abdominal   Peds  Hematology negative hematology ROS (+)   Anesthesia Other Findings   Reproductive/Obstetrics                           Anesthesia Physical Anesthesia Plan  ASA: II  Anesthesia Plan: Spinal and Regional   Post-op Pain Management:    Induction: Intravenous  PONV Risk Score and Plan: 3 and Treatment may vary due to age or medical condition, Midazolam, Dexamethasone and Ondansetron  Airway Management Planned: Nasal Cannula and Natural Airway  Additional Equipment: None  Intra-op Plan:   Post-operative Plan:   Informed Consent: I have reviewed the patients History and Physical, chart, labs and discussed the procedure including the risks, benefits and alternatives for the proposed anesthesia with the patient or authorized representative who has indicated his/her understanding and acceptance.     Dental advisory given  Plan Discussed with: CRNA and Anesthesiologist  Anesthesia Plan Comments: (GA w R adductor canal)       Anesthesia Quick Evaluation

## 2019-07-07 ENCOUNTER — Inpatient Hospital Stay (HOSPITAL_COMMUNITY): Payer: Commercial Managed Care - PPO | Admitting: Anesthesiology

## 2019-07-07 ENCOUNTER — Inpatient Hospital Stay (HOSPITAL_COMMUNITY)
Admission: RE | Admit: 2019-07-07 | Discharge: 2019-07-08 | DRG: 467 | Disposition: A | Discharge status: Home Health | Payer: Commercial Managed Care - PPO | Attending: Orthopedic Surgery | Admitting: Orthopedic Surgery

## 2019-07-07 ENCOUNTER — Encounter (HOSPITAL_COMMUNITY)
Admission: RE | Disposition: A | Discharge status: Home / Self Care | Payer: Self-pay | Source: Ambulatory Visit | Attending: Orthopedic Surgery

## 2019-07-07 ENCOUNTER — Telehealth (HOSPITAL_COMMUNITY): Payer: Self-pay | Admitting: *Deleted

## 2019-07-07 ENCOUNTER — Encounter (HOSPITAL_COMMUNITY): Payer: Self-pay | Admitting: Orthopedic Surgery

## 2019-07-07 DIAGNOSIS — Z20822 Contact with and (suspected) exposure to covid-19: Secondary | ICD-10-CM | POA: Diagnosis present

## 2019-07-07 DIAGNOSIS — Z87891 Personal history of nicotine dependence: Secondary | ICD-10-CM

## 2019-07-07 DIAGNOSIS — L905 Scar conditions and fibrosis of skin: Secondary | ICD-10-CM | POA: Diagnosis present

## 2019-07-07 DIAGNOSIS — T84032A Mechanical loosening of internal right knee prosthetic joint, initial encounter: Secondary | ICD-10-CM | POA: Diagnosis present

## 2019-07-07 DIAGNOSIS — Z981 Arthrodesis status: Secondary | ICD-10-CM

## 2019-07-07 DIAGNOSIS — Z885 Allergy status to narcotic agent status: Secondary | ICD-10-CM

## 2019-07-07 DIAGNOSIS — Z96651 Presence of right artificial knee joint: Secondary | ICD-10-CM

## 2019-07-07 DIAGNOSIS — D62 Acute posthemorrhagic anemia: Secondary | ICD-10-CM | POA: Diagnosis not present

## 2019-07-07 DIAGNOSIS — E785 Hyperlipidemia, unspecified: Secondary | ICD-10-CM | POA: Diagnosis present

## 2019-07-07 DIAGNOSIS — Z888 Allergy status to other drugs, medicaments and biological substances status: Secondary | ICD-10-CM | POA: Diagnosis not present

## 2019-07-07 DIAGNOSIS — Z9181 History of falling: Secondary | ICD-10-CM

## 2019-07-07 DIAGNOSIS — Z8249 Family history of ischemic heart disease and other diseases of the circulatory system: Secondary | ICD-10-CM | POA: Diagnosis not present

## 2019-07-07 DIAGNOSIS — Z79899 Other long term (current) drug therapy: Secondary | ICD-10-CM

## 2019-07-07 DIAGNOSIS — K219 Gastro-esophageal reflux disease without esophagitis: Secondary | ICD-10-CM | POA: Diagnosis present

## 2019-07-07 DIAGNOSIS — M21161 Varus deformity, not elsewhere classified, right knee: Secondary | ICD-10-CM | POA: Diagnosis present

## 2019-07-07 DIAGNOSIS — Y792 Prosthetic and other implants, materials and accessory orthopedic devices associated with adverse incidents: Secondary | ICD-10-CM | POA: Diagnosis present

## 2019-07-07 DIAGNOSIS — M1990 Unspecified osteoarthritis, unspecified site: Secondary | ICD-10-CM | POA: Diagnosis present

## 2019-07-07 DIAGNOSIS — Z791 Long term (current) use of non-steroidal anti-inflammatories (NSAID): Secondary | ICD-10-CM

## 2019-07-07 DIAGNOSIS — E559 Vitamin D deficiency, unspecified: Secondary | ICD-10-CM | POA: Diagnosis present

## 2019-07-07 DIAGNOSIS — T8484XA Pain due to internal orthopedic prosthetic devices, implants and grafts, initial encounter: Secondary | ICD-10-CM | POA: Diagnosis present

## 2019-07-07 DIAGNOSIS — F329 Major depressive disorder, single episode, unspecified: Secondary | ICD-10-CM | POA: Diagnosis present

## 2019-07-07 DIAGNOSIS — I10 Essential (primary) hypertension: Secondary | ICD-10-CM | POA: Diagnosis present

## 2019-07-07 DIAGNOSIS — T84052A Periprosthetic osteolysis of internal prosthetic right knee joint, initial encounter: Secondary | ICD-10-CM | POA: Diagnosis present

## 2019-07-07 HISTORY — PX: TOTAL KNEE REVISION: SHX996

## 2019-07-07 SURGERY — TOTAL KNEE REVISION
Anesthesia: Regional | Site: Knee | Laterality: Right

## 2019-07-07 MED ORDER — PROPOFOL 500 MG/50ML IV EMUL
INTRAVENOUS | Status: AC
  Filled 2019-07-07: qty 50

## 2019-07-07 MED ORDER — PANTOPRAZOLE SODIUM 40 MG PO TBEC
40.0000 mg | DELAYED_RELEASE_TABLET | Freq: Every day | ORAL | Status: DC
  Administered 2019-07-07 – 2019-07-08 (×2): 40 mg via ORAL
  Filled 2019-07-07 (×2): qty 1

## 2019-07-07 MED ORDER — FLEET ENEMA 7-19 GM/118ML RE ENEM: 1.0000 | ENEMA | Freq: Once | RECTAL | Status: DC | PRN

## 2019-07-07 MED ORDER — KCL IN DEXTROSE-NACL 20-5-0.45 MEQ/L-%-% IV SOLN
INTRAVENOUS | Status: DC
  Filled 2019-07-07 (×3): qty 1000

## 2019-07-07 MED ORDER — ACETAMINOPHEN 325 MG PO TABS: 325.0000 mg | ORAL_TABLET | Freq: Four times a day (QID) | ORAL | Status: DC | PRN

## 2019-07-07 MED ORDER — CEFAZOLIN SODIUM-DEXTROSE 2-4 GM/100ML-% IV SOLN
2.0000 g | INTRAVENOUS | Status: AC
  Administered 2019-07-07: 2 g via INTRAVENOUS

## 2019-07-07 MED ORDER — PHENYLEPHRINE 40 MCG/ML (10ML) SYRINGE FOR IV PUSH (FOR BLOOD PRESSURE SUPPORT)
PREFILLED_SYRINGE | INTRAVENOUS | Status: AC
  Filled 2019-07-07: qty 10

## 2019-07-07 MED ORDER — SODIUM CHLORIDE (PF) 0.9 % IJ SOLN
INTRAMUSCULAR | Status: AC
  Filled 2019-07-07: qty 20

## 2019-07-07 MED ORDER — ONDANSETRON HCL 4 MG/2ML IJ SOLN
INTRAMUSCULAR | Status: DC | PRN
  Administered 2019-07-07: 4 mg via INTRAVENOUS

## 2019-07-07 MED ORDER — FENTANYL CITRATE (PF) 100 MCG/2ML IJ SOLN
50.0000 ug | Freq: Once | INTRAMUSCULAR | Status: AC
  Administered 2019-07-07: 100 ug via INTRAVENOUS
  Filled 2019-07-07: qty 2

## 2019-07-07 MED ORDER — MEPERIDINE HCL 50 MG/ML IJ SOLN: 6.2500 mg | INTRAMUSCULAR | Status: DC | PRN

## 2019-07-07 MED ORDER — SODIUM CHLORIDE (PF) 0.9 % IJ SOLN
INTRAMUSCULAR | Status: AC
  Filled 2019-07-07: qty 50

## 2019-07-07 MED ORDER — BUPIVACAINE LIPOSOME 1.3 % IJ SUSP
INTRAMUSCULAR | Status: DC | PRN
  Administered 2019-07-07: 20 mL

## 2019-07-07 MED ORDER — HYDROMORPHONE HCL 2 MG PO TABS: 2.0000 mg | ORAL_TABLET | ORAL | 0 refills | Status: DC | PRN

## 2019-07-07 MED ORDER — ASPIRIN 81 MG PO CHEW
81.0000 mg | CHEWABLE_TABLET | Freq: Two times a day (BID) | ORAL | Status: DC
  Administered 2019-07-07 – 2019-07-08 (×2): 81 mg via ORAL
  Filled 2019-07-07 (×2): qty 1

## 2019-07-07 MED ORDER — VITAMIN D (ERGOCALCIFEROL) 1.25 MG (50000 UNIT) PO CAPS
50000.0000 [IU] | ORAL_CAPSULE | ORAL | Status: DC
  Filled 2019-07-07: qty 1

## 2019-07-07 MED ORDER — LIDOCAINE 2% (20 MG/ML) 5 ML SYRINGE
INTRAMUSCULAR | Status: AC
  Filled 2019-07-07: qty 5

## 2019-07-07 MED ORDER — ONDANSETRON HCL 4 MG/2ML IJ SOLN: 4.0000 mg | Freq: Once | INTRAMUSCULAR | Status: DC | PRN

## 2019-07-07 MED ORDER — TRANEXAMIC ACID-NACL 1000-0.7 MG/100ML-% IV SOLN
1000.0000 mg | Freq: Once | INTRAVENOUS | Status: AC
  Administered 2019-07-07: 1000 mg via INTRAVENOUS
  Filled 2019-07-07: qty 100

## 2019-07-07 MED ORDER — DEXAMETHASONE SODIUM PHOSPHATE 10 MG/ML IJ SOLN
INTRAMUSCULAR | Status: DC | PRN
  Administered 2019-07-07: 10 mg via INTRAVENOUS

## 2019-07-07 MED ORDER — OXYCODONE HCL 5 MG PO TABS: 5.0000 mg | ORAL_TABLET | Freq: Once | ORAL | Status: DC | PRN

## 2019-07-07 MED ORDER — ROPIVACAINE HCL 7.5 MG/ML IJ SOLN
INTRAMUSCULAR | Status: DC | PRN
Start: 2019-07-07 — End: 2019-07-07
  Administered 2019-07-07: 20 mL via PERINEURAL

## 2019-07-07 MED ORDER — DIPHENHYDRAMINE HCL 12.5 MG/5ML PO ELIX: 12.5000 mg | ORAL_SOLUTION | ORAL | Status: DC | PRN

## 2019-07-07 MED ORDER — POVIDONE-IODINE 10 % EX SWAB
2.0000 "application " | Freq: Once | CUTANEOUS | Status: AC
  Administered 2019-07-07: 2 via TOPICAL

## 2019-07-07 MED ORDER — FENOFIBRATE 160 MG PO TABS
160.0000 mg | ORAL_TABLET | Freq: Every day | ORAL | Status: DC
  Administered 2019-07-07 – 2019-07-08 (×2): 160 mg via ORAL
  Filled 2019-07-07 (×2): qty 1

## 2019-07-07 MED ORDER — DEXAMETHASONE SODIUM PHOSPHATE 10 MG/ML IJ SOLN
INTRAMUSCULAR | Status: AC
  Filled 2019-07-07: qty 2

## 2019-07-07 MED ORDER — PHENOL 1.4 % MT LIQD: 1.0000 | OROMUCOSAL | Status: DC | PRN

## 2019-07-07 MED ORDER — GABAPENTIN 100 MG PO CAPS
100.0000 mg | ORAL_CAPSULE | Freq: Three times a day (TID) | ORAL | Status: DC
  Administered 2019-07-07 – 2019-07-08 (×3): 100 mg via ORAL
  Filled 2019-07-07 (×3): qty 1

## 2019-07-07 MED ORDER — DOCUSATE SODIUM 100 MG PO CAPS
100.0000 mg | ORAL_CAPSULE | Freq: Two times a day (BID) | ORAL | Status: DC
  Administered 2019-07-07 – 2019-07-08 (×2): 100 mg via ORAL
  Filled 2019-07-07 (×2): qty 1

## 2019-07-07 MED ORDER — TRANEXAMIC ACID 1000 MG/10ML IV SOLN
INTRAVENOUS | Status: DC | PRN
  Administered 2019-07-07: 2000 mg via TOPICAL

## 2019-07-07 MED ORDER — BUPIVACAINE HCL (PF) 0.25 % IJ SOLN
INTRAMUSCULAR | Status: DC | PRN
  Administered 2019-07-07: 30 mL

## 2019-07-07 MED ORDER — SODIUM CHLORIDE 0.9 % IR SOLN
Status: DC | PRN
  Administered 2019-07-07: 1000 mL

## 2019-07-07 MED ORDER — ONDANSETRON HCL 4 MG/2ML IJ SOLN
4.0000 mg | Freq: Four times a day (QID) | INTRAMUSCULAR | Status: DC | PRN
  Administered 2019-07-07: 4 mg via INTRAVENOUS
  Filled 2019-07-07: qty 2

## 2019-07-07 MED ORDER — METHOCARBAMOL 500 MG IVPB - SIMPLE MED
500.0000 mg | Freq: Four times a day (QID) | INTRAVENOUS | Status: DC | PRN
  Filled 2019-07-07: qty 50

## 2019-07-07 MED ORDER — LIDOCAINE 2% (20 MG/ML) 5 ML SYRINGE
INTRAMUSCULAR | Status: DC | PRN
  Administered 2019-07-07: 80 mg via INTRAVENOUS

## 2019-07-07 MED ORDER — PROPOFOL 500 MG/50ML IV EMUL
INTRAVENOUS | Status: DC | PRN
  Administered 2019-07-07: 80 mg via INTRAVENOUS
  Administered 2019-07-07: 80 ug/kg/min via INTRAVENOUS

## 2019-07-07 MED ORDER — ONDANSETRON HCL 4 MG PO TABS: 4.0000 mg | ORAL_TABLET | Freq: Four times a day (QID) | ORAL | Status: DC | PRN

## 2019-07-07 MED ORDER — BUPROPION HCL ER (XL) 150 MG PO TB24
150.0000 mg | ORAL_TABLET | Freq: Every day | ORAL | Status: DC
  Administered 2019-07-08: 150 mg via ORAL
  Filled 2019-07-07: qty 1

## 2019-07-07 MED ORDER — ATENOLOL 50 MG PO TABS
100.0000 mg | ORAL_TABLET | Freq: Every day | ORAL | Status: DC
  Administered 2019-07-07: 100 mg via ORAL
  Filled 2019-07-07: qty 2

## 2019-07-07 MED ORDER — PROPOFOL 1000 MG/100ML IV EMUL
INTRAVENOUS | Status: AC
  Filled 2019-07-07: qty 100

## 2019-07-07 MED ORDER — PANTOPRAZOLE SODIUM 40 MG PO TBEC: 80.0000 mg | DELAYED_RELEASE_TABLET | Freq: Every day | ORAL | Status: DC

## 2019-07-07 MED ORDER — METOCLOPRAMIDE HCL 5 MG/ML IJ SOLN: 5.0000 mg | Freq: Three times a day (TID) | INTRAMUSCULAR | Status: DC | PRN

## 2019-07-07 MED ORDER — ONDANSETRON HCL 4 MG/2ML IJ SOLN
INTRAMUSCULAR | Status: AC
  Filled 2019-07-07: qty 4

## 2019-07-07 MED ORDER — BUPIVACAINE HCL (PF) 0.25 % IJ SOLN
INTRAMUSCULAR | Status: AC
  Filled 2019-07-07: qty 30

## 2019-07-07 MED ORDER — EPHEDRINE 5 MG/ML INJ
INTRAVENOUS | Status: AC
  Filled 2019-07-07: qty 10

## 2019-07-07 MED ORDER — PHENYLEPHRINE HCL (PRESSORS) 10 MG/ML IV SOLN
INTRAVENOUS | Status: AC
  Filled 2019-07-07: qty 1

## 2019-07-07 MED ORDER — POLYETHYLENE GLYCOL 3350 17 G PO PACK: 17.0000 g | PACK | Freq: Every day | ORAL | Status: DC | PRN

## 2019-07-07 MED ORDER — BISACODYL 5 MG PO TBEC: 5.0000 mg | DELAYED_RELEASE_TABLET | Freq: Every day | ORAL | Status: DC | PRN

## 2019-07-07 MED ORDER — ALUM & MAG HYDROXIDE-SIMETH 200-200-20 MG/5ML PO SUSP: 30.0000 mL | ORAL | Status: DC | PRN

## 2019-07-07 MED ORDER — ASPIRIN EC 81 MG PO TBEC
81.0000 mg | DELAYED_RELEASE_TABLET | Freq: Two times a day (BID) | ORAL | 0 refills | Status: DC
Start: 2019-07-07 — End: 2020-06-12

## 2019-07-07 MED ORDER — TIZANIDINE HCL 2 MG PO TABS
2.0000 mg | ORAL_TABLET | Freq: Four times a day (QID) | ORAL | 0 refills | Status: DC | PRN
Start: 2019-07-07 — End: 2020-06-12

## 2019-07-07 MED ORDER — ZINC SULFATE 220 (50 ZN) MG PO CAPS
220.0000 mg | ORAL_CAPSULE | Freq: Every day | ORAL | Status: DC
  Administered 2019-07-08: 220 mg via ORAL
  Filled 2019-07-07: qty 1

## 2019-07-07 MED ORDER — STERILE WATER FOR IRRIGATION IR SOLN
Status: DC | PRN
  Administered 2019-07-07: 1000 mL

## 2019-07-07 MED ORDER — METOCLOPRAMIDE HCL 5 MG PO TABS: 5.0000 mg | ORAL_TABLET | Freq: Three times a day (TID) | ORAL | Status: DC | PRN

## 2019-07-07 MED ORDER — BUPIVACAINE IN DEXTROSE 0.75-8.25 % IT SOLN
INTRATHECAL | Status: DC | PRN
  Administered 2019-07-07: 1.8 mL via INTRATHECAL

## 2019-07-07 MED ORDER — CELECOXIB 200 MG PO CAPS
200.0000 mg | ORAL_CAPSULE | Freq: Two times a day (BID) | ORAL | Status: DC
  Administered 2019-07-07 – 2019-07-08 (×2): 200 mg via ORAL
  Filled 2019-07-07 (×2): qty 1

## 2019-07-07 MED ORDER — METHOCARBAMOL 500 MG PO TABS
500.0000 mg | ORAL_TABLET | Freq: Four times a day (QID) | ORAL | Status: DC | PRN
  Administered 2019-07-07 – 2019-07-08 (×3): 500 mg via ORAL
  Filled 2019-07-07 (×3): qty 1

## 2019-07-07 MED ORDER — TRANEXAMIC ACID-NACL 1000-0.7 MG/100ML-% IV SOLN
1000.0000 mg | INTRAVENOUS | Status: AC
  Administered 2019-07-07: 1000 mg via INTRAVENOUS
  Filled 2019-07-07: qty 100

## 2019-07-07 MED ORDER — MIDAZOLAM HCL 2 MG/2ML IJ SOLN
1.0000 mg | Freq: Once | INTRAMUSCULAR | Status: AC
  Administered 2019-07-07: 2 mg via INTRAVENOUS
  Filled 2019-07-07: qty 2

## 2019-07-07 MED ORDER — EZETIMIBE 10 MG PO TABS
10.0000 mg | ORAL_TABLET | Freq: Every day | ORAL | Status: DC
  Administered 2019-07-07 – 2019-07-08 (×2): 10 mg via ORAL
  Filled 2019-07-07 (×2): qty 1

## 2019-07-07 MED ORDER — FLUTICASONE PROPIONATE 50 MCG/ACT NA SUSP: 1.0000 | Freq: Every day | NASAL | Status: DC | PRN

## 2019-07-07 MED ORDER — MENTHOL 3 MG MT LOZG: 1.0000 | LOZENGE | OROMUCOSAL | Status: DC | PRN

## 2019-07-07 MED ORDER — OXYCODONE HCL 5 MG/5ML PO SOLN: 5.0000 mg | Freq: Once | ORAL | Status: DC | PRN

## 2019-07-07 MED ORDER — HYDROMORPHONE HCL 2 MG PO TABS
2.0000 mg | ORAL_TABLET | ORAL | Status: DC | PRN
  Administered 2019-07-07 – 2019-07-08 (×4): 2 mg via ORAL
  Filled 2019-07-07 (×4): qty 1

## 2019-07-07 MED ORDER — SODIUM CHLORIDE 0.9 % IR SOLN
Status: DC | PRN
  Administered 2019-07-07: 3000 mL

## 2019-07-07 MED ORDER — ROSUVASTATIN CALCIUM 20 MG PO TABS: 20.0000 mg | ORAL_TABLET | ORAL | Status: DC

## 2019-07-07 MED ORDER — ACETAMINOPHEN 10 MG/ML IV SOLN: 1000.0000 mg | Freq: Once | INTRAVENOUS | Status: DC | PRN

## 2019-07-07 MED ORDER — HYDROMORPHONE HCL 1 MG/ML IJ SOLN
0.5000 mg | INTRAMUSCULAR | Status: DC | PRN
  Administered 2019-07-07: 1 mg via INTRAVENOUS
  Filled 2019-07-07: qty 1

## 2019-07-07 MED ORDER — SODIUM CHLORIDE (PF) 0.9 % IJ SOLN
INTRAMUSCULAR | Status: DC | PRN
  Administered 2019-07-07: 50 mL

## 2019-07-07 MED ORDER — LACTATED RINGERS IV SOLN: INTRAVENOUS | Status: DC

## 2019-07-07 MED ORDER — CLONIDINE HCL (ANALGESIA) 100 MCG/ML EP SOLN
EPIDURAL | Status: DC | PRN
  Administered 2019-07-07: 100 ug

## 2019-07-07 MED ORDER — HYDROMORPHONE HCL 1 MG/ML IJ SOLN: 0.2500 mg | INTRAMUSCULAR | Status: DC | PRN

## 2019-07-07 SURGICAL SUPPLY — 64 items
BAG DECANTER FOR FLEXI CONT (MISCELLANEOUS) ×2 IMPLANT
BAG ZIPLOCK 12X15 (MISCELLANEOUS) ×2 IMPLANT
BLADE OSCILLATING/SAGITTAL (BLADE) ×2
BLADE SAG 18X100X1.27 (BLADE) ×2 IMPLANT
BLADE SAW SAG 90X13X1.27 (BLADE) ×2 IMPLANT
BLADE SAW SGTL 81X20 HD (BLADE) ×2 IMPLANT
BLADE SURG SZ10 CARB STEEL (BLADE) ×4 IMPLANT
BLADE SW THK.38XMED LNG THN (BLADE) ×2 IMPLANT
BNDG ELASTIC 6X10 VLCR STRL LF (GAUZE/BANDAGES/DRESSINGS) ×2 IMPLANT
BNDG ELASTIC 6X5.8 VLCR STR LF (GAUZE/BANDAGES/DRESSINGS) ×2 IMPLANT
BOWL SMART MIX CTS (DISPOSABLE) ×2 IMPLANT
BRUSH FEMORAL CANAL (MISCELLANEOUS) ×2 IMPLANT
BUR OVAL CARBIDE 4.0 (BURR) ×2 IMPLANT
CEMENT HV SMART SET (Cement) ×6 IMPLANT
COVER SURGICAL LIGHT HANDLE (MISCELLANEOUS) ×2 IMPLANT
COVER WAND RF STERILE (DRAPES) IMPLANT
CUFF TOURN SGL QUICK 34 (TOURNIQUET CUFF) ×1
CUFF TRNQT CYL 34X4.125X (TOURNIQUET CUFF) ×1 IMPLANT
DECANTER SPIKE VIAL GLASS SM (MISCELLANEOUS) ×6 IMPLANT
DRAPE ORTHO SPLIT 77X108 STRL (DRAPES) ×2
DRAPE SURG ORHT 6 SPLT 77X108 (DRAPES) ×2 IMPLANT
DRAPE U-SHAPE 47X51 STRL (DRAPES) ×2 IMPLANT
DRESSING AQUACEL AG SP 3.5X10 (GAUZE/BANDAGES/DRESSINGS) ×1 IMPLANT
DRSG AQUACEL AG ADV 3.5X14 (GAUZE/BANDAGES/DRESSINGS) IMPLANT
DRSG AQUACEL AG SP 3.5X10 (GAUZE/BANDAGES/DRESSINGS) ×2
DURAPREP 26ML APPLICATOR (WOUND CARE) ×2 IMPLANT
ELECT REM PT RETURN 15FT ADLT (MISCELLANEOUS) ×2 IMPLANT
FEM KNEE ATTUNE REV CRS SZ6 RT (Orthopedic Implant) ×2 IMPLANT
FEMORAL KNE ATTN REV CRS SZ6RT (Orthopedic Implant) ×1 IMPLANT
GLOVE BIO SURGEON STRL SZ7.5 (GLOVE) ×2 IMPLANT
GLOVE BIO SURGEON STRL SZ8.5 (GLOVE) ×2 IMPLANT
GLOVE BIOGEL PI IND STRL 8 (GLOVE) ×1 IMPLANT
GLOVE BIOGEL PI IND STRL 9 (GLOVE) ×1 IMPLANT
GLOVE BIOGEL PI INDICATOR 8 (GLOVE) ×1
GLOVE BIOGEL PI INDICATOR 9 (GLOVE) ×1
GOWN STRL REUS W/TWL XL LVL3 (GOWN DISPOSABLE) ×4 IMPLANT
HANDPIECE INTERPULSE COAX TIP (DISPOSABLE) ×1
HOOD PEEL AWAY FLYTE STAYCOOL (MISCELLANEOUS) ×6 IMPLANT
IMMOBILIZER KNEE 20 (SOFTGOODS) ×2
IMMOBILIZER KNEE 20 THIGH 36 (SOFTGOODS) ×1 IMPLANT
INSERT ROTATNG PLT SZ6 6 KNEE (Miscellaneous) ×2 IMPLANT
INSERT TIB CMT ATTUNE RP SZ8 (Knees) ×2 IMPLANT
KIT TURNOVER KIT A (KITS) ×2 IMPLANT
NEEDLE HYPO 21X1.5 SAFETY (NEEDLE) ×4 IMPLANT
NS IRRIG 1000ML POUR BTL (IV SOLUTION) ×2 IMPLANT
PACK TOTAL KNEE CUSTOM (KITS) ×2 IMPLANT
PENCIL SMOKE EVACUATOR (MISCELLANEOUS) ×2 IMPLANT
PIN STEINMAN FIXATION KNEE (PIN) ×2 IMPLANT
PROTECTOR NERVE ULNAR (MISCELLANEOUS) ×2 IMPLANT
SET HNDPC FAN SPRY TIP SCT (DISPOSABLE) ×1 IMPLANT
STAPLER VISISTAT 35W (STAPLE) IMPLANT
STEM KNEE ATTUNE 12X110MM (Stem) ×2 IMPLANT
STEM STR ATTUNE PF 16X160 (Knees) ×2 IMPLANT
SUT VIC AB 1 CTX 36 (SUTURE) ×1
SUT VIC AB 1 CTX36XBRD ANBCTR (SUTURE) ×1 IMPLANT
SUT VIC AB 3-0 CT1 27 (SUTURE) ×1
SUT VIC AB 3-0 CT1 TAPERPNT 27 (SUTURE) ×1 IMPLANT
SWAB COLLECTION DEVICE MRSA (MISCELLANEOUS) IMPLANT
SWAB CULTURE ESWAB REG 1ML (MISCELLANEOUS) IMPLANT
SYR CONTROL 10ML LL (SYRINGE) ×4 IMPLANT
TRAY FOLEY MTR SLVR 16FR STAT (SET/KITS/TRAYS/PACK) ×2 IMPLANT
WATER STERILE IRR 1000ML POUR (IV SOLUTION) ×4 IMPLANT
WRAP KNEE MAXI GEL POST OP (GAUZE/BANDAGES/DRESSINGS) ×2 IMPLANT
YANKAUER SUCT BULB TIP 10FT TU (MISCELLANEOUS) ×2 IMPLANT

## 2019-07-07 NOTE — Interval H&P Note (Signed)
History and Physical Interval Note:  07/07/2019 11:23 AM  Jermaine Stewart  has presented today for surgery, with the diagnosis of LOOSE RIGHT TOTAL KNEE REPLACEMENT.  The various methods of treatment have been discussed with the patient and family. After consideration of risks, benefits and other options for treatment, the patient has consented to  Procedure(s): RIGHT TOTAL KNEE REVISION (Right) as a surgical intervention.  The patient's history has been reviewed, patient examined, no change in status, stable for surgery.  I have reviewed the patient's chart and labs.  Questions were answered to the patient's satisfaction.     Nestor Lewandowsky

## 2019-07-07 NOTE — Anesthesia Postprocedure Evaluation (Signed)
Anesthesia Post Note  Patient: Jermaine Stewart  Procedure(s) Performed: RIGHT TOTAL KNEE REVISION (Right Knee)     Patient location during evaluation: Nursing Unit Anesthesia Type: Regional and Spinal Level of consciousness: oriented and awake and alert Pain management: pain level controlled Vital Signs Assessment: post-procedure vital signs reviewed and stable Respiratory status: spontaneous breathing and respiratory function stable Cardiovascular status: blood pressure returned to baseline and stable Postop Assessment: no headache, no backache, no apparent nausea or vomiting and patient able to bend at knees Anesthetic complications: no    Last Vitals:  Vitals:   07/07/19 1822 07/07/19 1915  BP: (!) 136/92 129/81  Pulse: 73 70  Resp: 17 16  Temp: 36.8 C 36.8 C  SpO2: 97% 98%    Last Pain:  Vitals:   07/07/19 1915  TempSrc: Oral  PainSc:                  Trevor Iha

## 2019-07-07 NOTE — Discharge Instructions (Signed)

## 2019-07-07 NOTE — Transfer of Care (Signed)
Immediate Anesthesia Transfer of Care Note  Patient: Jermaine Stewart  Procedure(s) Performed: Procedure(s): RIGHT TOTAL KNEE REVISION (Right)  Patient Location: PACU  Anesthesia Type:Spinal  Level of Consciousness: awake, alert  and oriented  Airway & Oxygen Therapy: Patient Spontanous Breathing  Post-op Assessment: Report given to RN and Post -op Vital signs reviewed and stable  Post vital signs: Reviewed and stable  Last Vitals:  Vitals:   07/07/19 1128 07/07/19 1130  BP: 121/75 125/74  Pulse:  65  Resp:  17  Temp:    SpO2:  98%    Complications: No apparent anesthesia complications

## 2019-07-07 NOTE — Op Note (Signed)
PATIENT ID:      Jermaine Stewart  MRN:     [id number] DOB/AGE:    1960-03-27 / 59 y.o.       OPERATIVE REPORT   DATE OF PROCEDURE:  07/07/2019      PREOPERATIVE DIAGNOSIS:   LOOSE RIGHT TOTAL KNEE REPLACEMENT      Estimated body mass index is 23.96 kg/m as calculated from the following:   Height as of 06/25/19: 5\' 10"  (1.778 m).   Weight as of this encounter: 75.8 kg.                                                       POSTOPERATIVE DIAGNOSIS:   LOOSE RIGHT TOTAL KNEE REPLACEMENT                                                                       PROCEDURE: Revision right total knee arthroplasty with removal of loose Depuy attune femoral and tibial components cemented and revision to attune revision 6 right femur with a 160 mm x 16 mm stem with distal cement, #8 revision tibia with a 12 mm x 110 mm revision stem.  The patellar implant was intact.  We placed a 6 mm high post RP revision bearing.    SURGEON:  ASSISTANT:   Nestor Lewandowsky. Tomi Likens   (Present and scrubbed throughout the case, critical for assistance with exposure, retraction, instrumentation, and closure.)        ANESTHESIA: Spinal, 20cc Exparel, 50cc 0.25% Marcaine EBL: 400 cc FLUID REPLACEMENT: 1800 cc crystaloid TOURNIQUET: Not used DRAINS: None TRANEXAMIC ACID: 1gm IV, 2gm topical COMPLICATIONS:  None         INDICATIONS FOR PROCEDURE: Patient underwent primary DePuy attune cemented total knee arthroplasty in 2015 did well until approximately 3 months ago when he had increasing pain and noticed recurrent varus deformity.  Plain radiographs show delamination of the femoral component from the femoral cement with a component angulated apex anterior about 15 degrees.  He had also subsided into varus.  The patellar implant looked to be in good condition there was some minor osteolysis around the tibial implant.  Because of increasing pain and decreasing function revision surgery was discussed with the patient and  consented to.  Preoperative work-up included normal CRP and sed rate normal fluid analysis.  Low probability for infection.  Risks and benefits of surgery have been discussed, questions answered.   DESCRIPTION OF PROCEDURE: The patient identified by armband, received  IV antibiotics, in the holding area at [facility]. Patient taken to the operating room, appropriate anesthetic monitors were attached, and spinal anesthesia was  induced. IV Tranexamic acid was given.Tourniquet applied high to the operative thigh. Lateral post and foot positioner applied to the table, the lower extremity was then prepped and draped in usual sterile fashion from the toes to the tourniquet. Time-out procedure was performed. The skin and subcutaneous tissue along the incision was injected with 20 cc of a mixture of Exparel and Marcaine solution, using a 20-gauge by 1-1/2 inch needle. We  began the operation, with the knee flexed 130 degrees, by making the anterior midline incision starting at handbreadth above the patella going over the patella 1 cm medial to and 4 cm distal to the tibial tubercle. Small bleeders in the skin and the subcutaneous tissue identified and cauterized. Transverse retinaculum was incised and reflected medially and a medial parapatellar arthrotomy was accomplished.  The joint fluid was clear with no evidence of infection and the synovium appeared to be benign. Lloose bits of cement were noticed in the synovium and a fairly complete synovectomy was accomplished.  We elevated the superficial medial collateral ligament off of the proximal tibia and subluxed the patella laterally allowing [place] to increase flexion and continue to remove scar tissue as well as complete the synovectomy.  The femoral component was not grossly loose and with hyperflexion with the patellar component laterally subluxed it was removed by hand.  We continue to remove peripatellar scar tissue and were able to evert the patella and  hyperflexed the knee.  A 1/2 inch osteotome was placed along the medial edge of the tibial implant and it came off with 2 medium blows with a mallet delaminating off the cement.  We then instrumented the tibia with a starter drill and removed the cone of cement around the starter drill with the [place] instruments.  We then sequentially reamed up to a 12 mm reamer to the appropriate depth or 110 mm stem and then reamed to partial depth with a 13 mm reamer.  With a 12 mm reamer in place we performed a proximal tibial cut removing about 1 to 2 mm of cement and a little bit of bone.  At this point the 12 mm reamer was removed we sized for a #8 tibia and pinned the trial baseplate in place.  Followed by the conical reamer with 10 and 12 mm trial stems in place.  We then assembled a trial 8 baseplate with a 12 mm x 110 mm stem inserted in the proximal tibia in the correct rotation and then used the delta fin keel punch.  We then directed our attention to the patella removed scar tissue from around the edge of the implant and found to be stable.  Scar tissue was removed from the distal femur we instrumented it with a starter drill and then reamed up to 16 mm to the appropriate depth for a 160 mm stem partially reamed to 17 mm about one half depth.  With a 16 mm reamer in place we assembled the distal femoral cutting guide and performed a 1 to 2 mm distal cut on the lateral femoral condyle and medial femoral condyle.  We then assembled a cut through trial with a 6 right femur and a 16 mm x 160 mm trial stem.  This was inserted into the femur after using the boss reamer to open up the distal femur.  The trial fit nicely flush with the distal femur.  We then performed trial reduction with a 6 mm high post trial bearing the knee came to full extension good ligamentous stability.  Were able to flex to 120 degrees with no liftoff.  At this point the trial implants were removed and we removed more bits of cement and fibrous  tissue from the bony interface.  Bony surfaces were then [facility] clean and dry with suction and sponges including the canals.  At the back table we assembled the tibial implant which was an 8 attune revision baseplate with a 12 mm  x 110 mm stem to the appropriate torque on the femoral side a 6 right attune revision femur with a 110 mm x 16 mm stem to the appropriate torque.  A triple batch of DePuy HV cement was then mixed and applied to the mating surfaces of the distal femoral implant and proximal tibial implant as well as the bony surfaces of the distal femur and proximal tibia.  We then hammered into place the tibial implant in the correct rotation and removed excess cement followed by the femoral implant in the correct rotation and again removed excess cement.  The 6 mm bearing was inserted and the knee held in 30 degrees flexion with compression as the cement cured.  Exparel was infiltrated in all the soft tissues.  The wound was thoroughly irrigated out with normal saline solution.  Small bleeders were identified and cauterized. The parapatellar arthrotomy was closed with running #1 Vicryl suture. The subcutaneous tissue with 0 and 3-0 undyed Vicryl suture, and the skin with running 3-0 SQ vicryl. An Aquacil and Ace wrap were applied. The patient was taken to recovery room without difficulty.   Kerin Salen 07/07/2019, 2:15 PM

## 2019-07-07 NOTE — Progress Notes (Signed)
Orthopedic Tech Progress Note Patient Details:  Jermaine Stewart July 25, 1960 [id number]  Ortho Devices Type of Ortho Device: Bone foam zero knee Ortho Device/Splint Interventions: Ordered   Post Interventions Instructions Provided: Care of device   Ancil Linsey 07/07/2019, 3:32 PM

## 2019-07-07 NOTE — Anesthesia Procedure Notes (Signed)
Anesthesia Regional Block: Adductor canal block   Pre-Anesthetic Checklist: ,, timeout performed, Correct Patient, Correct Site, Correct Laterality, Correct Procedure, Correct Position, site marked, Risks and benefits discussed,  Surgical consent,  Pre-op evaluation,  At surgeon's request and post-op pain management  Laterality: Lower and Right  Prep: chloraprep       Needles:  Injection technique: Single-shot  Needle Type: Echogenic Needle     Needle Length: 9cm  Needle Gauge: 22     Additional Needles:   Procedures:,,,, ultrasound used (permanent image in chart),,,,  Narrative:  Start time: 07/07/2019 11:17 AM End time: 07/07/2019 11:22 AM Injection made incrementally with aspirations every 5 mL.  Performed by: Personally  Anesthesiologist: Trevor Iha, MD  Additional Notes: Block assessed prior to surgery. Pt tolerated procedure well.

## 2019-07-07 NOTE — Anesthesia Procedure Notes (Signed)
Spinal  Patient location during procedure: OR Start time: 07/07/2019 11:46 AM Staffing Performed: resident/CRNA  Anesthesiologist: Barnet Glasgow, MD Resident/CRNA: Gerald Leitz, CRNA Preanesthetic Checklist Completed: patient identified, IV checked, site marked, risks and benefits discussed, surgical consent, monitors and equipment checked, pre-op evaluation and timeout performed Spinal Block Patient position: sitting Prep: DuraPrep Patient monitoring: heart rate, continuous pulse ox, blood pressure and cardiac monitor Approach: midline Location: L3-4 Injection technique: single-shot Needle Needle type: Introducer and Pencan  Needle gauge: 24 G Needle length: 9 cm Assessment Sensory level: T4 Additional Notes Negative paresthesia. Negative blood return. Positive free-flowing CSF. Expiration date of kit checked and confirmed. Patient tolerated procedure well, without complications.

## 2019-07-07 NOTE — Progress Notes (Signed)
AssistedDr. Houser with right, ultrasound guided, adductor canal block. Side rails up, monitors on throughout procedure. See vital signs in flow sheet. Tolerated Procedure well.  

## 2019-07-08 ENCOUNTER — Other Ambulatory Visit: Payer: Self-pay

## 2019-07-08 ENCOUNTER — Encounter: Payer: Self-pay | Admitting: *Deleted

## 2019-07-08 LAB — CBC
HCT: 34.6 % — ABNORMAL LOW (ref 39.0–52.0)
Hemoglobin: 11.6 g/dL — ABNORMAL LOW (ref 13.0–17.0)
MCH: 32 pg (ref 26.0–34.0)
MCHC: 33.5 g/dL (ref 30.0–36.0)
MCV: 95.6 fL (ref 80.0–100.0)
Platelets: 205 10*3/uL (ref 150–400)
RBC: 3.62 MIL/uL — ABNORMAL LOW (ref 4.22–5.81)
RDW: 11.8 % (ref 11.5–15.5)
WBC: 9.4 10*3/uL (ref 4.0–10.5)
nRBC: 0 % (ref 0.0–0.2)

## 2019-07-08 LAB — BASIC METABOLIC PANEL
Anion gap: 6 (ref 5–15)
BUN: 10 mg/dL (ref 6–20)
CO2: 29 mmol/L (ref 22–32)
Calcium: 8.4 mg/dL — ABNORMAL LOW (ref 8.9–10.3)
Chloride: 100 mmol/L (ref 98–111)
Creatinine, Ser: 0.84 mg/dL (ref 0.61–1.24)
GFR calc Af Amer: >60 mL/min (ref >60)
GFR calc non Af Amer: >60 mL/min (ref >60)
Glucose, Bld: 213 mg/dL — ABNORMAL HIGH (ref 70–99)
Potassium: 4 mmol/L (ref 3.5–5.1)
Sodium: 135 mmol/L (ref 135–145)

## 2019-07-08 NOTE — TOC Initial Note (Signed)
Transition of Care [facility]) - Initial/Assessment Note    Patient Details  Name: Jermaine Stewart MRN: [id number] Date of Birth: 1960/03/15  Transition of [facility]) CM/SW Contact:    Armanda Heritage, RN Phone Number: 07/08/2019, 9:33 AM  Clinical Narrative:    CM spoke with patient at bedside.  Patient reports he has rolling walker and 3in1 at home.  [facility]                 Expected Discharge Plan: Home w Home Health Services Barriers to Discharge: Continued Medical Work up   Patient Goals and CMS Choice Patient states their goals for this hospitalization and ongoing recovery are:: to go home with therapy CMS Medicare.gov Compare Post Acute Care list provided to:: Patient Choice offered to / list presented to : Patient  Expected Discharge Plan and Services Expected Discharge Plan: Home w Home Health Services   Discharge Planning Services: CM Consult Post Acute Care Choice: Home Health Living arrangements for the past 2 months: Single Family Home                 DME Arranged: N/A DME Agency: NA       HH Arranged: PT HH [facility]: [facility] [employer] [facility] Date HH Agency Contacted: 07/08/19 Time HH Agency Contacted: [phone] Representative spoke with at [facility] Agency: pre-arranged in MD office  Prior Living Arrangements/Services Living arrangements for the past 2 months: Single Family Home   Patient language and need for interpreter reviewed:: Yes Do you feel safe going back to the place where you live?: Yes      Need for Family Participation in Patient Care: Yes (Comment) Care giver support system in place?: Yes (comment)   Criminal Activity/Legal Involvement Pertinent to Current Situation/Hospitalization: No - Comment as needed  Activities of Daily Living Home Assistive Devices/Equipment: Bedside commode/3-in-1, Walker (specify type) ADL Screening (condition at time of admission) Patient's cognitive ability adequate to  safely complete daily activities?: Yes Is the patient deaf or have difficulty hearing?: No Does the patient have difficulty seeing, even when wearing glasses/contacts?: No Does the patient have difficulty concentrating, remembering, or making decisions?: No Patient able to express need for assistance with ADLs?: Yes Does the patient have difficulty dressing or bathing?: No Independently performs ADLs?: Yes (appropriate for developmental age) Does the patient have difficulty walking or climbing stairs?: Yes Weakness of Legs: Right Weakness of Arms/Hands: None  Permission Sought/Granted                  Emotional Assessment   Attitude/Demeanor/Rapport: Engaged Affect (typically observed): Accepting Orientation: : Oriented to Self, Oriented to Place, Oriented to  Time, Oriented to Situation   Psych Involvement: No (comment)  Admission diagnosis:  S/P revision of total knee, right [Z96.651] Patient Active Problem List   Diagnosis Date Noted  . S/P revision of total knee, right 07/07/2019  . Painful total knee replacement, right (HCC) 07/04/2019  . 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 Loose left total knee arthroplasty (HCC) 09/26/2018  . Acute on chronic anemia 04/23/2018  . GERD (gastroesophageal reflux disease) 04/19/2018  . Radiculopathy 03/28/2018  . Spinal stenosis, lumbar region, with neurogenic claudication 10/16/2017  . BMI 25.0-25.9,adult 10/12/2017  . Osteoarthritis of right knee 03/04/2014  . Other abnormal glucose 07/22/2013  . Medication management 07/22/2013  . Essential hypertension 03/12/2013  . Vitamin D deficiency   . Hyperlipidemia  PCP:  Unk Pinto, MD Pharmacy:   [facility] [address], [place] AT [facility] [place] [address] Phone: [phone] Fax: [phone]  [facility] [place], [place] [address] [place] [address] Phone: [phone] Fax: [phone]     Social Determinants of Health (SDOH) Interventions    Readmission Risk Interventions No flowsheet data found.

## 2019-07-08 NOTE — Progress Notes (Signed)
RN reviewed discharge instructions with patient and family.   All questions answered.   Paperwork and prescriptions given.     SWhittemore, Charity fundraiser

## 2019-07-08 NOTE — Discharge Summary (Signed)
Patient ID: Jermaine Stewart MRN: [id number] DOB/AGE: 1960-06-06 59 y.o.  Admit date: 07/07/2019 Discharge date: 07/08/2019  Admission Diagnoses:  Principal Problem:   Painful total knee replacement, right (HCC) Active Problems:   S/P revision of total knee, right   Discharge Diagnoses:  Same  Past Medical History:  Diagnosis Date  . Arthritis   . Depression   . GERD (gastroesophageal reflux disease)   . Hyperlipidemia   . Hypertension     Surgeries: Procedure(s): RIGHT TOTAL KNEE REVISION on 07/07/2019   Consultants:   Discharged Condition: Improved  Hospital Course: Jermaine Stewart is an 59 y.o. male who was admitted 07/07/2019 for operative treatment ofPainful total knee replacement, right (HCC). Patient has severe unremitting pain that affects sleep, daily activities, and work/hobbies. After pre-op clearance the patient was taken to the operating room on 07/07/2019 and underwent  Procedure(s): RIGHT TOTAL KNEE REVISION.    Patient was given perioperative antibiotics:  Anti-infectives (From admission, onward)   Start     Dose/Rate Route Frequency Ordered Stop   07/07/19 1015  ceFAZolin (ANCEF) IVPB 2g/100 mL premix     2 g 200 mL/hr over 30 Minutes Intravenous On call to O.R. 07/07/19 1003 07/07/19 1139       Patient was given sequential compression devices, early ambulation, and chemoprophylaxis to prevent DVT.  Patient benefited maximally from hospital stay and there were no complications.    Recent vital signs:  Patient Vitals for the past 24 hrs:  BP Temp Temp src Pulse Resp SpO2 Height Weight  07/08/19 0958 134/90 97.6 F (36.4 C) -- (!) 55 16 96 % -- --  07/08/19 0529 135/82 97.6 F (36.4 C) Oral (!) 52 16 100 % 5\' 10"  (1.778 m) 74.8 kg  07/08/19 0220 (!) 145/82 (!) 97.5 F (36.4 C) Oral (!) 52 16 100 % -- --  07/07/19 2200 137/81 98.2 F (36.8 C) Oral 68 16 100 % -- --  07/07/19 1915 129/81 98.2 F (36.8 C) Oral 70 16 98 % -- --  07/07/19 1822 (!)  136/92 98.3 F (36.8 C) Oral 73 17 97 % -- --  07/07/19 1713 (!) 134/92 98.1 F (36.7 C) -- 73 18 100 % -- --  07/07/19 1557 121/80 -- -- 73 16 100 % -- --  07/07/19 1530 109/72 -- -- 80 14 100 % -- --  07/07/19 1500 (!) 88/73 -- -- 76 16 100 % -- --  07/07/19 1446 106/88 98.3 F (36.8 C) -- 76 14 100 % -- --     Recent laboratory studies:  Recent Labs    07/08/19 0326  WBC 9.4  HGB 11.6*  HCT 34.6*  PLT 205  NA 135  K 4.0  CL 100  CO2 29  BUN 10  CREATININE 0.84  GLUCOSE 213*  CALCIUM 8.4*     Discharge Medications:   Allergies as of 07/08/2019      Reactions   Atorvastatin Other (See Comments)   Arthralgias   Percocet [oxycodone-acetaminophen] Other (See Comments)   Hallucinates      Medication List    STOP taking these medications   meloxicam 15 MG tablet Commonly known as: MOBIC     TAKE these medications   aspirin EC 81 MG tablet Take 1 tablet (81 mg total) by mouth 2 (two) times daily.   atenolol 100 MG tablet Commonly known as: Tenormin Take 1 tablet at Bedtime for BP What changed:   how much to take  how to  take this  when to take this   buPROPion 150 MG 24 hr tablet Commonly known as: WELLBUTRIN XL Take 1 tablet every Morning for Mood, Focus & Concentration What changed:   how much to take  how to take this  when to take this   ezetimibe 10 MG tablet Commonly known as: ZETIA Take 1 tablet Daily for Cholestrol What changed:   how much to take  how to take this  when to take this   fenofibrate 145 MG tablet Commonly known as: TRICOR TAKE 1 TABLET DAILY FOR BLOOD FATS What changed: See the new instructions.   fluticasone 50 MCG/ACT nasal spray Commonly known as: FLONASE Use 1 to Sprays each nares 1 to 2 x /day What changed:   how much to take  how to take this  when to take this  reasons to take this  additional instructions   HYDROmorphone 2 MG tablet Commonly known as: Dilaudid Take 1 tablet (2 mg total)  by mouth every 4 (four) hours as needed for severe pain.   omeprazole 40 MG capsule Commonly known as: PRILOSEC Take 1 tablet 2 x /day for Acid Indigestion & Reflux   rosuvastatin 20 MG tablet Commonly known as: CRESTOR Take 1 tablet 2 x  /week for Cholesterol. What changed:   how much to take  how to take this  when to take this  additional instructions   tiZANidine 2 MG tablet Commonly known as: ZANAFLEX Take 1 tablet (2 mg total) by mouth every 6 (six) hours as needed. What changed:   how much to take  how to take this  when to take this  reasons to take this  additional instructions Notes to patient: Muscle relaxer    Vitamin D-3 125 MCG (5000 UT) Tabs Take 15,000 Units by mouth daily.   zinc gluconate 50 MG tablet Take 50 mg by mouth daily.            Durable Medical Equipment  (From admission, onward)         Start     Ordered   07/07/19 1602  DME Walker rolling  Once    Question:  Patient needs a walker to treat with the following condition  Answer:  Status post right knee replacement   07/07/19 1601   07/07/19 1602  DME 3 n 1  Once     07/07/19 1601           Discharge Care Instructions  (From admission, onward)         Start     Ordered   07/08/19 0000  Weight bearing as tolerated     07/08/19 1251          Diagnostic Studies: DG Chest 2 View  Result Date: 06/26/2019 CLINICAL DATA:  Preop for knee surgery. EXAM: CHEST - 2 VIEW COMPARISON:  09/25/2018. FINDINGS: The heart size and mediastinal contours are within normal limits. Both lungs are clear. The visualized skeletal structures are unremarkable. IMPRESSION: No active cardiopulmonary disease.  Exam stable from prior study. Electronically Signed   By: Maisie Fus  Register   On: 06/26/2019 05:54    Disposition: Discharge disposition: 01-Home or Self Care       Discharge Instructions    Call MD / Call 911   Complete by: As directed    If you experience chest pain or  shortness of breath, CALL 911 and be transported to the hospital emergency room.  If you develope a fever above 101  F, pus (white drainage) or increased drainage or redness at the wound, or calf pain, call your surgeon's office.   Constipation Prevention   Complete by: As directed    Drink plenty of fluids.  Prune juice may be helpful.  You may use a stool softener, such as Colace (over the counter) 100 mg twice a day.  Use MiraLax (over the counter) for constipation as needed.   Diet - low sodium heart healthy   Complete by: As directed    Driving restrictions   Complete by: As directed    No driving for 2 weeks   Increase activity slowly as tolerated   Complete by: As directed    Patient may shower   Complete by: As directed    You may shower without a dressing once there is no drainage.  Do not wash over the wound.  If drainage remains, cover wound with plastic wrap and then shower.   Weight bearing as tolerated   Complete by: As directed       Follow-up Information    Frederik Pear, MD In 2 weeks.   Specialty: Orthopedic Surgery Contact information: [place] [phone]        [facility] Follow up.   Specialty: [place] Why: agency will provide home health physical therapy Contact information: [place]  [phone]            Signed: Joanell Rising 07/08/2019, 12:51 PM

## 2019-07-08 NOTE — Progress Notes (Signed)
PATIENT ID: Jermaine Stewart  MRN: [id number]  DOB/AGE:  [birth year] / 59 y.o.  1 Day Post-Op Procedure(s) (LRB): RIGHT TOTAL KNEE REVISION (Right)    PROGRESS NOTE Subjective: Patient is alert, oriented, no Nausea, no Vomiting, yes passing gas. Taking PO well. Denies SOB, Chest or Calf Pain. Using Incentive Spirometer, PAS in place. Ambulate WBAT, Patient reports pain as 2/10 .    Objective: Vital signs in last 24 hours: Vitals:   07/07/19 1915 07/07/19 2200 07/08/19 0220 07/08/19 0529  BP: 129/81 137/81 (Abnormal) 145/82 135/82  Pulse: 70 68 (Abnormal) 52 (Abnormal) 52  Resp: 16 16 16 16   Temp: 98.2 F (36.8 C) 98.2 F (36.8 C) (Abnormal) 97.5 F (36.4 C) 97.6 F (36.4 C)  TempSrc: Oral Oral Oral Oral  SpO2: 98% 100% 100% 100%  Weight:          Intake/Output from previous day: I/O last 3 completed shifts: In: 3280 [P.O.:480; I.V.:2600; IV Piggyback:200] Out: 1095 [Urine:945; Blood:150]   Intake/Output this shift: No intake/output data recorded.   LABORATORY DATA: Recent Labs    07/08/19 0326  WBC 9.4  HGB 11.6*  HCT 34.6*  PLT 205  NA 135  K 4.0  CL 100  CO2 29  BUN 10  CREATININE 0.84  GLUCOSE 213*  CALCIUM 8.4*    Examination: Neurologically intact ABD soft Neurovascular intact Sensation intact distally Intact pulses distally Dorsiflexion/Plantar flexion intact Incision: dressing C/D/I No cellulitis present Compartment soft}  Assessment:   1 Day Post-Op Procedure(s) (LRB): RIGHT TOTAL KNEE REVISION (Right) ADDITIONAL DIAGNOSIS: Expected Acute Blood Loss Anemia,  Anticipated LOS equal to or greater than 2 midnights due to - Age 57 and older with one or more of the following:  - Obesity  - Expected need for hospital services (PT, OT, Nursing) required for safe  discharge  - Anticipated need for postoperative skilled nursing care or inpatient rehab  Complete Revision TKA Plan: PT/OT WBAT, AROM and PROM  DVT Prophylaxis:  SCDx72hrs, ASA 81 mg  BID x 2 weeks DISCHARGE PLAN: Home, when passes PT DISCHARGE NEEDS: HHPT, Walker and 3-in-1 comode seat     76 07/08/2019, 8:07 AM Patient ID: 07/10/2019, male   DOB: 07/02/1960, 59 y.o.   MRN: [id number]

## 2019-07-08 NOTE — Progress Notes (Addendum)
Physical Therapy Treatment Patient Details Name: Jermaine Stewart MRN: [id number] DOB: June 03, 1960 Today's Date: 07/08/2019    History of Present Illness 59 YO male S/P revision Right TKA, radiculapathy with lumbar fusion, anemia, HTN,HLD.    PT Comments    The  Patient  Has ambulated and practiced steps.Ready for Dc. No further questions about  Exercises. Aware to go slow on knee flexion per MD.  Follow Up Recommendations  Home health PT;Follow surgeon's recommendation for DC plan and follow-up therapies     Equipment Recommendations  None recommended by PT    Recommendations for Other Services       Precautions / Restrictions Precautions Precautions: Fall;Knee    Mobility  Bed Mobility Overal bed mobility: Independent             General bed mobility comments: in recliner  Transfers Overall transfer level: Needs assistance Equipment used: Rolling walker (2 wheeled) Transfers: Sit to/from Stand Sit to Stand: Modified independent (Device/Increase time)         General transfer comment: no assistance  Ambulation/Gait Ambulation/Gait assistance: Supervision Gait Distance (Feet): 100 Feet Assistive device: Rolling walker (2 wheeled) Gait Pattern/deviations: Step-through pattern   Gait velocity interpretation: <1.31 ft/sec, indicative of household ambulator     Stairs Stairs: Yes Stairs assistance: Supervision Stair Management: Two rails;Forwards Number of Stairs: 3     Wheelchair Mobility    Modified Rankin (Stroke Patients Only)       Balance                                            Cognition Arousal/Alertness: Awake/alert Behavior During Therapy: WFL for tasks assessed/performed Overall Cognitive Status: Within Functional Limits for tasks assessed                                        Exercises   General Comments        Pertinent Vitals/Pain Pain Assessment: 0-10 Pain Score: 1  Pain  Location: rt knee Pain Descriptors / Indicators: Discomfort Pain Intervention(s): Monitored during session    Home Living Family/patient expects to be discharged to:: Private residence Living Arrangements: Spouse/significant other;Children Available Help at Discharge: Family Type of Home: House Home Access: Stairs to enter   Home Layout: One level Home Equipment: Environmental consultant - 2 wheels;Bedside commode;Cane - single point      Prior Function Level of Independence: Independent          PT Goals (current goals can now be found in the care plan section) Acute Rehab PT Goals Patient Stated Goal: go home PT Goal Formulation: With patient Time For Goal Achievement: 07/15/19 Potential to Achieve Goals: Good Progress towards PT goals: Progressing toward goals    Frequency    7X/week      PT Plan Current plan remains appropriate    Co-evaluation              AM-PAC PT "6 Clicks" Mobility   Outcome Measure  Help needed turning from your back to your side while in a flat bed without using bedrails?: None Help needed moving from lying on your back to sitting on the side of a flat bed without using bedrails?: None Help needed moving to and from a bed to a chair (including a wheelchair)?: None  Help needed standing up from a chair using your arms (e.g., wheelchair or bedside chair)?: None Help needed to walk in hospital room?: None Help needed climbing 3-5 steps with a railing? : A Little 6 Click Score: 23    End of Session Equipment Utilized During Treatment: Gait belt Activity Tolerance: Patient tolerated treatment well Patient left: in chair;with call bell/phone within reach;with family/visitor present Nurse Communication: Mobility status PT Visit Diagnosis: Unsteadiness on feet (R26.81);Pain Pain - Right/Left: Right Pain - part of body: Knee     Time: 1211-1220 PT Time Calculation (min) (ACUTE ONLY): 9 min  Charges:  $Gait Training: 8-22 mins                      [place] Pager [phone] Office [phone]    Claretha Cooper 07/08/2019, 12:43 PM

## 2019-07-08 NOTE — Evaluation (Signed)
Physical Therapy Evaluation Patient Details Name: Jermaine Stewart MRN: [id number] DOB: [birth year] Today's Date: 07/08/2019   History of Present Illness  59 YO male S/P revision Right TKA, radiculapathy with lumbar fusion, anemia, HTN,HLD.  Clinical Impression  The patient tolerated ambulation and exercises well. [name]    Follow Up Recommendations Home health PT;Follow surgeon's recommendation for DC plan and follow-up therapies    Equipment Recommendations  None recommended by PT    Recommendations for Other Services       Precautions / Restrictions Precautions Precautions: Fall;Knee      Mobility  Bed Mobility Overal bed mobility: Independent                Transfers Overall transfer level: Needs assistance Equipment used: Rolling walker (2 wheeled) Transfers: Sit to/from Stand Sit to Stand: Supervision         General transfer comment: no assistance  Ambulation/Gait Ambulation/Gait assistance: Supervision Gait Distance (Feet): 200 Feet Assistive device: Rolling walker (2 wheeled) Gait Pattern/deviations: Step-through pattern        Stairs            Wheelchair Mobility    Modified Rankin (Stroke Patients Only)       Balance                                             Pertinent Vitals/Pain Pain Assessment: 0-10 Pain Score: 4  Pain Descriptors / Indicators: Discomfort Pain Intervention(s): Ice applied;Monitored during session;Premedicated before session    Home Living Family/patient expects to be discharged to:: Private residence Living Arrangements: Spouse/significant other;Children Available Help at Discharge: Family Type of Home: House Home Access: Stairs to enter   Secretary/administrator of Steps: 2 Home Layout: One level Home Equipment: Environmental consultant - 2 wheels;Bedside commode;Cane - single point      Prior Function Level of Independence: Independent                Hand Dominance        Extremity/Trunk Assessment   Upper Extremity Assessment Upper Extremity Assessment: Overall WFL for tasks assessed    Lower Extremity Assessment Lower Extremity Assessment: RLE deficits/detail RLE Deficits / Details: Positive SLR,knee flexion 60-MD sys to limit flexion currently    Cervical / Trunk Assessment Cervical / Trunk Assessment: Normal  Communication      Cognition Arousal/Alertness: Awake/alert Behavior During Therapy: WFL for tasks assessed/performed Overall Cognitive Status: Within Functional Limits for tasks assessed                                        General Comments      Exercises Total Joint Exercises Quad Sets: AROM;Right;10 reps Hip ABduction/ADduction: AROM;Right;10 reps Straight Leg Raises: AROM;Right;10 reps Knee Flexion: AROM;Right;10 reps   Assessment/Plan    PT Assessment Patient needs continued PT services  PT Problem List Decreased strength;Decreased mobility;Decreased range of motion;Decreased activity tolerance;Decreased knowledge of use of DME;Pain       PT Treatment Interventions DME instruction;Therapeutic activities;Gait training;Therapeutic exercise;Patient/family education;Stair training;Functional mobility training    PT Goals (Current goals can be found in the Care Plan section)  Acute Rehab PT Goals Patient Stated Goal: go home PT Goal Formulation: With patient Time For Goal Achievement: 07/15/19 Potential to Achieve  Goals: Good    Frequency 7X/week   Barriers to discharge        Co-evaluation               AM-PAC PT "6 Clicks" Mobility  Outcome Measure Help needed turning from your back to your side while in a flat bed without using bedrails?: None Help needed moving from lying on your back to sitting on the side of a flat bed without using bedrails?: None Help needed moving to and from a bed to a chair (including a wheelchair)?: None Help needed standing up  from a chair using your arms (e.g., wheelchair or bedside chair)?: None Help needed to walk in hospital room?: A Little Help needed climbing 3-5 steps with a railing? : A Little 6 Click Score: 22    End of Session Equipment Utilized During Treatment: Gait belt Activity Tolerance: Patient tolerated treatment well Patient left: in chair;with call bell/phone within reach;with family/visitor present Nurse Communication: Mobility status PT Visit Diagnosis: Unsteadiness on feet (R26.81);Pain Pain - Right/Left: Right Pain - part of body: Knee    Time: 2993-7169 PT Time Calculation (min) (ACUTE ONLY): 23 min   Charges:   PT Evaluation $PT Eval Low Complexity: 1 Low PT Treatments $Gait Training: 8-22 mins        Tresa Endo PT Acute Rehabilitation Services Pager [phone] Office [phone]   Claretha Cooper 07/08/2019, 12:32 PM

## 2019-08-18 ENCOUNTER — Encounter: Payer: Self-pay | Admitting: Internal Medicine

## 2019-08-18 NOTE — Progress Notes (Signed)
Annual  Screening/Preventative Visit  & Comprehensive Evaluation & Examination     This very nice 59 y.o.  MWM presents for a Screening /Preventative Visit & comprehensive evaluation and management of multiple medical co-morbidities.  Patient has been followed for HTN, HLD, Prediabetes and Vitamin D Deficiency.     About 5 weeks ago, patient had a Rt total Knee Revision  (from orig surg  2016 by Dr Mayer Camel). ( Patient has now undergone bilat TKR and bilateral revisions).      HTN predates since 2000. Patient's BP has been controlled at home.  Today's BP: (!) 144/84. Patient denies any cardiac symptoms as chest pain, palpitations, shortness of breath, dizziness or ankle swelling.     Patient's hyperlipidemia is controlled with diet and Rosuvastatin Venia Minks /Ezetimibe. Patient denies myalgias or other medication SE's. Last lipids were at goal except elevated Trig's:  Lab Results  Component Value Date   CHOL 194 02/04/2019   HDL 68 02/04/2019   LDLCALC 98 02/04/2019   TRIG 182 (H) 02/04/2019   CHOLHDL 2.9 02/04/2019       Patient  Is followed expectantly for Glucose intolerance and patient denies reactive hypoglycemic symptoms, visual blurring, diabetic polys or paresthesias. Last A1c was Normal & at goal:  Lab Results  Component Value Date   HGBA1C 5.1 02/04/2019        Finally, patient has history of Vitamin D Deficiency ("25" / 2014)  and last vitamin D was at goal:  Lab Results  Component Value Date   VD25OH 81 02/04/2019    Current Outpatient Medications on File Prior to Visit  Medication Sig   atenolol (TENORMIN) 100 MG tablet Take 1 tablet at Bedtime for BP (Patient taking differently: Take 100 mg by mouth at bedtime. Take 1 tablet at Bedtime for BP)   buPROPion (WELLBUTRIN XL) 150 MG 24 hr tablet Take 1 tablet every Morning for Mood, Focus & Concentration (Patient taking differently: Take 150 mg by mouth daily. Take 1 tablet every Morning for Mood, Focus &  Concentration)   Cholecalciferol (VITAMIN D-3) 5000 UNITS TABS Take 15,000 Units by mouth daily.    ezetimibe (ZETIA) 10 MG tablet Take 1 tablet Daily for Cholestrol (Patient taking differently: Take 10 mg by mouth daily. Take 1 tablet Daily for Cholestrol)   fenofibrate (TRICOR) 145 MG tablet TAKE 1 TABLET DAILY FOR BLOOD FATS (Patient taking differently: Take 145 mg by mouth daily. Take 1 tablet daily for Blood Fats)   fluticasone (FLONASE) 50 MCG/ACT nasal spray Use 1 to Sprays each nares 1 to 2 x /day (Patient taking differently: Place 1 spray into both nostrils daily as needed for allergies. )   omeprazole (PRILOSEC) 40 MG capsule Take 1 tablet 2 x /day for Acid Indigestion & Reflux   rosuvastatin (CRESTOR) 20 MG tablet Take 1 tablet 2 x  /week for Cholesterol. (Patient taking differently: Take 20 mg by mouth See admin instructions. Take 1 tablet 2 x  /week for Cholesterol. Tues and Thursdays)   tiZANidine (ZANAFLEX) 2 MG tablet Take 1 tablet (2 mg total) by mouth every 6 (six) hours as needed.   aspirin EC 81 MG tablet Take 1 tablet (81 mg total) by mouth 2 (two) times daily. (Patient not taking: Reported on 08/19/2019)   zinc gluconate 50 MG tablet Take 50 mg by mouth daily. (Patient not taking: Reported on 08/19/2019)   No current facility-administered medications on file prior to visit.   Allergies  Allergen Reactions   Atorvastatin  Other (See Comments)    Arthralgias   Percocet [Oxycodone-Acetaminophen] Other (See Comments)    Hallucinates   Past Medical History:  Diagnosis Date   Arthritis    Depression    GERD (gastroesophageal reflux disease)    Hyperlipidemia    Hypertension    Health Maintenance  Topic Date Due   COLONOSCOPY  Never done   INFLUENZA VACCINE  09/21/2019   TETANUS/TDAP  07/23/2023   COVID-19 Vaccine  Completed   Hepatitis C Screening  Completed   HIV Screening  Completed   Immunization History  Administered Date(s) Administered     Influenza Inj Mdck Quad With Preservative 11/29/2017   Influenza,inj,Quad PF,6+ Mos 01/31/2019   Influenza,inj,quad, With Preservative 12/21/2015   Influenza-Unspecified 11/01/2016   PFIZER SARS-COV-2 Vaccination 04/27/2019, 05/28/2019   PPD Test 07/22/2013, 04/24/2016, 06/26/2017, 07/31/2018   Tdap 07/22/2013   Last Colon - has declined colonoscopy  Past Surgical History:  Procedure Laterality Date   HERNIA REPAIR Bilateral    15 years ago   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   Procedure: TOTAL KNEE ARTHROPLASTY;  Surgeon: Nestor Lewandowsky, MD;  Location: [facility];  Service: Orthopedics;  Laterality: Left;   TOTAL KNEE ARTHROPLASTY Right 03/04/2014   Procedure: TOTAL KNEE ARTHROPLASTY;  Surgeon: Nestor Lewandowsky, MD;  Location: [facility] OR;  Service: Orthopedics;  Laterality: Right;   TOTAL KNEE ARTHROPLASTY Left 09/30/2018   Procedure: Revision Left Knee Arthroplasty;  Surgeon: Gean Birchwood, MD;  Location: [facility];  Service: Orthopedics;  Laterality: Left;   TOTAL KNEE REVISION Right 07/07/2019   Procedure: RIGHT TOTAL KNEE REVISION;  Surgeon: Gean Birchwood, MD;  Location: [facility];  Service: Orthopedics;  Laterality: Right;   TRANSFORAMINAL LUMBAR INTERBODY FUSION (TLIF) WITH PEDICLE SCREW FIXATION 1 LEVEL Left 03/28/2018   Procedure: LEFT SIDED LUMBAR FOUR-FIVE TRANSFORAMINAL LUMBAR INTERBODY FUSION WITH INSTRUMENTATION AND ALLOGRAFT;  Surgeon: Estill Bamberg, MD;  Location: [facility];  Service: Orthopedics;  Laterality: Left;   VASECTOMY     Family History  Problem Relation Age of Onset   Breast cancer Mother    Throat cancer Father    Hypertension Brother    Stroke Brother    Hypertension Brother    Hypertension Brother    Colon cancer Neg Hx    Stomach cancer Neg Hx    Rectal cancer Neg Hx    Social History   Socioeconomic History   Marital status: Married    Spouse name: Not on file   Number of children: Not on file   Years of education: Not on file    Highest education level: Not on file  Occupational History   Not on file  Tobacco Use   Smoking status: Never Smoker   Smokeless tobacco: Former Neurosurgeon    Types: Associate Professor Use: Never used  Substance and Sexual Activity   Alcohol use: Yes    Alcohol/week: 14.0 standard drinks    Types: 14 Cans of beer per week    Comment: 2-3 beer a day   Drug use: No   Sexual activity: Yes    Partners: Female    Birth control/protection: None     ROS Constitutional: Denies fever, chills, weight loss/gain, headaches, insomnia,  night sweats or change in appetite. Does c/o fatigue. Eyes: Denies redness, blurred vision, diplopia, discharge, itchy or watery eyes.  ENT: Denies discharge, congestion, post nasal drip, epistaxis, sore throat, earache, hearing loss, dental pain, Tinnitus, Vertigo, Sinus pain or snoring.  Cardio:  Denies chest pain, palpitations, irregular heartbeat, syncope, dyspnea, diaphoresis, orthopnea, PND, claudication or edema Respiratory: denies cough, dyspnea, DOE, pleurisy, hoarseness, laryngitis or wheezing.  Gastrointestinal: Denies dysphagia, heartburn, reflux, water brash, pain, cramps, nausea, vomiting, bloating, diarrhea, constipation, hematemesis, melena, hematochezia, jaundice or hemorrhoids Genitourinary: Denies dysuria, frequency, urgency, nocturia, hesitancy, discharge, hematuria or flank pain Musculoskeletal: Denies arthralgia, myalgia, stiffness, Jt. Swelling, pain, limp or strain/sprain. Denies Falls. Skin: Denies puritis, rash, hives, warts, acne, eczema or change in skin lesion Neuro: No weakness, tremor, incoordination, spasms, paresthesia or pain Psychiatric: Denies confusion, memory loss or sensory loss. Denies Depression. Endocrine: Denies change in weight, skin, hair change, nocturia, and paresthesia, diabetic polys, visual blurring or hyper / hypo glycemic episodes.  Heme/Lymph: No excessive bleeding, bruising or enlarged lymph  nodes.  Physical Exam  BP (!) 144/84    Pulse 76    Temp 97.9 F (36.6 C)    Resp 16    Ht 5\' 9"  (1.753 m)    Wt 168 lb 6.4 oz (76.4 kg)    BMI 24.87 kg/m   General Appearance: Well nourished and well groomed and in no apparent distress.  Eyes: PERRLA, EOMs, conjunctiva no swelling or erythema, normal fundi and vessels. Sinuses: No frontal/maxillary tenderness ENT/Mouth: EACs patent / TMs  nl. Nares clear without erythema, swelling, mucoid exudates. Oral hygiene is good. No erythema, swelling, or exudate. Tongue normal, non-obstructing. Tonsils not swollen or erythematous. Hearing normal.  Neck: Supple, thyroid not palpable. No bruits, nodes or JVD. Respiratory: Respiratory effort normal.  BS equal and clear bilateral without rales, rhonci, wheezing or stridor. Cardio: Heart sounds are normal with regular rate and rhythm and no murmurs, rubs or gallops. Peripheral pulses are normal and equal bilaterally without edema. No aortic or femoral bruits. Chest: symmetric with normal excursions and percussion.  Abdomen: Soft, with Nl bowel sounds. Nontender, no guarding, rebound, hernias, masses, or organomegaly.  Lymphatics: Non tender without lymphadenopathy.  Musculoskeletal: Full ROM all peripheral extremities, joint stability, 5/5 strength, and normal gait. Skin: Warm and dry without rashes, lesions, cyanosis, clubbing or  ecchymosis.  Neuro: Cranial nerves intact, reflexes equal bilaterally. Normal muscle tone, no cerebellar symptoms. Sensation intact.  Pysch: Alert and oriented X 3 with normal affect, insight and judgment appropriate.   Assessment and Plan  1. Annual Preventative/Screening Exam   2. Essential hypertension  - EKG 12-Lead - , RETROPERITNL ABD,  LTD - Urinalysis, Routine w reflex microscopic - Microalbumin / creatinine urine ratio - CBC with Differential/Platelet - COMPLETE METABOLIC PANEL WITH GFR - Magnesium - TSH  3. Hyperlipidemia, mixed  - EKG 12-Lead -  [place], RETROPERITNL ABD,  LTD - Lipid panel - TSH  4. Abnormal glucose  - EKG 12-Lead - [place], RETROPERITNL ABD,  LTD - Hemoglobin A1c - Insulin, random  5. Vitamin D deficiency  - VITAMIN D 25 Hydroxy   6. BPH with obstruction/lower urinary tract symptoms  - PSA  7. Screening examination for pulmonary tuberculosis  - TB Skin Test  8. Screening for colorectal cancer  - recommended Cologard  9. Prostate cancer screening  - PSA  10. Screening for ischemic heart disease  - EKG 12-Lead  11. FH: hypertension  - EKG 12-Lead - [place], RETROPERITNL ABD,  LTD  12. Screening for AAA (aortic abdominal aneurysm)  - [place], RETROPERITNL ABD,  LTD  13. Fatigue  - Iron,Total/Total Iron Binding Cap - Vitamin B12 - Testosterone - CBC with Differential/Platelet - TSH  14. Medication management  - Urinalysis, Routine  w reflex microscopic - Microalbumin / creatinine urine ratio - CBC with Differential/Platelet - COMPLETE METABOLIC PANEL WITH GFR - Magnesium - Lipid panel - TSH - Hemoglobin A1c - Insulin, random - VITAMIN D 25 Hydroxy        Patient was counseled in prudent diet, weight control to achieve/maintain BMI less than 25, BP monitoring, regular exercise and medications as discussed.  Discussed med effects and SE's. Routine screening labs and tests as requested with regular follow-up as recommended. Over 40 minutes of exam, counseling, chart review and high complex critical decision making was performed   Marinus Maw, MD

## 2019-08-18 NOTE — Patient Instructions (Signed)

## 2019-08-19 ENCOUNTER — Other Ambulatory Visit: Payer: Self-pay

## 2019-08-19 ENCOUNTER — Ambulatory Visit (INDEPENDENT_AMBULATORY_CARE_PROVIDER_SITE_OTHER): Payer: Commercial Managed Care - PPO | Admitting: Internal Medicine

## 2019-08-19 VITALS — BP 144/84 | HR 76 | Temp 97.9°F | Resp 16 | Ht 69.0 in | Wt 168.4 lb

## 2019-08-19 DIAGNOSIS — Z0000 Encounter for general adult medical examination without abnormal findings: Secondary | ICD-10-CM | POA: Diagnosis not present

## 2019-08-19 DIAGNOSIS — Z8249 Family history of ischemic heart disease and other diseases of the circulatory system: Secondary | ICD-10-CM | POA: Diagnosis not present

## 2019-08-19 DIAGNOSIS — Z111 Encounter for screening for respiratory tuberculosis: Secondary | ICD-10-CM | POA: Diagnosis not present

## 2019-08-19 DIAGNOSIS — Z79899 Other long term (current) drug therapy: Secondary | ICD-10-CM

## 2019-08-19 DIAGNOSIS — Z0001 Encounter for general adult medical examination with abnormal findings: Secondary | ICD-10-CM

## 2019-08-19 DIAGNOSIS — Z1212 Encounter for screening for malignant neoplasm of rectum: Secondary | ICD-10-CM

## 2019-08-19 DIAGNOSIS — I10 Essential (primary) hypertension: Secondary | ICD-10-CM | POA: Diagnosis not present

## 2019-08-19 DIAGNOSIS — R7309 Other abnormal glucose: Secondary | ICD-10-CM

## 2019-08-19 DIAGNOSIS — Z125 Encounter for screening for malignant neoplasm of prostate: Secondary | ICD-10-CM

## 2019-08-19 DIAGNOSIS — Z136 Encounter for screening for cardiovascular disorders: Secondary | ICD-10-CM

## 2019-08-19 DIAGNOSIS — E782 Mixed hyperlipidemia: Secondary | ICD-10-CM

## 2019-08-19 DIAGNOSIS — Z1211 Encounter for screening for malignant neoplasm of colon: Secondary | ICD-10-CM

## 2019-08-19 DIAGNOSIS — N138 Other obstructive and reflux uropathy: Secondary | ICD-10-CM

## 2019-08-19 DIAGNOSIS — R5383 Other fatigue: Secondary | ICD-10-CM

## 2019-08-19 DIAGNOSIS — N401 Enlarged prostate with lower urinary tract symptoms: Secondary | ICD-10-CM

## 2019-08-19 DIAGNOSIS — E559 Vitamin D deficiency, unspecified: Secondary | ICD-10-CM

## 2019-08-20 LAB — LIPID PANEL
Cholesterol: 195 mg/dL (ref <200)
HDL: 79 mg/dL (ref >=40)
LDL Cholesterol (Calc): 90 mg/dL (calc)
Non-HDL Cholesterol (Calc): 116 mg/dL (calc) (ref <130)
Total CHOL/HDL Ratio: 2.5 (calc) (ref <5.0)
Triglycerides: 164 mg/dL — ABNORMAL HIGH (ref <150)

## 2019-08-20 LAB — COMPLETE METABOLIC PANEL WITH GFR
AG Ratio: 2 (calc) (ref 1.0–2.5)
ALT: 28 U/L (ref 9–46)
AST: 30 U/L (ref 10–35)
Albumin: 4.8 g/dL (ref 3.6–5.1)
Alkaline phosphatase (APISO): 69 U/L (ref 35–144)
BUN: 8 mg/dL (ref 7–25)
CO2: 28 mmol/L (ref 20–32)
Calcium: 10.1 mg/dL (ref 8.6–10.3)
Chloride: 101 mmol/L (ref 98–110)
Creat: 0.94 mg/dL (ref 0.70–1.33)
GFR, Est African American: 105 mL/min/{1.73_m2} (ref >=60)
GFR, Est Non African American: 90 mL/min/{1.73_m2} (ref >=60)
Globulin: 2.4 g/dL (calc) (ref 1.9–3.7)
Glucose, Bld: 94 mg/dL (ref 65–99)
Potassium: 3.9 mmol/L (ref 3.5–5.3)
Sodium: 137 mmol/L (ref 135–146)
Total Bilirubin: 0.5 mg/dL (ref 0.2–1.2)
Total Protein: 7.2 g/dL (ref 6.1–8.1)

## 2019-08-20 LAB — IRON, TOTAL/TOTAL IRON BINDING CAP
%SAT: 13 % (calc) — ABNORMAL LOW (ref 20–48)
Iron: 58 ug/dL (ref 50–180)
TIBC: 434 mcg/dL (calc) — ABNORMAL HIGH (ref 250–425)

## 2019-08-20 LAB — PSA: PSA: 0.3 ng/mL (ref <=4.0)

## 2019-08-20 LAB — HEMOGLOBIN A1C
Hgb A1c MFr Bld: 4.8 % of total Hgb (ref <5.7)
Mean Plasma Glucose: 91 (calc)
eAG (mmol/L): 5 (calc)

## 2019-08-20 LAB — MAGNESIUM: Magnesium: 2 mg/dL (ref 1.5–2.5)

## 2019-08-20 LAB — CBC WITH DIFFERENTIAL/PLATELET
Absolute Monocytes: 550 cells/uL (ref 200–950)
Basophils Absolute: 38 cells/uL (ref 0–200)
Basophils Relative: 0.8 %
Eosinophils Absolute: 132 cells/uL (ref 15–500)
Eosinophils Relative: 2.8 %
HCT: 40.3 % (ref 38.5–50.0)
Hemoglobin: 13.1 g/dL — ABNORMAL LOW (ref 13.2–17.1)
Lymphs Abs: 1147 cells/uL (ref 850–3900)
MCH: 29.3 pg (ref 27.0–33.0)
MCHC: 32.5 g/dL (ref 32.0–36.0)
MCV: 90.2 fL (ref 80.0–100.0)
MPV: 10.5 fL (ref 7.5–12.5)
Monocytes Relative: 11.7 %
Neutro Abs: 2834 cells/uL (ref 1500–7800)
Neutrophils Relative %: 60.3 %
Platelets: 275 10*3/uL (ref 140–400)
RBC: 4.47 10*6/uL (ref 4.20–5.80)
RDW: 12.6 % (ref 11.0–15.0)
Total Lymphocyte: 24.4 %
WBC: 4.7 10*3/uL (ref 3.8–10.8)

## 2019-08-20 LAB — URINALYSIS, ROUTINE W REFLEX MICROSCOPIC
Bilirubin Urine: NEGATIVE
Glucose, UA: NEGATIVE
Hgb urine dipstick: NEGATIVE
Ketones, ur: NEGATIVE
Leukocytes,Ua: NEGATIVE
Nitrite: NEGATIVE
Protein, ur: NEGATIVE
Specific Gravity, Urine: 1.008 (ref 1.001–1.03)
pH: 7.5 (ref 5.0–8.0)

## 2019-08-20 LAB — MICROALBUMIN / CREATININE URINE RATIO
Creatinine, Urine: 44 mg/dL (ref 20–320)
Microalb, Ur: <0.2 mg/dL

## 2019-08-20 LAB — VITAMIN B12: Vitamin B-12: 340 pg/mL (ref 200–1100)

## 2019-08-20 LAB — VITAMIN D 25 HYDROXY (VIT D DEFICIENCY, FRACTURES): Vit D, 25-Hydroxy: 39 ng/mL (ref 30–100)

## 2019-08-20 LAB — INSULIN, RANDOM: Insulin: 8.1 u[IU]/mL

## 2019-08-20 LAB — TSH: TSH: 1.56 mIU/L (ref 0.40–4.50)

## 2019-08-20 LAB — TESTOSTERONE: Testosterone: 405 ng/dL (ref 250–827)

## 2019-08-20 NOTE — Progress Notes (Signed)
=========================================================  -    Iron is low and may be associated with Anemia, Fatigue or Restless Legs Syndrome   - So  please take an OTC iron supplement =========================================================  -  Vitamin B12 - OK =========================================================  -  PSA - very Low - Great  =========================================================  -  Testosterone level Normal  ======================================================  -  Total Chol =  195   and LDL Chol =  90    - Both Excellent   - Very low risk for Heart Attack  / Stroke =====================================================  -  A1c - Normal - Great - No Diabetes =========================================================  -  Vitamin D = 39 - Very Low   - - Vitamin D goal is between 70-100.   - Please make sure that you are taking your Vitamin D as directed (? 15,000 unit   - It is very important as a natural anti-inflammatory and helping the  immune system protect against viral infections, like the Covid-19    helping hair, skin, and nails, as well as reducing stroke and heart attack risk.   - It helps your bones and helps with mood.  - It also decreases numerous cancer risks so please take it as directed.   - Low Vit D is associated with a 200-300% higher risk for CANCER   and 200-300% higher risk for HEART   ATTACK  &  STROKE.    - It is also associated with higher death rate at younger ages,   autoimmune diseases like Rheumatoid arthritis, Lupus, Multiple Sclerosis.     - Also many other serious conditions, like depression, Alzheimer's  Dementia, infertility, muscle aches, fatigue, fibromyalgia - just to name a few.  ====================================================  -  All Else - CBC - Kidneys - Electrolytes -  Liver - Magnesium & Thyroid  - all  Normal /  OK =========================================================

## 2019-10-21 ENCOUNTER — Other Ambulatory Visit: Payer: Self-pay | Admitting: Internal Medicine

## 2019-10-21 MED ORDER — ESCITALOPRAM OXALATE 20 MG PO TABS: ORAL_TABLET | ORAL | 1 refills | Status: DC

## 2019-10-21 MED ORDER — TRAZODONE HCL 150 MG PO TABS
ORAL_TABLET | ORAL | 0 refills | Status: DC
Start: 2019-10-21 — End: 2019-11-24

## 2019-11-21 DIAGNOSIS — Z8711 Personal history of peptic ulcer disease: Secondary | ICD-10-CM | POA: Insufficient documentation

## 2019-11-21 DIAGNOSIS — E538 Deficiency of other specified B group vitamins: Secondary | ICD-10-CM | POA: Insufficient documentation

## 2019-11-21 DIAGNOSIS — E611 Iron deficiency: Secondary | ICD-10-CM | POA: Insufficient documentation

## 2019-11-21 NOTE — Progress Notes (Signed)
FOLLOW UP  Assessment and Plan:   Hypertension Atypically elevated today, has been recently well controlled Monitor blood pressure and pulse at home; patient to message/call if consistently greater than 130/80 Will plan to add losartan if persistently above goal  Otherwise recheck in 1-2 weeks by [place] Continue DASH diet.   Reminder to go to the ER if any CP, SOB, nausea, dizziness, severe HA, changes vision/speech, left arm numbness and tingling and jaw pain.  Cholesterol Currently at goal; on zetia 10 mg, rosuvastatin 20 mg twice weekly (hx of myalgias on high dose statin), fenofibrate Lifestyle for trigs reviewed, will have him focus on this, information provided on AVS Continue low cholesterol diet and exercise.  Check lipid panel.   Other abnormal glucose Recent A1Cs  well controlled Continue diet and exercise.  Perform daily foot/skin check, notify office of any concerning changes.  Defer A1C; check annually; check CMP today  Vitamin D Def Below goal at recent check; he has not increased dose, wasn't taking consistently at last visit but has restarted continue to recommend supplementation for goal of 60-100 Defer vitamin D level  GERD Symptoms well managed without breakthrough Continue PPI   Anemia Acute blood loss on chronic iron deficiency anemia (attributed to NSAID ulcers) S/p transfusion; was on iron supplement but stopped Also note low B12; recommended start 1000 mcg sublingual daily Continue to monitor closely   Continue diet and meds as discussed. Further disposition pending results of labs. Discussed med's effects and SE's.   Over 30 minutes of exam, counseling, chart review, and critical decision making was performed.   Future Appointments  Date Time Provider Department Center  12/08/2019  4:30 PM [facility] NURSE [facility] None  02/26/2020  4:30 PM Lucky Cowboy, MD [facility] None  08/19/2020  3:00 PM Lucky Cowboy, MD [facility] None     ----------------------------------------------------------------------------------------------------------------------  HPI 59 y.o. male  presents for 3 month follow up on hypertension, cholesterol, glucose management, weight and vitamin D deficiency.   He recently underwent L TKA revision due to loose implant on 09/30/2018, by Dr. Turner Daniels, and again recently R TKA revision in 07/07/2019 reports has done well since.   He has had anemia attributed to bleeding NSAID ulcers in 10/2017, never followed up for repeat EGD; patient is notably also overdue for screening colonoscopy; he reports has cologard at home, does plan to complete  he has a diagnosis of GERD which is currently managed by omeprazole 40 mg daily (reports taking PRN only) since 10/2017 for ulcer attributed to NSAID/steroid use, never did follow up for repeat EGD he reports symptoms is currently well controlled, and denies breakthrough reflux, burning in chest, hoarseness or cough.    BMI is Body mass index is 24.51 kg/m., he has not intentionally been working on weight due to surgery, eating less generally during the summer, walking a lot daily, plans to restart body weight  Wt Readings from Last 3 Encounters:  11/24/19 166 lb (75.3 kg)  08/19/19 168 lb 6.4 oz (76.4 kg)  07/08/19 164 lb 14.5 oz (74.8 kg)    His blood pressure has been controlled at home (133/71 just last week), today their BP is BP: (!) 170/90   He does workout. He denies chest pain, shortness of breath, PND, fatigue, palpitations. He does report has had a few episodes of dizziness today, light headed when he leans forward    He is on cholesterol medication (zetia 10 mg daily, rosuvastatin 20 mg twice a week- myalgias with increased dose,  fenofibrate 145 mg daily) and denies myalgias. His LDL cholesterol is not at goal;  The cholesterol last visit was:   Lab Results  Component Value Date   CHOL 195 08/19/2019   HDL 79 08/19/2019   LDLCALC 90 08/19/2019   TRIG  164 (H) 08/19/2019   CHOLHDL 2.5 08/19/2019    He has been working on diet and exercise for glucose management, and denies increased appetite, nausea, paresthesia of the feet, polydipsia, polyuria and visual disturbances. Last A1C in the office was:  Lab Results  Component Value Date   HGBA1C 4.8 08/19/2019   Patient is on vitamin D supplement, has increased from 15000 to 25000 daily:    Lab Results  Component Value Date   VD25OH 39 08/19/2019       Current Medications:  Current Outpatient Medications on File Prior to Visit  Medication Sig  . aspirin EC 81 MG tablet Take 1 tablet (81 mg total) by mouth 2 (two) times daily.  Marland Kitchen atenolol (TENORMIN) 100 MG tablet Take 1 tablet at Bedtime for BP (Patient taking differently: Take 100 mg by mouth at bedtime. Take 1 tablet at Bedtime for BP)  . buPROPion (WELLBUTRIN XL) 150 MG 24 hr tablet Take 1 tablet every Morning for Mood, Focus & Concentration (Patient taking differently: Take 150 mg by mouth daily. Take 1 tablet every Morning for Mood, Focus & Concentration)  . Cholecalciferol (VITAMIN D-3) 5000 UNITS TABS Take 15,000 Units by mouth daily.   . Cyanocobalamin (B-12) 1000 MCG SUBL Place 1 tablet under the tongue daily.  Marland Kitchen escitalopram (LEXAPRO) 20 MG tablet Take 1 tablet     Daily      for Mood & chronic Anxiety  . ezetimibe (ZETIA) 10 MG tablet Take 1 tablet Daily for Cholestrol (Patient taking differently: Take 10 mg by mouth daily. Take 1 tablet Daily for Cholestrol)  . fenofibrate (TRICOR) 145 MG tablet TAKE 1 TABLET DAILY FOR BLOOD FATS (Patient taking differently: Take 145 mg by mouth daily. Take 1 tablet daily for Blood Fats)  . rosuvastatin (CRESTOR) 20 MG tablet Take 1 tablet 2 x  /week for Cholesterol. (Patient taking differently: Take 20 mg by mouth See admin instructions. Take 1 tablet 2 x  /week for Cholesterol. Tues and Thursdays)  . tiZANidine (ZANAFLEX) 2 MG tablet Take 1 tablet (2 mg total) by mouth every 6 (six) hours as  needed.  . zinc gluconate 50 MG tablet Take 50 mg by mouth daily.    No current facility-administered medications on file prior to visit.     Allergies:  Allergies  Allergen Reactions  . Atorvastatin Other (See Comments)    Arthralgias  . Percocet [Oxycodone-Acetaminophen] Other (See Comments)    Hallucinates     Medical History:  Past Medical History:  Diagnosis Date  . Arthritis   . Depression   . GERD (gastroesophageal reflux disease)   . Hyperlipidemia   . Hypertension    Family history- Reviewed and unchanged Social history- Reviewed and unchanged   Review of Systems:  Review of Systems  Constitutional: Negative for malaise/fatigue and weight loss.  HENT: Negative for hearing loss and tinnitus.   Eyes: Negative for blurred vision and double vision.  Respiratory: Negative for cough, shortness of breath and wheezing.   Cardiovascular: Negative for chest pain, palpitations, orthopnea, claudication, leg swelling and PND.  Gastrointestinal: Negative for abdominal pain, blood in stool, constipation, diarrhea, heartburn, melena, nausea and vomiting.  Genitourinary: Negative.   Musculoskeletal: Negative for back  pain, joint pain and myalgias.  Skin: Negative for rash.  Neurological: Negative for dizziness, tingling, sensory change, weakness and headaches.  Endo/Heme/Allergies: Negative for polydipsia.  Psychiatric/Behavioral: Negative.   All other systems reviewed and are negative.    Physical Exam: BP (!) 170/90   Pulse 66   Temp (!) 97.5 F (36.4 C)   Wt 166 lb (75.3 kg)   SpO2 98%   BMI 24.51 kg/m  Wt Readings from Last 3 Encounters:  11/24/19 166 lb (75.3 kg)  08/19/19 168 lb 6.4 oz (76.4 kg)  07/08/19 164 lb 14.5 oz (74.8 kg)   General Appearance: Well nourished, in no apparent distress. Eyes: PERRLA, EOMs, conjunctiva no swelling or erythema Sinuses: No Frontal/maxillary tenderness ENT/Mouth: Ext aud canals clear, TMs without erythema, bulging. No  erythema, swelling, or exudate on post pharynx.  Tonsils not swollen or erythematous. Hearing normal.  Neck: Supple, thyroid normal.  Respiratory: Respiratory effort normal, BS equal bilaterally without rales, rhonchi, wheezing or stridor.  Cardio: RRR with no MRGs. Brisk peripheral pulses without edema Abdomen: Soft, + BS.  Non tender, no guarding, rebound, hernias, masses. Lymphatics: Non tender without lymphadenopathy.  Musculoskeletal: Full peripheral ROM, 5/5 strength, Normal gait.  Skin: Warm, dry without rashes, lesions, ecchymosis.  Neuro: Cranial nerves intact. No cerebellar symptoms.  Psych: Awake and oriented X 3, normal affect, Insight and Judgment appropriate.    Dan Maker, NP 5:13 PM [facility]

## 2019-11-24 ENCOUNTER — Other Ambulatory Visit: Payer: Self-pay

## 2019-11-24 ENCOUNTER — Ambulatory Visit (INDEPENDENT_AMBULATORY_CARE_PROVIDER_SITE_OTHER): Payer: Commercial Managed Care - PPO | Admitting: Adult Health

## 2019-11-24 ENCOUNTER — Encounter: Payer: Self-pay | Admitting: Adult Health

## 2019-11-24 VITALS — BP 170/90 | HR 66 | Temp 97.5°F | Wt 166.0 lb

## 2019-11-24 DIAGNOSIS — D509 Iron deficiency anemia, unspecified: Secondary | ICD-10-CM

## 2019-11-24 DIAGNOSIS — Z96652 Presence of left artificial knee joint: Secondary | ICD-10-CM

## 2019-11-24 DIAGNOSIS — E611 Iron deficiency: Secondary | ICD-10-CM

## 2019-11-24 DIAGNOSIS — I10 Essential (primary) hypertension: Secondary | ICD-10-CM | POA: Diagnosis not present

## 2019-11-24 DIAGNOSIS — E538 Deficiency of other specified B group vitamins: Secondary | ICD-10-CM

## 2019-11-24 DIAGNOSIS — E559 Vitamin D deficiency, unspecified: Secondary | ICD-10-CM | POA: Diagnosis not present

## 2019-11-24 DIAGNOSIS — Z79899 Other long term (current) drug therapy: Secondary | ICD-10-CM

## 2019-11-24 DIAGNOSIS — E782 Mixed hyperlipidemia: Secondary | ICD-10-CM

## 2019-11-24 DIAGNOSIS — R7309 Other abnormal glucose: Secondary | ICD-10-CM

## 2019-11-24 DIAGNOSIS — Z6825 Body mass index (BMI) 25.0-25.9, adult: Secondary | ICD-10-CM

## 2019-11-24 DIAGNOSIS — Z8711 Personal history of peptic ulcer disease: Secondary | ICD-10-CM

## 2019-11-24 MED ORDER — OMEPRAZOLE 40 MG PO CPDR: DELAYED_RELEASE_CAPSULE | ORAL | 1 refills | Status: DC

## 2019-11-24 MED ORDER — FLUTICASONE PROPIONATE 50 MCG/ACT NA SUSP: NASAL | 3 refills | Status: DC

## 2019-11-24 MED ORDER — FEXOFENADINE HCL 180 MG PO TABS: 180.0000 mg | ORAL_TABLET | Freq: Every day | ORAL | 2 refills | Status: DC

## 2019-11-24 NOTE — Patient Instructions (Addendum)
Goals    . Blood Pressure < 130/80    . LDL CALC < 100       Put cologard in the bathroom -  Do tomorrow morning -   Message me back with blood pressure reading tomorrow morning  Keep daily log, check different times of the day  Follow up for recheck nurse visit in 1-2 weeks   HYPERTENSION INFORMATION  Monitor your blood pressure at home, please keep a record and bring that in with you to your next office visit.   Go to the ER if any CP, SOB, nausea, dizziness, severe HA, changes vision/speech  Testing/Procedures: HOW TO TAKE YOUR BLOOD PRESSURE:  Rest 5 minutes before taking your blood pressure.  Don't smoke or drink caffeinated beverages for at least 30 minutes before.  Take your blood pressure before (not after) you eat.  Sit comfortably with your back supported and both feet on the floor (don't cross your legs).  Elevate your arm to heart level on a table or a desk.  Use the proper sized cuff. It should fit smoothly and snugly around your bare upper arm. There should be enough room to slip a fingertip under the cuff. The bottom edge of the cuff should be 1 inch above the crease of the elbow.   Your most recent BP: BP: (!) 170/90   Take your medications faithfully as instructed. Maintain a healthy weight. Get at least 150 minutes of aerobic exercise per week. Minimize salt intake. Minimize alcohol intake  DASH Eating Plan DASH stands for "Dietary Approaches to Stop Hypertension." The DASH eating plan is a healthy eating plan that has been shown to reduce high blood pressure (hypertension). Additional health benefits may include reducing the risk of type 2 diabetes mellitus, heart disease, and stroke. The DASH eating plan may also help with weight loss. WHAT DO I NEED TO KNOW ABOUT THE DASH EATING PLAN? For the DASH eating plan, you will follow these general guidelines:  Choose foods with a percent daily value for sodium of less than 5% (as listed on the food  label).  Use salt-free seasonings or herbs instead of table salt or sea salt.  Check with your health care provider or pharmacist before using salt substitutes.  Eat lower-sodium products, often labeled as "lower sodium" or "no salt added."  Eat fresh foods.  Eat more vegetables, fruits, and low-fat dairy products.  Choose whole grains. Look for the word "whole" as the first word in the ingredient list.  Choose fish and skinless chicken or [place] more often than red meat. Limit fish, poultry, and meat to 6 oz (170 g) each day.  Limit sweets, desserts, sugars, and sugary drinks.  Choose heart-healthy fats.  Limit cheese to 1 oz (28 g) per day.  Eat more home-cooked food and less restaurant, buffet, and fast food.  Limit fried foods.  Cook foods using methods other than frying.  Limit canned vegetables. If you do use them, rinse them well to decrease the sodium.  When eating at a restaurant, ask that your food be prepared with less salt, or no salt if possible. WHAT FOODS CAN I EAT? Seek help from a dietitian for individual calorie needs. Grains Whole grain or whole wheat bread. Brown rice. Whole grain or whole wheat pasta. Quinoa, bulgur, and whole grain cereals. Low-sodium cereals. Corn or whole wheat flour tortillas. Whole grain cornbread. Whole grain crackers. Low-sodium crackers. Vegetables Fresh or frozen vegetables (raw, steamed, roasted, or grilled). Low-sodium or reduced-sodium  tomato and vegetable juices. Low-sodium or reduced-sodium tomato sauce and paste. Low-sodium or reduced-sodium canned vegetables.  Fruits All fresh, canned (in natural juice), or frozen fruits. Meat and Other Protein Products Ground beef (85% or leaner), grass-fed beef, or beef trimmed of fat. Skinless chicken or [place]. Ground chicken or [place]. Pork trimmed of fat. All fish and seafood. Eggs. Dried beans, peas, or lentils. Unsalted nuts and seeds. Unsalted canned beans. Dairy Low-fat dairy  products, such as skim or 1% milk, 2% or reduced-fat cheeses, low-fat ricotta or cottage cheese, or plain low-fat yogurt. Low-sodium or reduced-sodium cheeses. Fats and Oils Tub margarines without trans fats. Light or reduced-fat mayonnaise and salad dressings (reduced sodium). Avocado. Safflower, olive, or canola oils. Natural peanut or almond butter. Other Unsalted popcorn and pretzels. The items listed above may not be a complete list of recommended foods or beverages. Contact your dietitian for more options. WHAT FOODS ARE NOT RECOMMENDED? Grains White bread. White pasta. White rice. Refined cornbread. Bagels and croissants. Crackers that contain trans fat. Vegetables Creamed or fried vegetables. Vegetables in a cheese sauce. Regular canned vegetables. Regular canned tomato sauce and paste. Regular tomato and vegetable juices. Fruits Dried fruits. Canned fruit in light or heavy syrup. Fruit juice. Meat and Other Protein Products Fatty cuts of meat. Ribs, chicken wings, bacon, sausage, bologna, salami, chitterlings, fatback, hot dogs, bratwurst, and packaged luncheon meats. Salted nuts and seeds. Canned beans with salt. Dairy Whole or 2% milk, cream, half-and-half, and cream cheese. Whole-fat or sweetened yogurt. Full-fat cheeses or blue cheese. Nondairy creamers and whipped toppings. Processed cheese, cheese spreads, or cheese curds. Condiments Onion and garlic salt, seasoned salt, table salt, and sea salt. Canned and packaged gravies. Worcestershire sauce. Tartar sauce. Barbecue sauce. Teriyaki sauce. Soy sauce, including reduced sodium. Steak sauce. Fish sauce. Oyster sauce. Cocktail sauce. Horseradish. Ketchup and mustard. Meat flavorings and tenderizers. Bouillon cubes. Hot sauce. Tabasco sauce. Marinades. Taco seasonings. Relishes. Fats and Oils Butter, stick margarine, lard, shortening, ghee, and bacon fat. Coconut, palm kernel, or palm oils. Regular salad dressings. Other Pickles and  olives. Salted popcorn and pretzels. The items listed above may not be a complete list of foods and beverages to avoid. Contact your dietitian for more information. WHERE CAN I FIND MORE INFORMATION? National Heart, Lung, and Blood Institute: [employer] Document Released: 01/26/2011 Document Revised: 06/23/2013 Document Reviewed: 12/11/2012 [facility] Patient Information 2015 [place], [place]. This information is not intended to replace advice given to you by your health care provider. Make sure you discuss any questions you have with your health care provider.

## 2019-11-25 ENCOUNTER — Telehealth: Payer: Self-pay

## 2019-11-25 ENCOUNTER — Encounter: Payer: Self-pay | Admitting: Adult Health Nurse Practitioner

## 2019-11-25 ENCOUNTER — Other Ambulatory Visit: Payer: Self-pay | Admitting: Adult Health Nurse Practitioner

## 2019-11-25 DIAGNOSIS — I10 Essential (primary) hypertension: Secondary | ICD-10-CM

## 2019-11-25 LAB — TSH: TSH: 3.27 mIU/L (ref 0.40–4.50)

## 2019-11-25 LAB — COMPLETE METABOLIC PANEL WITH GFR
AG Ratio: 2.3 (calc) (ref 1.0–2.5)
ALT: 29 U/L (ref 9–46)
AST: 31 U/L (ref 10–35)
Albumin: 5 g/dL (ref 3.6–5.1)
Alkaline phosphatase (APISO): 49 U/L (ref 35–144)
BUN: 12 mg/dL (ref 7–25)
CO2: 26 mmol/L (ref 20–32)
Calcium: 10 mg/dL (ref 8.6–10.3)
Chloride: 99 mmol/L (ref 98–110)
Creat: 1.03 mg/dL (ref 0.70–1.33)
GFR, Est African American: 94 mL/min/{1.73_m2} (ref >=60)
GFR, Est Non African American: 81 mL/min/{1.73_m2} (ref >=60)
Globulin: 2.2 g/dL (calc) (ref 1.9–3.7)
Glucose, Bld: 81 mg/dL (ref 65–139)
Potassium: 4.3 mmol/L (ref 3.5–5.3)
Sodium: 136 mmol/L (ref 135–146)
Total Bilirubin: 0.6 mg/dL (ref 0.2–1.2)
Total Protein: 7.2 g/dL (ref 6.1–8.1)

## 2019-11-25 LAB — CBC WITH DIFFERENTIAL/PLATELET
Absolute Monocytes: 498 cells/uL (ref 200–950)
Basophils Absolute: 42 cells/uL (ref 0–200)
Basophils Relative: 0.8 %
Eosinophils Absolute: 69 cells/uL (ref 15–500)
Eosinophils Relative: 1.3 %
HCT: 40.7 % (ref 38.5–50.0)
Hemoglobin: 13.4 g/dL (ref 13.2–17.1)
Lymphs Abs: 1203 cells/uL (ref 850–3900)
MCH: 28.6 pg (ref 27.0–33.0)
MCHC: 32.9 g/dL (ref 32.0–36.0)
MCV: 86.8 fL (ref 80.0–100.0)
MPV: 10.2 fL (ref 7.5–12.5)
Monocytes Relative: 9.4 %
Neutro Abs: 3487 cells/uL (ref 1500–7800)
Neutrophils Relative %: 65.8 %
Platelets: 213 10*3/uL (ref 140–400)
RBC: 4.69 10*6/uL (ref 4.20–5.80)
RDW: 14.4 % (ref 11.0–15.0)
Total Lymphocyte: 22.7 %
WBC: 5.3 10*3/uL (ref 3.8–10.8)

## 2019-11-25 LAB — LIPID PANEL
Cholesterol: 205 mg/dL — ABNORMAL HIGH (ref <200)
HDL: 103 mg/dL (ref >=40)
LDL Cholesterol (Calc): 85 mg/dL (calc)
Non-HDL Cholesterol (Calc): 102 mg/dL (calc) (ref <130)
Total CHOL/HDL Ratio: 2 (calc) (ref <5.0)
Triglycerides: 76 mg/dL (ref <150)

## 2019-11-25 LAB — MAGNESIUM: Magnesium: 1.9 mg/dL (ref 1.5–2.5)

## 2019-11-25 LAB — VITAMIN D 25 HYDROXY (VIT D DEFICIENCY, FRACTURES): Vit D, 25-Hydroxy: 105 ng/mL — ABNORMAL HIGH (ref 30–100)

## 2019-11-25 MED ORDER — CLONIDINE HCL 0.1 MG PO TABS: 0.1000 mg | ORAL_TABLET | Freq: Two times a day (BID) | ORAL | 11 refills | Status: DC | PRN

## 2019-11-25 MED ORDER — VALSARTAN 80 MG PO TABS: 80.0000 mg | ORAL_TABLET | Freq: Every day | ORAL | 11 refills | Status: DC

## 2019-11-25 NOTE — Telephone Encounter (Signed)
Patient states that last night his BP was 180/110 and this morning was 150/99 to 150/100. Please advise.

## 2019-11-25 NOTE — Progress Notes (Signed)
Message received patient blood pressure elevated: Patient states that last night his BP was 180/110 and this morning was 150/99 to 150/100. Contacted patient via telephone.  Patint denies any palpitations, chest pains, vision chang, weakness or shortness of breath.  Denies any recent steroid use or injections. Denis change in diet or increase in salty or processed foods.  Endorses increased stress at work.  Currently taking atenolol 100mg  nightly for elevated heart rate.  He reports his heart rate is in 70's-80's.  Rx Valsartan 80mg  daily and Clonidine 0.1mg  to take when blood pressure is 160/90 or higher, take first dose tonight.  Will send instructions electronically to patient.  To check blood pressure BID and record, follow up is in two weeks.   , , DNP [facility] 11/25/2019  5:31 PM

## 2019-12-08 ENCOUNTER — Ambulatory Visit (INDEPENDENT_AMBULATORY_CARE_PROVIDER_SITE_OTHER): Payer: Commercial Managed Care - PPO

## 2019-12-08 ENCOUNTER — Other Ambulatory Visit: Payer: Self-pay

## 2019-12-08 DIAGNOSIS — I10 Essential (primary) hypertension: Secondary | ICD-10-CM | POA: Diagnosis not present

## 2019-12-08 NOTE — Progress Notes (Signed)
Patient presents to the office for a nurse visit to have his Blood Pressure rechecked. Reading today was 150/90. States that his readings at home have been normal- 120s/65 to 90/60. States he feels fine when his readings are low. Vitals taken and recorded.

## 2019-12-10 ENCOUNTER — Other Ambulatory Visit: Payer: Self-pay | Admitting: Adult Health

## 2019-12-10 MED ORDER — MELOXICAM 15 MG PO TABS: 15.0000 mg | ORAL_TABLET | Freq: Every day | ORAL | 1 refills | Status: DC | PRN

## 2020-01-13 ENCOUNTER — Other Ambulatory Visit: Payer: Self-pay | Admitting: Internal Medicine

## 2020-01-19 ENCOUNTER — Other Ambulatory Visit: Payer: Self-pay | Admitting: Internal Medicine

## 2020-01-19 DIAGNOSIS — F32A Depression, unspecified: Secondary | ICD-10-CM

## 2020-01-19 DIAGNOSIS — I10 Essential (primary) hypertension: Secondary | ICD-10-CM

## 2020-01-20 ENCOUNTER — Other Ambulatory Visit: Payer: Self-pay

## 2020-01-20 ENCOUNTER — Ambulatory Visit (INDEPENDENT_AMBULATORY_CARE_PROVIDER_SITE_OTHER): Payer: Commercial Managed Care - PPO | Admitting: Adult Health

## 2020-01-20 ENCOUNTER — Encounter: Payer: Self-pay | Admitting: Adult Health

## 2020-01-20 VITALS — BP 158/78 | HR 90 | Temp 97.2°F | Wt 166.0 lb

## 2020-01-20 DIAGNOSIS — J00 Acute nasopharyngitis [common cold]: Secondary | ICD-10-CM

## 2020-01-20 DIAGNOSIS — J029 Acute pharyngitis, unspecified: Secondary | ICD-10-CM

## 2020-01-20 DIAGNOSIS — Z1152 Encounter for screening for COVID-19: Secondary | ICD-10-CM

## 2020-01-20 DIAGNOSIS — I10 Essential (primary) hypertension: Secondary | ICD-10-CM

## 2020-01-20 LAB — POCT RAPID STREP A (OFFICE): Rapid Strep A Screen: NEGATIVE

## 2020-01-20 LAB — POC COVID19 BINAXNOW: SARS Coronavirus 2 Ag: NEGATIVE

## 2020-01-20 MED ORDER — PREDNISONE 20 MG PO TABS: ORAL_TABLET | ORAL | 0 refills | Status: DC

## 2020-01-20 MED ORDER — VALSARTAN 160 MG PO TABS: 160.0000 mg | ORAL_TABLET | Freq: Every day | ORAL | 3 refills | Status: DC

## 2020-01-20 MED ORDER — PROMETHAZINE-DM 6.25-15 MG/5ML PO SYRP: 5.0000 mL | ORAL_SOLUTION | Freq: Four times a day (QID) | ORAL | 1 refills | Status: DC | PRN

## 2020-01-20 NOTE — Progress Notes (Signed)
Assessment and Plan:  Jermaine Stewart was seen today for acute visit.  Diagnoses and all orders for this visit:  Acute nasopharyngitis Rapid covid and strep negative; benign exam Suggestive of viral etiology; discussed typical duration of 5-7 days and supportive care Discussed the importance of avoiding unnecessary antibiotic therapy. Suggested symptomatic OTC remedies. Nasal saline spray for congestion. Nasal steroids, allergy pill, oral steroids offered Follow up as needed if not impoving in 3-5 days or with worsening sx will add abx -     predniSONE (DELTASONE) 20 MG tablet; 2 tablets daily for 3 days, 1 tablet daily for 4 days. -     promethazine-dextromethorphan (PROMETHAZINE-DM) 6.25-15 MG/5ML syrup; Take 5 mLs by mouth 4 (four) times daily as needed for cough.  Encounter for screening for COVID-19 -     POC COVID-19  Sore throat -     POCT rapid strep A  Essential hypertension Increase valsartan to 160 mg daily for 2 weeks then return for recheck  Monitor blood pressure at home; call if consistently over 130/80 Discussed DASH diet Advised to go to the ER if any CP, SOB, nausea, dizziness, severe HA, changes vision/speech, left arm numbness and tingling and jaw pain. -     valsartan (DIOVAN) 160 MG tablet; Take 1 tablet (160 mg total) by mouth daily.  Further disposition pending results of labs. Discussed med's effects and SE's.   Over 30 minutes of exam, counseling, chart review, and critical decision making was performed.   Future Appointments  Date Time Provider Department Center  02/03/2020  9:30 AM [facility] NURSE [facility] None  02/26/2020  4:30 PM Lucky Cowboy, MD [facility] None  08/19/2020  3:00 PM Lucky Cowboy, MD [facility] None    ------------------------------------------------------------------------------------------------------------------   HPI BP (!) 158/78   Pulse 90   Temp (!) 97.2 F (36.2 C)   Wt 166 lb (75.3 kg)   SpO2 97%   BMI 24.51  kg/m   59 y.o.male presents for evaluation of URI x 3 days. Rapid covid 19 was negative, he has had 2/2 pfizer and current flu vaccine.   He reports woke up 3 days ago with very sore and hoarse throat, also with running nose and dry cough. Sx seem progressively worse. Denies fever/chills, HA. Denies dyspnea, CP, sinus tenderness, ear pressure. Does endorse post nasal drip and sneezing. Denies watery itchy eyes, loss of taste/smell.   Does have hx of allergies, typically late summer, taking flonase as needed, did take allegra last night without benefit. He hasn't tried anything else for his sx.   No know sick contacts - had some guests over for Thanksgiving without known sick contacts.   His blood pressure has not been controlled at home, today their BP is BP: (!) 158/78 On review has been persistently elevated. Taking atenolol 100 mg AM, valsartan 80 mg PM.  He denies chest pain, shortness of breath, dizziness.   Past Medical History:  Diagnosis Date  . Arthritis   . Depression   . GERD (gastroesophageal reflux disease)   . Hyperlipidemia   . Hypertension      Allergies  Allergen Reactions  . Atorvastatin Other (See Comments)    Arthralgias  . Percocet [Oxycodone-Acetaminophen] Other (See Comments)    Hallucinates    Current Outpatient Medications on File Prior to Visit  Medication Sig  . aspirin EC 81 MG tablet Take 1 tablet (81 mg total) by mouth 2 (two) times daily.  Marland Kitchen atenolol (TENORMIN) 100 MG tablet TAKE 1 TABLET AT  BEDTIME FOR BLOOD PRESSURE  . buPROPion (WELLBUTRIN XL) 150 MG 24 hr tablet TAKE 1 TABLET EVERY MORNING FOR MOOD, FOCUS AND CONCENTRATION  . Cholecalciferol (VITAMIN D-3) 5000 UNITS TABS Take 15,000 Units by mouth daily.   . cloNIDine (CATAPRES) 0.1 MG tablet Take 1 tablet (0.1 mg total) by mouth 2 (two) times daily as needed. For blood pressure 160/90 or higher.  . Cyanocobalamin (B-12) 1000 MCG SUBL Place 1 tablet under the tongue daily.  Marland Kitchen escitalopram  (LEXAPRO) 20 MG tablet Take 1 tablet     Daily      for Mood & chronic Anxiety  . ezetimibe (ZETIA) 10 MG tablet TAKE 1 TABLET DAILY FOR CHOLESTEROL  . fenofibrate (TRICOR) 145 MG tablet TAKE 1 TABLET DAILY FOR BLOOD FATS (Patient taking differently: Take 145 mg by mouth daily. Take 1 tablet daily for Blood Fats)  . fexofenadine (ALLEGRA) 180 MG tablet Take 1 tablet (180 mg total) by mouth daily.  . fluticasone (FLONASE) 50 MCG/ACT nasal spray Use 1 to Sprays each nares 1 to 2 x /day  . meloxicam (MOBIC) 15 MG tablet Take 1 tablet (15 mg total) by mouth daily as needed for pain. Take with food. Do not combine with ibuprofen/aleve; tylenol is ok.  . omeprazole (PRILOSEC) 40 MG capsule TAKE 1 CAPSULE TWICE A DAY FOR ACID INDIGESTION AND REFLUX  . rosuvastatin (CRESTOR) 20 MG tablet Take 1 tablet 2 x  /week for Cholesterol. (Patient taking differently: Take 20 mg by mouth See admin instructions. Take 1 tablet 2 x  /week for Cholesterol. Tues and Thursdays)  . tiZANidine (ZANAFLEX) 2 MG tablet Take 1 tablet (2 mg total) by mouth every 6 (six) hours as needed.  . zinc gluconate 50 MG tablet Take 50 mg by mouth daily.   . traZODone (DESYREL) 150 MG tablet TAKE 1/2 TO 1 TABLET BY MOUTH EVERY NIGHT 1 HOUR BEFORE BEDTIME AS NEEDED FOR SLEEP (Patient not taking: Reported on 01/20/2020)   No current facility-administered medications on file prior to visit.    ROS: all negative except above.   Physical Exam:  BP (!) 158/78   Pulse 90   Temp (!) 97.2 F (36.2 C)   Wt 166 lb (75.3 kg)   SpO2 97%   BMI 24.51 kg/m   General Appearance: Well nourished, in no apparent distress. Eyes: PERRLA, EOMs, conjunctiva no swelling or erythema Sinuses: No Frontal/maxillary tenderness ENT/Mouth: Ext aud canals clear, TMs without erythema, bulging. Very mild erythema, without swelling, or exudate on post pharynx.  Tonsils not visualized. Hearing normal.  Neck: Supple, thyroid normal.  Respiratory: Respiratory  effort normal, BS equal bilaterally without rales, rhonchi, wheezing or stridor.  Cardio: RRR with no MRGs. Brisk peripheral pulses without edema.  Abdomen: Soft, + BS.  Non tender Lymphatics: Non tender without lymphadenopathy.  Musculoskeletal:  normal gait.  Skin: Warm, dry without rashes, lesions, ecchymosis.  Neuro: Normal muscle tone Psych: Awake and oriented X 3, normal affect, Insight and Judgment appropriate.     Dan Maker, NP 10:52 AM [facility]

## 2020-01-20 NOTE — Patient Instructions (Signed)

## 2020-02-02 ENCOUNTER — Other Ambulatory Visit: Payer: Self-pay | Admitting: Adult Health

## 2020-02-02 MED ORDER — AMOXICILLIN-POT CLAVULANATE 875-125 MG PO TABS: 1.0000 | ORAL_TABLET | Freq: Two times a day (BID) | ORAL | 0 refills | Status: DC

## 2020-02-03 ENCOUNTER — Ambulatory Visit (INDEPENDENT_AMBULATORY_CARE_PROVIDER_SITE_OTHER): Payer: Commercial Managed Care - PPO

## 2020-02-03 ENCOUNTER — Other Ambulatory Visit: Payer: Self-pay

## 2020-02-03 VITALS — BP 128/76 | Temp 97.6°F | Wt 166.0 lb

## 2020-02-03 DIAGNOSIS — Z79899 Other long term (current) drug therapy: Secondary | ICD-10-CM | POA: Diagnosis not present

## 2020-02-03 NOTE — Progress Notes (Signed)
REPORTS FOR blood pressure check:  BLOOD PRESSURE AT HOME HAS BEEN AS FOLLOWS:  Starting with Dec 14th 2021-----118/69  115/71---Friday 121/75---Thursday 133/78---Wednesday 128/76---Tuesday   Was told to take a whole tablet of his valsartan cause his blood pressure to drop down to 62/40. Patient states he did not call anyone but he has now since went back to taking it as follows half tablet in the AM & 1 half tablet at bedtime, like before.  He only had pictures of his bld pressure numbers on the machine.

## 2020-02-17 ENCOUNTER — Other Ambulatory Visit: Payer: Self-pay | Admitting: Adult Health

## 2020-02-17 MED ORDER — PREDNISONE 20 MG PO TABS: ORAL_TABLET | ORAL | 0 refills | Status: AC

## 2020-02-17 MED ORDER — DOXYCYCLINE HYCLATE 100 MG PO CAPS: ORAL_CAPSULE | ORAL | 0 refills | Status: DC

## 2020-02-26 ENCOUNTER — Ambulatory Visit: Payer: Commercial Managed Care - PPO | Admitting: Internal Medicine

## 2020-02-26 ENCOUNTER — Other Ambulatory Visit: Payer: Self-pay | Admitting: Internal Medicine

## 2020-02-26 ENCOUNTER — Encounter: Payer: Self-pay | Admitting: Internal Medicine

## 2020-02-26 DIAGNOSIS — R69 Illness, unspecified: Secondary | ICD-10-CM

## 2020-02-26 NOTE — Progress Notes (Signed)
RESCHEDULED

## 2020-02-26 NOTE — Progress Notes (Signed)
   R  E  S  C  H  E  D  U  L  E  D                                                                                                                                                                                                        This very nice 60 y.o.male presents for 3 month follow up with HTN, HLD, Pre-Diabetes and Vitamin D Deficiency.       Patient is treated for HTN & BP has been controlled at home. Today's  . Patient has had no complaints of any cardiac type chest pain, palpitations, dyspnea / orthopnea / PND, dizziness, claudication, or dependent edema.      Hyperlipidemia is controlled with diet & meds. Patient denies myalgias or other med SE's. 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  Last A1c was   Lab Results  Component Value Date   HGBA1C 4.8 08/19/2019       Further, the patient also has history of Vitamin D Deficiency and supplements vitamin D without any suspected side-effects. Last vitamin D was  Lab Results  Component Value Date   VD25OH 105 (H) 11/24/2019

## 2020-03-22 ENCOUNTER — Encounter: Payer: Self-pay | Admitting: Internal Medicine

## 2020-03-22 NOTE — Progress Notes (Signed)
    C  A  N  C  E  L   L  E  D                                                                                                                                         This very nice 60 y.o.  MWM presents for  6  month follow up with HTN, HLD, Pre-Diabetes and Vitamin D Deficiency.       Patient is treated for HTN & BP has been controlled at home. Today's  . Patient has had no complaints of any cardiac type chest pain, palpitations, dyspnea / orthopnea / PND, dizziness, claudication, or dependent edema.      Hyperlipidemia is controlled with diet & meds. Patient denies myalgias or other med SE's. Last Lipids were at goal withvery high HDL (103) and low LDL (85):  Lab Results  Component Value Date   CHOL 205 (H) 11/24/2019   HDL 103 11/24/2019   LDLCALC 85 11/24/2019   TRIG 76 11/24/2019   CHOLHDL 2.0 11/24/2019    Also, the patient has history of PreDiabetes and has had no symptoms of reactive hypoglycemia, diabetic polys, paresthesias or visual blurring.  Last A1c was normal & at goal:  Lab Results  Component Value Date   HGBA1C 4.8 08/19/2019       Further, the patient also has history of Vitamin D Deficiency and supplements vitamin D without any suspected side-effects. Last vitamin D was at goal:  Lab Results  Component Value Date   VD25OH 105 (H) 11/24/2019

## 2020-03-24 ENCOUNTER — Ambulatory Visit: Payer: Commercial Managed Care - PPO | Admitting: Internal Medicine

## 2020-03-24 DIAGNOSIS — R7309 Other abnormal glucose: Secondary | ICD-10-CM

## 2020-03-24 DIAGNOSIS — Z79899 Other long term (current) drug therapy: Secondary | ICD-10-CM

## 2020-03-24 DIAGNOSIS — E559 Vitamin D deficiency, unspecified: Secondary | ICD-10-CM

## 2020-03-24 DIAGNOSIS — I10 Essential (primary) hypertension: Secondary | ICD-10-CM

## 2020-03-24 DIAGNOSIS — E782 Mixed hyperlipidemia: Secondary | ICD-10-CM

## 2020-04-15 ENCOUNTER — Other Ambulatory Visit: Payer: Self-pay | Admitting: *Deleted

## 2020-04-15 ENCOUNTER — Other Ambulatory Visit: Payer: Self-pay | Admitting: Adult Health

## 2020-04-15 MED ORDER — ESCITALOPRAM OXALATE 20 MG PO TABS: ORAL_TABLET | ORAL | 0 refills | Status: DC

## 2020-04-19 ENCOUNTER — Ambulatory Visit: Payer: Commercial Managed Care - PPO | Admitting: Internal Medicine

## 2020-05-25 ENCOUNTER — Ambulatory Visit: Payer: Commercial Managed Care - PPO | Admitting: Internal Medicine

## 2020-06-12 ENCOUNTER — Other Ambulatory Visit: Payer: Self-pay | Admitting: Internal Medicine

## 2020-06-22 ENCOUNTER — Other Ambulatory Visit: Payer: Self-pay | Admitting: Internal Medicine

## 2020-06-22 DIAGNOSIS — I10 Essential (primary) hypertension: Secondary | ICD-10-CM

## 2020-06-22 DIAGNOSIS — G8929 Other chronic pain: Secondary | ICD-10-CM

## 2020-06-22 MED ORDER — VALSARTAN 160 MG PO TABS: ORAL_TABLET | ORAL | 3 refills | Status: DC

## 2020-06-22 MED ORDER — MELOXICAM 15 MG PO TABS: ORAL_TABLET | ORAL | 1 refills | Status: DC

## 2020-06-22 MED ORDER — TIZANIDINE HCL 4 MG PO TABS: ORAL_TABLET | ORAL | 3 refills | Status: DC

## 2020-06-28 ENCOUNTER — Other Ambulatory Visit: Payer: Self-pay | Admitting: Internal Medicine

## 2020-06-28 DIAGNOSIS — I10 Essential (primary) hypertension: Secondary | ICD-10-CM

## 2020-06-28 DIAGNOSIS — G8929 Other chronic pain: Secondary | ICD-10-CM

## 2020-06-28 MED ORDER — TIZANIDINE HCL 4 MG PO TABS
ORAL_TABLET | ORAL | 3 refills | Status: DC
Start: 2020-06-28 — End: 2020-07-07

## 2020-06-28 MED ORDER — VALSARTAN 160 MG PO TABS: ORAL_TABLET | ORAL | 3 refills | Status: DC

## 2020-07-02 ENCOUNTER — Other Ambulatory Visit: Payer: Self-pay | Admitting: Internal Medicine

## 2020-07-02 ENCOUNTER — Other Ambulatory Visit: Payer: Self-pay | Admitting: *Deleted

## 2020-07-02 DIAGNOSIS — E782 Mixed hyperlipidemia: Secondary | ICD-10-CM

## 2020-07-07 ENCOUNTER — Other Ambulatory Visit: Payer: Self-pay | Admitting: Internal Medicine

## 2020-07-07 DIAGNOSIS — I10 Essential (primary) hypertension: Secondary | ICD-10-CM

## 2020-07-07 DIAGNOSIS — G8929 Other chronic pain: Secondary | ICD-10-CM

## 2020-07-07 MED ORDER — TIZANIDINE HCL 4 MG PO TABS: ORAL_TABLET | ORAL | 0 refills | Status: DC

## 2020-07-07 MED ORDER — VALSARTAN 160 MG PO TABS: ORAL_TABLET | ORAL | 0 refills | Status: DC

## 2020-08-18 NOTE — Progress Notes (Signed)
Annual  Screening/Preventative Visit  & Comprehensive Evaluation & Examination  Future Appointments  Date Time Provider Department Center  08/19/2020  3:00 PM Lucky Cowboy, MD [facility] None  08/22/2021  3:00 PM Lucky Cowboy, MD [facility] None            This very nice 60 y.o.male presents for a Screening /Preventative Visit & comprehensive evaluation and management of multiple medical co-morbidities.  Patient has been followed for HTN, HLD, T2_NIDDM  Prediabetes and Vitamin D Deficiency.       Patient is treated for HTN (2000) & BP has been controlled at home. Patient's BP has been controlled at home.  Today's BP is at goal - 132/72. Patient denies any cardiac symptoms as chest pain, palpitations, shortness of breath, dizziness or ankle swelling.       Hyperlipidemia is controlled with diet & meds. Patient denies myalgias or other med SE's. Last Lipids were at goal with  very high HDL (103) and low LDL (85):    Lab Results  Component Value Date   CHOL 205 (H) 11/24/2019   HDL 103 11/24/2019   LDLCALC 85 11/24/2019   TRIG 76 11/24/2019   CHOLHDL 2.0 11/24/2019         Patient has hx/o prediabetes since    and patient denies reactive hypoglycemic symptoms, visual blurring, diabetic polys or paresthesias. Last A1c was at goal:   Lab Results  Component Value Date   HGBA1C 4.8 08/19/2019          Finally, patient has history of Vitamin D Deficiency ("25" /2014) and last vitamin D was at goal:   Lab Results  Component Value Date   VD25OH 105 (H) 11/24/2019     Current Outpatient Medications on File Prior to Visit  Medication Sig   atenolol (TENORMIN) 100 MG tablet TAKE 1 TABLET AT BEDTIME FOR BLOOD PRESSURE   buPROPion (WELLBUTRIN XL) 150 MG 24 hr tablet TAKE 1 TABLET EVERY MORNING FOR MOOD, FOCUS AND CONCENTRATION   escitalopram (LEXAPRO) 20 MG tablet Take 1 tablet     Daily      for Mood & chronic Anxiety   ezetimibe (ZETIA) 10 MG tablet TAKE 1 TABLET DAILY  FOR CHOLESTEROL   fenofibrate (TRICOR) 145 MG tablet TAKE 1 TABLET DAILY FOR BLOOD FATS (Patient taking differently: Take 145 mg by mouth daily. Take 1 tablet daily for Blood Fats)   meloxicam (MOBIC) 15 MG tablet Take  1/2 to 1 tablet  Daily  with Food for Pain & Inflammation   rosuvastatin (CRESTOR) 20 MG tablet TAKE 1 TABLET TWICE A WEEK FOR CHOLESTEROL   valsartan (DIOVAN) 160 MG tablet Take  1 tablet  Daily  for BP   tiZANidine (ZANAFLEX) 4 MG tablet Take  1 tablet   3 x /day as needed for Muscle Spasn (Patient not taking: Reported on 08/19/2020)   traZODone (DESYREL) 150 MG tablet TAKE 1/2 TO 1 TABLET BY MOUTH EVERY NIGHT 1 HOUR BEFORE BEDTIME AS NEEDED FOR SLEEP (Patient not taking: Reported on 08/19/2020)     Allergies  Allergen Reactions   Atorvastatin Other (See Comments)    Arthralgias   Percocet [Oxycodone-Acetaminophen] Other (See Comments)    Hallucinates     Past Medical History:  Diagnosis Date   Arthritis    Depression    GERD (gastroesophageal reflux disease)    Hyperlipidemia    Hypertension      Health Maintenance  Topic Date Due   Pneumococcal Vaccine 0-64 Years  old (1 - PCV) Never done   Zoster Vaccines- Shingrix (1 of 2) Never done   COLONOSCOPY Never done   COVID-19 Vaccine (3 - Pfizer risk series) 06/25/2019   INFLUENZA VACCINE  09/20/2020   TETANUS/TDAP  07/23/2023   Hepatitis C Screening  Completed   HIV Screening  Completed   HPV VACCINES  Aged Out     Immunization History  Administered Date(s) Administered   Influenza Inj Mdck Quad With Preservative 11/29/2017   Influenza,inj,Quad PF,6+ Mos 01/31/2019   Influenza,inj,quad, With Preservative 12/21/2015   Influenza-Unspecified 11/01/2016   PFIZER(Purple Top)SARS-COV-2 Vaccination 04/27/2019, 05/28/2019   PPD Test 07/22/2013, 04/24/2016, 06/26/2017, 07/31/2018, 08/19/2019   Tdap 07/22/2013    Last Colon - has declined colonoscopy  Past Surgical History:  Procedure Laterality Date    HERNIA REPAIR Bilateral    15 years ago   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   Procedure: TOTAL KNEE ARTHROPLASTY;  Surgeon: Nestor Lewandowsky, MD;  Location: [facility];  Service: Orthopedics;  Laterality: Left;   TOTAL KNEE ARTHROPLASTY Right 03/04/2014   Procedure: TOTAL KNEE ARTHROPLASTY;  Surgeon: Nestor Lewandowsky, MD;  Location: [facility];  Service: Orthopedics;  Laterality: Right;   TOTAL KNEE ARTHROPLASTY Left 09/30/2018   Procedure: Revision Left Knee Arthroplasty;  Surgeon: Gean Birchwood, MD;  Location: [facility];  Service: Orthopedics;  Laterality: Left;   TOTAL KNEE REVISION Right 07/07/2019   Procedure: RIGHT TOTAL KNEE REVISION;  Surgeon: Gean Birchwood, MD;  Location: [facility];  Service: Orthopedics;  Laterality: Right;   TRANSFORAMINAL LUMBAR INTERBODY FUSION (TLIF) WITH PEDICLE SCREW FIXATION 1 LEVEL Left 03/28/2018   Procedure: LEFT SIDED LUMBAR FOUR-FIVE TRANSFORAMINAL LUMBAR INTERBODY FUSION WITH INSTRUMENTATION AND ALLOGRAFT;  Surgeon: Estill Bamberg, MD;  Location: [facility];  Service: Orthopedics;  Laterality: Left;   VASECTOMY       Family History  Problem Relation Age of Onset   Breast cancer Mother    Throat cancer Father    Hypertension Brother    Stroke Brother    Hypertension Brother    Hypertension Brother    Colon cancer Neg Hx    Stomach cancer Neg Hx    Rectal cancer Neg Hx     Social History   Socioeconomic History   Marital status: Married    Spouse name: Kim   Number of children: 2 daughters  Occupational History   Not on file  Tobacco Use   Smoking status: Never   Smokeless tobacco: Former    Types: Associate Professor Use: Never used  Substance and Sexual Activity   Alcohol use: Yes    Alcohol/week: 14.0 standard drinks    Types: 14 Cans of beer per week    Comment: 2-3 beer a day   Drug use: No   Sexual activity: Yes    Partners: Female    ROS Constitutional: Denies fever, chills, weight loss/gain, headaches, insomnia,  night sweats or change in  appetite. Does c/o fatigue. Eyes: Denies redness, blurred vision, diplopia, discharge, itchy or watery eyes.  ENT: Denies discharge, congestion, post nasal drip, epistaxis, sore throat, earache, hearing loss, dental pain, Tinnitus, Vertigo, Sinus pain or snoring.  Cardio: Denies chest pain, palpitations, irregular heartbeat, syncope, dyspnea, diaphoresis, orthopnea, PND, claudication or edema Respiratory: denies cough, dyspnea, DOE, pleurisy, hoarseness, laryngitis or wheezing.  Gastrointestinal: Denies dysphagia, heartburn, reflux, water brash, pain, cramps, nausea, vomiting, bloating, diarrhea, constipation, hematemesis, melena, hematochezia, jaundice or hemorrhoids Genitourinary: Denies dysuria, frequency, urgency, nocturia, hesitancy, discharge,  hematuria or flank pain Musculoskeletal: Denies arthralgia, myalgia, stiffness, Jt. Swelling, pain, limp or strain/sprain. Denies Falls. Skin: Denies puritis, rash, hives, warts, acne, eczema or change in skin lesion Neuro: No weakness, tremor, incoordination, spasms, paresthesia or pain Psychiatric: Denies confusion, memory loss or sensory loss. Denies Depression. Endocrine: Denies change in weight, skin, hair change, nocturia, and paresthesia, diabetic polys, visual blurring or hyper / hypo glycemic episodes.  Heme/Lymph: No excessive bleeding, bruising or enlarged lymph nodes.   Physical Exam  BP 132/72   Pulse 67   Temp 97.7 F (36.5 C)   Resp 16   Ht 5\' 9"  (1.753 m)   Wt 172 lb 9.6 oz (78.3 kg)   SpO2 99%   BMI 25.49 kg/m   General Appearance: Well nourished and well groomed and in no apparent distress.  Eyes: PERRLA, EOMs, conjunctiva no swelling or erythema, normal fundi and vessels. Sinuses: No frontal/maxillary tenderness ENT/Mouth: EACs patent / TMs  nl. Nares clear without erythema, swelling, mucoid exudates. Oral hygiene is good. No erythema, swelling, or exudate. Tongue normal, non-obstructing. Tonsils not swollen or  erythematous. Hearing normal.  Neck: Supple, thyroid not palpable. No bruits, nodes or JVD. Respiratory: Respiratory effort normal.  BS equal and clear bilateral without rales, rhonci, wheezing or stridor. Cardio: Heart sounds are normal with regular rate and rhythm and no murmurs, rubs or gallops. Peripheral pulses are normal and equal bilaterally without edema. No aortic or femoral bruits. Chest: symmetric with normal excursions and percussion.  Abdomen: Soft, with Nl bowel sounds. Nontender, no guarding, rebound, hernias, masses, or organomegaly.  Lymphatics: Non tender without lymphadenopathy.  Musculoskeletal: Full ROM all peripheral extremities, joint stability, 5/5 strength, and normal gait. Skin: Warm and dry without rashes, lesions, cyanosis, clubbing or  ecchymosis.  Neuro: Cranial nerves intact, reflexes equal bilaterally. Normal muscle tone, no cerebellar symptoms. Sensation intact.  Pysch: Alert and oriented X 3 with normal affect, insight and judgment appropriate.   Assessment and Plan  1. Annual Preventative/Screening Exam    2. Essential hypertension  - EKG 12-Lead - COMPLETE METABOLIC PANEL WITH GFR - TSH - , RETROPERITNL ABD,  LTD - Urinalysis, Routine w reflex microscopic - Microalbumin / creatinine urine ratio - CBC with Differential/Platelet - Magnesium  3. Hyperlipidemia, mixed  - EKG 12-Lead - Lipid panel - TSH - [place], RETROPERITNL ABD,  LTD  4. Abnormal glucose  - EKG 12-Lead - Hemoglobin A1c - Insulin, random  5. Vitamin D deficiency  - VITAMIN D 25 Hydroxy  6. BPH with obstruction/lower urinary tract symptoms - Urinalysis, Routine w reflex microscopic  7. Screening examination for pulmonary tuberculosis  - TB Skin Test  8. Screening for colorectal cancer  - POC Hemoccult Bld/Stl  9. Prostate cancer screening  - POC Hemoccult Bld/Stl  - PSA  10. Screening for ischemic heart disease 2 - EKG 12-Lead  11. FH: hypertension  - EKG  12-Lead  12. Screening for AAA (aortic abdominal aneurysm)   13. Fatigue  - Testosterone - TSH - Urinalysis, Routine w reflex microscopic - Iron, Total/Total Iron Binding Cap - Vitamin B12  14. Medication management  - VITAMIN D 25 Hydroxy  - Urinalysis, Routine w reflex microscopic - Microalbumin / creatinine urine ratio - Iron, Total/Total Iron Binding Cap - Vitamin B12 - POC Hemoccult Bld/Stl - PSA - CBC with Differential/Platelet - Magnesium       Patient was counseled in prudent diet, weight control to achieve/maintain BMI less than 25, BP monitoring, regular exercise and  medications as discussed.  Discussed med effects and SE's. Routine screening labs and tests as requested with regular follow-up as recommended. Over 40 minutes of exam, counseling, chart review and high complex critical decision making was performed   Marinus MawWilliam D Farley Crooker, MD

## 2020-08-19 ENCOUNTER — Ambulatory Visit (INDEPENDENT_AMBULATORY_CARE_PROVIDER_SITE_OTHER): Payer: Commercial Managed Care - PPO | Admitting: Internal Medicine

## 2020-08-19 ENCOUNTER — Other Ambulatory Visit: Payer: Self-pay

## 2020-08-19 ENCOUNTER — Encounter: Payer: Self-pay | Admitting: Internal Medicine

## 2020-08-19 VITALS — BP 132/72 | HR 67 | Temp 97.7°F | Resp 16 | Ht 69.0 in | Wt 172.6 lb

## 2020-08-19 DIAGNOSIS — Z0001 Encounter for general adult medical examination with abnormal findings: Secondary | ICD-10-CM

## 2020-08-19 DIAGNOSIS — Z1211 Encounter for screening for malignant neoplasm of colon: Secondary | ICD-10-CM

## 2020-08-19 DIAGNOSIS — Z125 Encounter for screening for malignant neoplasm of prostate: Secondary | ICD-10-CM

## 2020-08-19 DIAGNOSIS — R5383 Other fatigue: Secondary | ICD-10-CM

## 2020-08-19 DIAGNOSIS — Z111 Encounter for screening for respiratory tuberculosis: Secondary | ICD-10-CM

## 2020-08-19 DIAGNOSIS — N138 Other obstructive and reflux uropathy: Secondary | ICD-10-CM

## 2020-08-19 DIAGNOSIS — Z8249 Family history of ischemic heart disease and other diseases of the circulatory system: Secondary | ICD-10-CM

## 2020-08-19 DIAGNOSIS — Z79899 Other long term (current) drug therapy: Secondary | ICD-10-CM

## 2020-08-19 DIAGNOSIS — E782 Mixed hyperlipidemia: Secondary | ICD-10-CM

## 2020-08-19 DIAGNOSIS — N401 Enlarged prostate with lower urinary tract symptoms: Secondary | ICD-10-CM

## 2020-08-19 DIAGNOSIS — Z0000 Encounter for general adult medical examination without abnormal findings: Secondary | ICD-10-CM

## 2020-08-19 DIAGNOSIS — Z136 Encounter for screening for cardiovascular disorders: Secondary | ICD-10-CM

## 2020-08-19 DIAGNOSIS — E559 Vitamin D deficiency, unspecified: Secondary | ICD-10-CM

## 2020-08-19 DIAGNOSIS — I10 Essential (primary) hypertension: Secondary | ICD-10-CM

## 2020-08-19 DIAGNOSIS — R7309 Other abnormal glucose: Secondary | ICD-10-CM

## 2020-08-19 NOTE — Patient Instructions (Signed)

## 2020-08-20 DIAGNOSIS — Z111 Encounter for screening for respiratory tuberculosis: Secondary | ICD-10-CM

## 2020-08-20 LAB — CBC WITH DIFFERENTIAL/PLATELET
Absolute Monocytes: 347 cells/uL (ref 200–950)
Basophils Absolute: 21 cells/uL (ref 0–200)
Basophils Relative: 0.6 %
Eosinophils Absolute: 81 cells/uL (ref 15–500)
Eosinophils Relative: 2.3 %
HCT: 38 % — ABNORMAL LOW (ref 38.5–50.0)
Hemoglobin: 12.7 g/dL — ABNORMAL LOW (ref 13.2–17.1)
Lymphs Abs: 1015 cells/uL (ref 850–3900)
MCH: 31.1 pg (ref 27.0–33.0)
MCHC: 33.4 g/dL (ref 32.0–36.0)
MCV: 93.1 fL (ref 80.0–100.0)
MPV: 10.3 fL (ref 7.5–12.5)
Monocytes Relative: 9.9 %
Neutro Abs: 2037 cells/uL (ref 1500–7800)
Neutrophils Relative %: 58.2 %
Platelets: 152 10*3/uL (ref 140–400)
RBC: 4.08 10*6/uL — ABNORMAL LOW (ref 4.20–5.80)
RDW: 13.3 % (ref 11.0–15.0)
Total Lymphocyte: 29 %
WBC: 3.5 10*3/uL — ABNORMAL LOW (ref 3.8–10.8)

## 2020-08-20 LAB — COMPLETE METABOLIC PANEL WITH GFR
AG Ratio: 2.1 (calc) (ref 1.0–2.5)
ALT: 38 U/L (ref 9–46)
AST: 33 U/L (ref 10–35)
Albumin: 4.7 g/dL (ref 3.6–5.1)
Alkaline phosphatase (APISO): 48 U/L (ref 35–144)
BUN: 9 mg/dL (ref 7–25)
CO2: 30 mmol/L (ref 20–32)
Calcium: 9.5 mg/dL (ref 8.6–10.3)
Chloride: 100 mmol/L (ref 98–110)
Creat: 0.97 mg/dL (ref 0.70–1.33)
GFR, Est African American: 100 mL/min/{1.73_m2} (ref >=60)
GFR, Est Non African American: 86 mL/min/{1.73_m2} (ref >=60)
Globulin: 2.2 g/dL (calc) (ref 1.9–3.7)
Glucose, Bld: 86 mg/dL (ref 65–99)
Potassium: 4.1 mmol/L (ref 3.5–5.3)
Sodium: 138 mmol/L (ref 135–146)
Total Bilirubin: 0.8 mg/dL (ref 0.2–1.2)
Total Protein: 6.9 g/dL (ref 6.1–8.1)

## 2020-08-20 LAB — INSULIN, RANDOM: Insulin: 2.7 u[IU]/mL

## 2020-08-20 LAB — VITAMIN D 25 HYDROXY (VIT D DEFICIENCY, FRACTURES): Vit D, 25-Hydroxy: 69 ng/mL (ref 30–100)

## 2020-08-20 LAB — HEMOGLOBIN A1C
Hgb A1c MFr Bld: 5 % of total Hgb (ref <5.7)
Mean Plasma Glucose: 97 mg/dL
eAG (mmol/L): 5.4 mmol/L

## 2020-08-20 LAB — IRON, TOTAL/TOTAL IRON BINDING CAP
%SAT: 22 % (calc) (ref 20–48)
Iron: 80 ug/dL (ref 50–180)
TIBC: 372 mcg/dL (calc) (ref 250–425)

## 2020-08-20 LAB — TSH: TSH: 1.61 mIU/L (ref 0.40–4.50)

## 2020-08-20 LAB — LIPID PANEL
Cholesterol: 148 mg/dL (ref <200)
HDL: 77 mg/dL (ref >=40)
LDL Cholesterol (Calc): 55 mg/dL (calc)
Non-HDL Cholesterol (Calc): 71 mg/dL (calc) (ref <130)
Total CHOL/HDL Ratio: 1.9 (calc) (ref <5.0)
Triglycerides: 78 mg/dL (ref <150)

## 2020-08-20 LAB — VITAMIN B12: Vitamin B-12: 332 pg/mL (ref 200–1100)

## 2020-08-20 LAB — TESTOSTERONE: Testosterone: 446 ng/dL (ref 250–827)

## 2020-08-20 LAB — MAGNESIUM: Magnesium: 1.7 mg/dL (ref 1.5–2.5)

## 2020-08-20 LAB — PSA: PSA: 0.35 ng/mL (ref <=4.00)

## 2020-08-21 NOTE — Progress Notes (Signed)
============================================================ - Test results slightly outside the reference range are not unusual. If there is anything important, I will review this with you,  otherwise it is considered normal test values.  If you have further questions,  please do not hesitate to contact me at the office or via My Chart.  ============================================================ ============================================================  -  Testosterone Level = 446 is back up near  the normal range over 450 !   9 Ways to Naturally Increase Testosterone Levels  1.   Lose Weight  If you're overweight, shedding the excess pounds may increase your testosterone levels, according to research presented at the Endocrine Society's 2012 meeting. Overweight men are more likely to have low testosterone levels to begin with, so this is an important trick to increase your body's testosterone production when you need it most.  2.   High-Intensity Exercise like Peak [employer]itness   Short intense exercise has a proven positive effect on increasing testosterone levels and preventing its decline. That's unlike aerobics or prolonged moderate exercise, which have shown to have negative or no effect on testosterone levels. Having a whey protein meal after exercise can further enhance the satiety/testosterone-boosting impact (hunger hormones cause the opposite effect on your testosterone and libido). Here's a summary of what a typical high-intensity [employer] routine might look like: " Warm up for three minutes  " Exercise as hard and fast as you can for 30 seconds. You should feel like you couldn't possibly go on another few seconds  " Recover at a slow to moderate pace for 90 seconds  " Repeat the high intensity exercise and recovery 7 more times . ;;;;;;;;;;;;;;;;;;;;;;;;;;;;;;;;;;;;;;;;;;;;;;;;;;;;;;;;;;;;;;;;;;;;;;;;;;;;;;;;;;;;;;;;;;;;;;;;;;;;;;;;;;;;;;;;;;;;;;;;;;;;;;;;;  3.   Consume  Plenty of Zinc  The mineral zinc is important for testosterone production, and supplementing your diet for as little as six weeks has been shown to cause a marked improvement in testosterone among   levels.1 Likewise, research has shown that restricting dietary sources of zinc leads to a significant decrease in testosterone, while zinc supplementation increases it - and even protects men from exercised-induced reductions in testosterone levels.  It's estimated that up to 45 percent of adults over the age of 60 may have lower than recommended zinc intakes; even when dietary supplements were added in, an estimated 20-25 percent of older adults still had inadequate zinc intakes, according to a [employer] and Nutrition Examination Survey.4 Your diet is the best source of zinc; along with protein-rich foods like  fish, other good dietary sources of zinc include raw milk, raw cheese, beans, and yogurt or kefir made from raw milk. It can be difficult to obtain enough dietary zinc if you're a vegetarian, and also for meat-eaters as well, largely because of conventional farming methods that rely heavily on chemical fertilizers and pesticides. These chemicals deplete the soil of nutrients ... nutrients like zinc that must be absorbed by plants in order to be passed on to you. In many cases, you may further deplete the nutrients in your food by the way you prepare it. For most food, cooking it will drastically reduce its levels of nutrients like zinc ... particularly over-cooking, which many people do. If you decide to use a zinc supplement, stick to a dosage of 50 mg a day, as this is the recommended adult dose. Taking too much zinc can interfere with your body's ability to absorb other minerals, especially copper, and may cause nausea as a side effect. ;;;;;;;;;;;;;;;;;;;;;;;;;;;;;;;;;;;;;;;;;;;;;;;;;;;;;;;;;;;;;;;;;;;;;;;;;;;;;;;;;;;;;;;;;;;;;;;;;;;;;;;;;;;;;;;;;;;;;;;;;;;;;;  4.   Strength Training  In  addition to [employer]  [employer]itness, strength training is also known to boost testosterone levels, provided you are doing so intensely enough. When strength training to boost testosterone, you'll want to increase the weight and lower your number of reps, and then focus on exercises that work a large number of muscles, such as dead lifts or squats.  You can "turbo-charge" your weight training by going slower. By slowing down your movement, you're actually turning it into a high-intensity exercise. Super Slow movement allows your muscle, at the microscopic level, to access the maximum number of cross-bridges between the protein filaments that produce movement in the muscle.   5.   Optimize Your Vitamin D Levels  Vitamin D, a steroid hormone, is essential for the healthy development of the nucleus of the sperm cell, and helps maintain semen quality and sperm count. Vitamin D also increases levels of testosterone, which may boost libido. In one study, overweight men who were given vitamin D supplements had a significant increase in testosterone levels after one year.5   6.   Reduce Stress  When you're under a lot of stress, your body releases high levels of the stress hormone cortisol. This hormone actually blocks the effects of testosterone,6 presumably because, from a biological standpoint, testosterone-associated behaviors (mating, competing, aggression) may have lowered your chances of survival in an emergency (hence, the "fight or flight" response is dominant, courtesy of cortisol).  7.   Limit or Eliminate Sugar from Your Diet  Testosterone levels decrease after you eat sugar, which is likely because the sugar leads to a high insulin level, another factor leading to low testosterone.7 Based on USDA estimates, the average American consumes 12 teaspoons of sugar a day, which equates to about TWO TONS of sugar during a lifetime.  8.   Eat Healthy Fats  By healthy, this means not only mono- and polyunsaturated  fats, like that found in avocadoes and nuts, but also saturated, as these are essential for building testosterone. Research shows that a diet with less than 40 percent of energy as fat (and that mainly from animal sources, i.e. saturated) lead to a decrease in testosterone levels. ie eat less animal products - as Meat , poultry and dairy. Experts agree that the ideal diet includes somewhere between 50-70 percent fat.  It's important to understand that your body requires saturated fats from animal and vegetable sources (such as meat, dairy, certain oils, and tropical plants like coconut) for optimal functioning, and if you neglect this important food group in favor of sugar, grains and other starchy carbs, your health and weight are almost guaranteed to suffer. Examples of healthy fats you can eat more of to give your testosterone levels a boost include: Olives and Olive oil  Coconuts and coconut oil Butter made from raw grass-fed organic milk Raw nuts, such as, almonds or pecans Organic pastured egg yolks Avocados Grass-fed meats Palm oil Unheated organic nut oils  9.   Boost Your Intake of Branch Chain Amino Acids (BCAA) from Foods Like Whey Protein Research suggests that BCAAs result in higher testosterone levels, particularly when taken along with resistance training. While BCAAs are available in supplement form, you'll find the highest concentrations of BCAAs like leucine in whey protein. Even when getting leucine from your natural food supply, it's often wasted or used as a building block instead of an anabolic agent. So to create the correct anabolic environment, you need to boost leucine consumption way beyond mere maintenance levels. That said, keep in mind that using leucine as  a free form amino acid can be highly counterproductive as when free form amino acids are artificially administrated, they rapidly enter your circulation while disrupting insulin function, and impairing your body's glycemic  control. Food-based leucine is really the ideal form that can benefit your muscles without side effects.  ============================================================ ============================================================  -  Lips much better   - Total Chol   = 148    and    LDL Chol  = 55  - Both  Excellent   - Very low risk for Heart Attack  / Stroke ============================================================ ============================================================  -  A1c - Normal - Great - No Diabetes  ! ============================================================ ============================================================  -  Vitamin D = 69 - Excellent  -  Please Keep dose same  ============================================================ ============================================================  -  Iron levels back in Normal Range - Great !   ============================================================ ============================================================  -  Vitamin B12 =   332    -     Very Low  (Ideal or Goal Vit B12 is between 450 - 1,100)   Low Vit B12 may be associated with Anemia , Fatigue,   Peripheral Neuropathy, Dementia, "Brain Fog", & Depression  - Recommend take a sub-lingual form of Vitamin B12 tablet   1,000 to 5,000 mcg tab that you dissolve under your tongue /Daily   - Can get Lavonia Dana - best price at [employer] or on [employer] ============================================================ ============================================================  -  PSA  - Very Low - Great   ============================================================ ============================================================  -  CBC shows WBC, RBC & Hgb - are low - and this most likely due to Low Vitamin B12 level ============================================================ ============================================================  -   Magnesium  -    1.7   -  very  low- goal is betw 2.0 - 2.5,   - So..............Marland Kitchen  Recommend that you take  Magnesium 500 mg tablet x 2 tablets  / Daily  - also important to eat lots of  leafy green vegetables   - spinach - Kale - collards - greens - okra - asparagus  - broccoli - quinoa - squash - almonds   - black, red, white beans  -  peas - green beans  ============================================================ ============================================================  -  All Else - CBC - Kidneys - Electrolytes - Liver - Magnesium & Thyroid    - all  Normal / OK ============================================================ ============================================================  -  Keep up the [place] Work  ! ============================================================ ============================================================

## 2020-09-07 ENCOUNTER — Other Ambulatory Visit: Payer: Self-pay | Admitting: Internal Medicine

## 2020-09-07 MED ORDER — ESCITALOPRAM OXALATE 20 MG PO TABS: ORAL_TABLET | ORAL | 3 refills | Status: DC

## 2020-12-20 ENCOUNTER — Other Ambulatory Visit: Payer: Self-pay | Admitting: Internal Medicine

## 2020-12-20 DIAGNOSIS — M5442 Lumbago with sciatica, left side: Secondary | ICD-10-CM

## 2020-12-20 DIAGNOSIS — G8929 Other chronic pain: Secondary | ICD-10-CM

## 2020-12-24 ENCOUNTER — Encounter: Payer: Self-pay | Admitting: Internal Medicine

## 2021-01-13 ENCOUNTER — Other Ambulatory Visit: Payer: Self-pay | Admitting: Adult Health Nurse Practitioner

## 2021-01-13 DIAGNOSIS — I10 Essential (primary) hypertension: Secondary | ICD-10-CM

## 2021-01-13 DIAGNOSIS — F32A Depression, unspecified: Secondary | ICD-10-CM

## 2021-01-20 ENCOUNTER — Encounter: Payer: Self-pay | Admitting: Internal Medicine

## 2021-02-21 ENCOUNTER — Encounter: Payer: Self-pay | Admitting: Internal Medicine

## 2021-02-21 NOTE — Progress Notes (Signed)
° ° ° °  Jermaine Stewart                                                                                                                                                                     Future Appointments  Date Time Provider Department  02/22/2021  4:00 PM Lucky Cowboy, MD [facility]  08/22/2021  3:00 PM Lucky Cowboy, MD [facility]    History of Present Illness:       This very nice 61 y.o. MWM presents for 6 month follow up with HTN, HLD, Pre-Diabetes and Vitamin D Deficiency.        Patient is treated for HTN since  2000  & BP has been controlled at home. Todays  . Patient has had no complaints of any cardiac type chest pain, palpitations, dyspnea / orthopnea / PND, dizziness, claudication, or dependent edema.       Hyperlipidemia is controlled with diet & meds. Patient denies myalgias or other med SEs. Last Lipids were  Lab Results  Component Value Date   CHOL 148 08/19/2020   HDL 77 08/19/2020   LDLCALC 55 08/19/2020   TRIG 78 08/19/2020   CHOLHDL 1.9 08/19/2020     Also, the patient is followed  expectantly frlucose intolerance and has had no symptoms of reactive hypoglycemia, diabetic polys, paresthesias or visual blurring.  Last A1c was   Lab Results  Component Value Date   HGBA1C 5.0 08/19/2020                                                         Further, the patient also has history of Vitamin D Deficiency ("25" /2014) and supplements vitamin D without any suspected side-effects. Last vitamin D was at goal :   Lab Results  Component Value Date   VD25OH 69 08/19/2020

## 2021-02-22 ENCOUNTER — Ambulatory Visit (INDEPENDENT_AMBULATORY_CARE_PROVIDER_SITE_OTHER): Payer: BC Managed Care – PPO | Admitting: Internal Medicine

## 2021-02-22 DIAGNOSIS — E559 Vitamin D deficiency, unspecified: Secondary | ICD-10-CM

## 2021-02-22 DIAGNOSIS — R7309 Other abnormal glucose: Secondary | ICD-10-CM

## 2021-02-22 DIAGNOSIS — Z91199 Patient's noncompliance with other medical treatment and regimen due to unspecified reason: Secondary | ICD-10-CM

## 2021-02-22 DIAGNOSIS — Z79899 Other long term (current) drug therapy: Secondary | ICD-10-CM

## 2021-02-22 DIAGNOSIS — E782 Mixed hyperlipidemia: Secondary | ICD-10-CM

## 2021-02-22 DIAGNOSIS — I10 Essential (primary) hypertension: Secondary | ICD-10-CM

## 2021-03-02 ENCOUNTER — Ambulatory Visit (INDEPENDENT_AMBULATORY_CARE_PROVIDER_SITE_OTHER): Payer: BC Managed Care – PPO | Admitting: Internal Medicine

## 2021-03-02 ENCOUNTER — Encounter: Payer: Self-pay | Admitting: Internal Medicine

## 2021-03-02 ENCOUNTER — Other Ambulatory Visit: Payer: Self-pay

## 2021-03-02 VITALS — BP 130/82 | HR 73 | Temp 97.9°F | Resp 16 | Ht 69.0 in | Wt 172.6 lb

## 2021-03-02 DIAGNOSIS — I10 Essential (primary) hypertension: Secondary | ICD-10-CM

## 2021-03-02 DIAGNOSIS — Z23 Encounter for immunization: Secondary | ICD-10-CM | POA: Diagnosis not present

## 2021-03-02 DIAGNOSIS — R7309 Other abnormal glucose: Secondary | ICD-10-CM

## 2021-03-02 DIAGNOSIS — E782 Mixed hyperlipidemia: Secondary | ICD-10-CM

## 2021-03-02 DIAGNOSIS — Z79899 Other long term (current) drug therapy: Secondary | ICD-10-CM

## 2021-03-02 DIAGNOSIS — E559 Vitamin D deficiency, unspecified: Secondary | ICD-10-CM

## 2021-03-02 NOTE — Progress Notes (Signed)
...         Future Appointments  Date Time Provider Department  02/22/2021  4:00 PM Unk Pinto, MD [facility]  08/22/2021         CPE  3:00 PM Unk Pinto, MD [facility]      History of Present Illness:                                                         This very nice 61 y.o. MWM presents for 6 month follow up with HTN, HLD, Pre-Diabetes and Vitamin D Deficiency.                                                          Patient is treated for HTN since  2000  & BP has been controlled at home. Todays  . Patient has had no complaints of any cardiac type chest pain, palpitations, dyspnea / orthopnea / PND, dizziness, claudication, or dependent edema.                                                         Hyperlipidemia is controlled with diet & meds. Patient denies myalgias or other med SEs. Last Lipids were        Lab Results  Component Value Date    CHOL 148 08/19/2020    HDL 77 08/19/2020    LDLCALC 55 08/19/2020    TRIG 78 08/19/2020    CHOLHDL 1.9 08/19/2020        Also, the patient is followed  expectantly for glucose intolerance and has had no symptoms of reactive hypoglycemia, diabetic polys, paresthesias or visual blurring.  Last A1c was normal and at goal :        Lab Results  Component Value Date    HGBA1C 5.0 08/19/2020                                                                                               Further, the patient also has history of Vitamin D Deficiency ("25" /2014) and supplements vitamin D without any suspected side-effects. Last vitamin D was at goal :          Lab Results  Component Value Date    VD25OH 69 08/19/2020    Current Outpatient Medications on File Prior to Visit  Medication Sig   atenolol (TENORMIN) 100 MG tablet TAKE 1 TABLET AT BEDTIME FOR BLOOD PRESSURE   buPROPion (WELLBUTRIN XL) 150 MG 24 hr tablet TAKE 1 TABLET EVERY MORNING FOR MOOD, FOCUS AND CONCENTRATION   escitalopram (LEXAPRO) 20 MG tablet Take  1 tablet  Daily  for Mood & Chronic Anxiety / Patient knows to take by mouth   ezetimibe (ZETIA) 10 MG tablet TAKE 1 TABLET DAILY FOR CHOLESTEROL   fenofibrate (TRICOR) 145 MG tablet TAKE 1 TABLET DAILY FOR BLOOD FATS (Patient taking differently: Take 145 mg by mouth daily. Take 1 tablet daily for Blood Fats)   meloxicam (MOBIC) 15 MG tablet TAKE ONE-HALF (1/2) TO ONE TABLET DAILY WITH FOOD FOR PAIN AND INFLAMMATION   omeprazole (PRILOSEC) 40 MG capsule TAKE 1 CAPSULE TWICE A DAY FOR ACID INDIGESTION AND REFLUX   rosuvastatin (CRESTOR) 20 MG tablet TAKE 1 TABLET TWICE A WEEK FOR CHOLESTEROL   tiZANidine (ZANAFLEX) 4 MG tablet Take  1 tablet   3 x /day as needed for Muscle Spasn (Patient not taking: Reported on 08/19/2020)   traZODone (DESYREL) 150 MG tablet TAKE 1/2 TO 1 TABLET BY MOUTH EVERY NIGHT 1 HOUR BEFORE BEDTIME AS NEEDED FOR SLEEP (Patient not taking: Reported on 08/19/2020)   valsartan (DIOVAN) 160 MG tablet Take  1 tablet  Daily  for BP    Allergies  Allergen Reactions   Atorvastatin Other (See Comments)    Arthralgias   Percocet [Oxycodone-Acetaminophen] Other (See Comments)    Hallucinates    PMHx:   Past Medical History:  Diagnosis Date   Arthritis    Depression    GERD (gastroesophageal reflux disease)    Hyperlipidemia    Hypertension      Immunization History  Administered Date(s) Administered   Influenza Inj Mdck Quad  11/29/2017   Influenza,inj,Quad PF 01/31/2019   Influenza,inj,quad 12/21/2015   Influenza-Unspecified 11/01/2016   PFIZER SARS-COV-2 Vacc 04/27/2019, 05/28/2019   PPD Test 07/31/2018, 08/19/2019, 08/20/2020   Tdap 07/22/2013     Past Surgical History:  Procedure Laterality Date   HERNIA REPAIR Bilateral    15 years ago   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   Procedure: TOTAL KNEE ARTHROPLASTY;  Surgeon: Kerin Salen, MD;  Location: [place];  Service: Orthopedics;  Laterality: Left;   TOTAL KNEE ARTHROPLASTY Right 03/04/2014    Procedure: TOTAL KNEE ARTHROPLASTY;  Surgeon: Kerin Salen, MD;  Location: [place];  Service: Orthopedics;  Laterality: Right;   TOTAL KNEE ARTHROPLASTY Left 09/30/2018   Procedure: Revision Left Knee Arthroplasty;  Surgeon: Frederik Pear, MD;  Location: [facility];  Service: Orthopedics;  Laterality: Left;   TOTAL KNEE REVISION Right 07/07/2019   Procedure: RIGHT TOTAL KNEE REVISION;  Surgeon: Frederik Pear, MD;  Location: [facility];  Service: Orthopedics;  Laterality: Right;   TRANSFORAMINAL LUMBAR INTERBODY FUSION (TLIF) WITH PEDICLE SCREW FIXATION 1 LEVEL Left 03/28/2018   Procedure: LEFT SIDED LUMBAR FOUR-FIVE TRANSFORAMINAL LUMBAR INTERBODY FUSION WITH INSTRUMENTATION AND ALLOGRAFT;  Surgeon: Phylliss Bob, MD;  Location: [facility];  Service: Orthopedics;  Laterality: Left;   VASECTOMY      FHx:    Reviewed / unchanged  SHx:    Reviewed / unchanged   Systems Review:  Constitutional: Denies fever, chills, wt changes, headaches, insomnia, fatigue, night sweats, change in appetite. Eyes: Denies redness, blurred vision, diplopia, discharge, itchy, watery eyes.  ENT: Denies discharge, congestion, post nasal drip, epistaxis, sore throat, earache, hearing loss, dental pain, tinnitus, vertigo, sinus pain, snoring.  CV: Denies chest pain, palpitations, irregular heartbeat, syncope, dyspnea, diaphoresis, orthopnea, PND, claudication or edema. Respiratory: denies cough, dyspnea, DOE, pleurisy, hoarseness, laryngitis, wheezing.  Gastrointestinal: Denies dysphagia, odynophagia, heartburn, reflux, water brash, abdominal pain or cramps, nausea, vomiting, bloating, diarrhea, constipation, hematemesis, melena,  hematochezia  or hemorrhoids. Genitourinary: Denies dysuria, frequency, urgency, nocturia, hesitancy, discharge, hematuria or flank pain. Musculoskeletal: Denies arthralgias, myalgias, stiffness, jt. swelling, pain, limping or strain/sprain.  Skin: Denies pruritus, rash, hives, warts, acne, eczema or change in  skin lesion(s). Neuro: No weakness, tremor, incoordination, spasms, paresthesia or pain. Psychiatric: Denies confusion, memory loss or sensory loss. Endo: Denies change in weight, skin or hair change.  Heme/Lymph: No excessive bleeding, bruising or enlarged lymph nodes.  Physical Exam  BP 130/82    Pulse 73    Temp 97.9 F (36.6 C)    Resp 16    Ht 5\' 9"  (1.753 m)    Wt 172 lb 9.6 oz (78.3 kg)    SpO2 95%    BMI 25.49 kg/m   Appears  well nourished, well groomed  and in no distress.  Eyes: PERRLA, EOMs, conjunctiva no swelling or erythema. Sinuses: No frontal/maxillary tenderness ENT/Mouth: EAC's clear, TM's nl w/o erythema, bulging. Nares clear w/o erythema, swelling, exudates. Oropharynx clear without erythema or exudates. Oral hygiene is good. Tongue normal, non obstructing. Hearing intact.  Neck: Supple. Thyroid not palpable. Car 2+/2+ without bruits, nodes or JVD. Chest: Respirations nl with BS clear & equal w/o rales, rhonchi, wheezing or stridor.  Cor: Heart sounds normal w/ regular rate and rhythm without sig. murmurs, gallops, clicks or rubs. Peripheral pulses normal and equal  without edema.  Abdomen: Soft & bowel sounds normal. Non-tender w/o guarding, rebound, hernias, masses or organomegaly.  Lymphatics: Unremarkable.  Musculoskeletal: Full ROM all peripheral extremities, joint stability, 5/5 strength and normal gait.  Skin: Warm, dry without exposed rashes, lesions or ecchymosis apparent.  Neuro: Cranial nerves intact, reflexes equal bilaterally. Sensory-motor testing grossly intact. Tendon reflexes grossly intact.  Pysch: Alert & oriented x 3.  Insight and judgement nl & appropriate. No ideations.  Assessment and Plan:  1. Essential hypertension  - Continue medication, monitor blood pressure at home.  - Continue DASH diet.  Reminder to go to the ER if any CP,  SOB, nausea, dizziness, severe HA, changes vision/speech.   - CBC with Differential/Platelet - COMPLETE  METABOLIC PANEL WITH GFR - Magnesium - TSH  2.  - Continue diet/meds, exercise,& lifestyle modifications.  - Continue monitor periodic cholesterol/liver & renal functions   Hyperlipidemia, mixed  - Lipid panel - TSH  3. Abnormal glucose  - Continue diet, exercise  - Lifestyle modifications.  - Monitor appropriate labs   - Hemoglobin A1c - Insulin, random  4. Vitamin D deficiency  - Continue supplementation   - VITAMIN D 25 Hydroxy   5. Medication management  - CBC with Differential/Platelet - COMPLETE METABOLIC PANEL WITH GFR - Magnesium - Lipid panel - TSH - Hemoglobin A1c - Insulin, random - VITAMIN D 25 Hydroxy   6. Need for immunization against influenza  - Flu Vaccine QUAD 6+ mos PF IM (Fluarix Quad PF)          Discussed  regular exercise, BP monitoring, weight control to achieve/maintain BMI less than 25 and discussed med and SE's. Recommended labs to assess and monitor clinical status with further disposition pending results of labs.  I discussed the assessment and treatment plan with the patient. The patient was provided an opportunity to ask questions and all were answered. The patient agreed with the plan and demonstrated an understanding of the instructions.  I provided over 30 minutes of exam, counseling, chart review and  complex critical decision making.  The patient was advised to call back or seek an in-person evaluation if the symptoms worsen or if the condition fails to improve as anticipated.   Kirtland Bouchard, MD

## 2021-03-02 NOTE — Patient Instructions (Signed)

## 2021-03-03 ENCOUNTER — Other Ambulatory Visit: Payer: Self-pay | Admitting: Internal Medicine

## 2021-03-03 DIAGNOSIS — E782 Mixed hyperlipidemia: Secondary | ICD-10-CM

## 2021-03-03 LAB — VITAMIN D 25 HYDROXY (VIT D DEFICIENCY, FRACTURES): Vit D, 25-Hydroxy: 35 ng/mL (ref 30–100)

## 2021-03-03 LAB — LIPID PANEL
Cholesterol: 206 mg/dL — ABNORMAL HIGH (ref <200)
HDL: 67 mg/dL (ref >=40)
Non-HDL Cholesterol (Calc): 139 mg/dL (calc) — ABNORMAL HIGH (ref <130)
Total CHOL/HDL Ratio: 3.1 (calc) (ref <5.0)
Triglycerides: 403 mg/dL — ABNORMAL HIGH (ref <150)

## 2021-03-03 LAB — CBC WITH DIFFERENTIAL/PLATELET
Absolute Monocytes: 452 cells/uL (ref 200–950)
Basophils Absolute: 40 cells/uL (ref 0–200)
Basophils Relative: 1 %
Eosinophils Absolute: 100 cells/uL (ref 15–500)
Eosinophils Relative: 2.5 %
HCT: 40.4 % (ref 38.5–50.0)
Hemoglobin: 13.9 g/dL (ref 13.2–17.1)
Lymphs Abs: 944 cells/uL (ref 850–3900)
MCH: 32 pg (ref 27.0–33.0)
MCHC: 34.4 g/dL (ref 32.0–36.0)
MCV: 92.9 fL (ref 80.0–100.0)
MPV: 10.2 fL (ref 7.5–12.5)
Monocytes Relative: 11.3 %
Neutro Abs: 2464 cells/uL (ref 1500–7800)
Neutrophils Relative %: 61.6 %
Platelets: 183 10*3/uL (ref 140–400)
RBC: 4.35 10*6/uL (ref 4.20–5.80)
RDW: 12.6 % (ref 11.0–15.0)
Total Lymphocyte: 23.6 %
WBC: 4 10*3/uL (ref 3.8–10.8)

## 2021-03-03 LAB — COMPLETE METABOLIC PANEL WITH GFR
AG Ratio: 2 (calc) (ref 1.0–2.5)
ALT: 26 U/L (ref 9–46)
AST: 27 U/L (ref 10–35)
Albumin: 4.7 g/dL (ref 3.6–5.1)
Alkaline phosphatase (APISO): 57 U/L (ref 35–144)
BUN: 11 mg/dL (ref 7–25)
CO2: 27 mmol/L (ref 20–32)
Calcium: 9.8 mg/dL (ref 8.6–10.3)
Chloride: 100 mmol/L (ref 98–110)
Creat: 1.02 mg/dL (ref 0.70–1.30)
Globulin: 2.4 g/dL (calc) (ref 1.9–3.7)
Glucose, Bld: 87 mg/dL (ref 65–99)
Potassium: 4.2 mmol/L (ref 3.5–5.3)
Sodium: 135 mmol/L (ref 135–146)
Total Bilirubin: 0.8 mg/dL (ref 0.2–1.2)
Total Protein: 7.1 g/dL (ref 6.1–8.1)
eGFR: 85 mL/min/{1.73_m2} (ref >=60)

## 2021-03-03 LAB — MAGNESIUM: Magnesium: 2 mg/dL (ref 1.5–2.5)

## 2021-03-03 LAB — TSH: TSH: 1.39 mIU/L (ref 0.40–4.50)

## 2021-03-03 LAB — HEMOGLOBIN A1C
Hgb A1c MFr Bld: 5 % of total Hgb (ref <5.7)
Mean Plasma Glucose: 97 mg/dL
eAG (mmol/L): 5.4 mmol/L

## 2021-03-03 LAB — INSULIN, RANDOM: Insulin: 6.6 u[IU]/mL

## 2021-03-03 MED ORDER — ROSUVASTATIN CALCIUM 20 MG PO TABS: ORAL_TABLET | ORAL | 0 refills | Status: DC

## 2021-08-22 ENCOUNTER — Encounter: Payer: Commercial Managed Care - PPO | Admitting: Internal Medicine

## 2021-09-04 ENCOUNTER — Other Ambulatory Visit: Payer: Self-pay | Admitting: Adult Health

## 2021-09-04 DIAGNOSIS — G8929 Other chronic pain: Secondary | ICD-10-CM

## 2021-09-07 ENCOUNTER — Encounter: Payer: Self-pay | Admitting: Internal Medicine

## 2021-09-07 NOTE — Patient Instructions (Signed)

## 2021-09-07 NOTE — Progress Notes (Signed)
Annual  Screening/Preventative Visit  & Comprehensive Evaluation & Examination   Future Appointments  Date Time Provider Department  09/08/2021  3:00 PM Lucky Cowboy, MD [facility]  09/13/2022  3:00 PM Lucky Cowboy, MD [facility]         This very nice 61 y.o. MWM presents for a Screening /Preventative Visit & comprehensive evaluation and management of multiple medical co-morbidities.  Patient has been followed for HTN, HLD, Prediabetes and Vitamin D Deficiency.       Patient is treated for HTN (2014) & BP has been controlled on Atenolol . Patient's BP has been controlled and today's BP is at goal - 132/84. Patient denies any cardiac symptoms as chest pain, palpitations, shortness of breath, dizziness or ankle swelling.       Hyperlipidemia is controlled with diet & Rosuvastatin/ Fenofibrate /Ezetimibe. Patient denies myalgias or other med SE's. Previous Lipids were at goal with very high HDL (103) and low LDL (85) :    Lab Results  Component Value Date   CHOL 206 (H) 03/02/2021   HDL 67 03/02/2021   LDLCALC not calculated. 03/02/2021   TRIG 403 (H) 03/02/2021   CHOLHDL 3.1 03/02/2021        Patient is followed expectantly for glucose intolerance  and patient denies reactive hypoglycemic symptoms, visual blurring, diabetic polys or paresthesias. Last A1c was at goal :    Lab Results  Component Value Date   HGBA1C 5.0 03/02/2021        Finally, patient has history of Vitamin D Deficiency ("25" /2014) and last vitamin D was not  at goal :   Lab Results  Component Value Date   VD25OH 35 03/02/2021       Current Outpatient Medications:     atenolol (TENORMIN) 100 MG tablet, TAKE 1 TABLET AT BEDTIME FOR BLOOD PRESSURE, Disp: 90 tablet, Rfl: 3    buPROPion (WELLBUTRIN XL) 150 MG 24 hr tablet, TAKE 1 TABLET EVERY MORNING FOR MOOD, FOCUS AND CONCENTRATION, Disp: 90 tablet, Rfl: 3    escitalopram (LEXAPRO) 20 MG tablet, Take 1 tablet  Daily      ezetimibe (ZETIA)  10 MG tablet, TAKE 1 TABLET DAILY    fenofibrate (TRICOR) 145 MG tablet, TAKE 1 TABLET DAILY    meloxicam (MOBIC) 15 MG tablet, Take  1 tablet   Daily  with food for Pain & Inflammation      omeprazole (PRILOSEC) 40 MG capsule, TAKE 1 CAPSULE TWICE A DAY     rosuvastatin (CRESTOR) 20 MG tablet, Take 1 tablet every other day on even days of month    tiZANidine (ZANAFLEX) 4 MG tablet, Take  1 tablet   3 x /day as needed for Muscle Spasm    valsartan (DIOVAN) 160 MG tablet, Take  1 tablet  Daily  for B   Allergies  Allergen Reactions   Atorvastatin Other (See Comments)    Arthralgias   Percocet [Oxycodone-Acetaminophen] Other (See Comments)    Hallucinates     Past Medical History:  Diagnosis Date   Arthritis    Depression    GERD (gastroesophageal reflux disease)    Hyperlipidemia    Hypertension      Health Maintenance  Topic Date Due   Pneumococcal Vaccine 26-54 Years old (1 - PCV) Never done   Zoster Vaccines- Shingrix (1 of 2) Never done   COLONOSCOPY Never done   COVID-19 Vaccine (3 - Pfizer risk series) 06/25/2019   INFLUENZA VACCINE  09/20/2020  TETANUS/TDAP  07/23/2023   Hepatitis C Screening  Completed   HIV Screening  Completed   HPV VACCINES  Aged Out     Immunization History  Administered Date(s) Administered   Influenza Inj Mdck Quad With Preservative 11/29/2017   Influenza,inj,Quad PF,6+ Mos 01/31/2019   Influenza,inj,quad, With Preservative 12/21/2015   Influenza-Unspecified 11/01/2016   PFIZER(Purple Top)SARS-COV-2 Vaccination 04/27/2019, 05/28/2019   PPD Test 07/22/2013, 04/24/2016, 06/26/2017, 07/31/2018, 08/19/2019   Tdap 07/22/2013    Last Colon - has declined colonoscopy  Past Surgical History:  Procedure Laterality Date   HERNIA REPAIR Bilateral    15 years ago   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   Procedure: TOTAL KNEE ARTHROPLASTY;  Surgeon: Nestor Lewandowsky, MD;  Location: [facility];  Service: Orthopedics;  Laterality: Left;    TOTAL KNEE ARTHROPLASTY Right 03/04/2014   Procedure: TOTAL KNEE ARTHROPLASTY;  Surgeon: Nestor Lewandowsky, MD;  Location: [facility];  Service: Orthopedics;  Laterality: Right;   TOTAL KNEE ARTHROPLASTY Left 09/30/2018   Procedure: Revision Left Knee Arthroplasty;  Surgeon: Gean Birchwood, MD;  Location: [facility];  Service: Orthopedics;  Laterality: Left;   TOTAL KNEE REVISION Right 07/07/2019   Procedure: RIGHT TOTAL KNEE REVISION;  Surgeon: Gean Birchwood, MD;  Location: [facility];  Service: Orthopedics;  Laterality: Right;   TRANSFORAMINAL LUMBAR INTERBODY FUSION (TLIF) WITH PEDICLE SCREW FIXATION 1 LEVEL Left 03/28/2018   Procedure: LEFT SIDED LUMBAR FOUR-FIVE TRANSFORAMINAL LUMBAR INTERBODY FUSION WITH INSTRUMENTATION AND ALLOGRAFT;  Surgeon: Estill Bamberg, MD;  Location: [facility];  Service: Orthopedics;  Laterality: Left;   VASECTOMY       Family History  Problem Relation Age of Onset   Breast cancer Mother    Throat cancer Father    Hypertension Brother    Stroke Brother    Hypertension Brother    Hypertension Brother    Colon cancer Neg Hx    Stomach cancer Neg Hx    Rectal cancer Neg Hx     Social History   Socioeconomic History   Marital status: Married    Spouse name: Kim   Number of children: 2 daughters  Occupational History   Not on file  Tobacco Use   Smoking status: Never   Smokeless tobacco: Former    Types: Associate Professor Use: Never used  Substance and Sexual Activity   Alcohol use: Yes    Alcohol/week: 14.0 standard drinks    Types: 14 Cans of beer per week    Comment: 2-3 beer a day   Drug use: No   Sexual activity: Yes    Partners: Female    ROS Constitutional: Denies fever, chills, weight loss/gain, headaches, insomnia,  night sweats or change in appetite. Does c/o fatigue. Eyes: Denies redness, blurred vision, diplopia, discharge, itchy or watery eyes.  ENT: Denies discharge, congestion, post nasal drip, epistaxis, sore throat, earache, hearing loss,  dental pain, Tinnitus, Vertigo, Sinus pain or snoring.  Cardio: Denies chest pain, palpitations, irregular heartbeat, syncope, dyspnea, diaphoresis, orthopnea, PND, claudication or edema Respiratory: denies cough, dyspnea, DOE, pleurisy, hoarseness, laryngitis or wheezing.  Gastrointestinal: Denies dysphagia, heartburn, reflux, water brash, pain, cramps, nausea, vomiting, bloating, diarrhea, constipation, hematemesis, melena, hematochezia, jaundice or hemorrhoids Genitourinary: Denies dysuria, frequency, urgency, nocturia, hesitancy, discharge, hematuria or flank pain Musculoskeletal: Denies arthralgia, myalgia, stiffness, Jt. Swelling, pain, limp or strain/sprain. Denies Falls. Skin: Denies puritis, rash, hives, warts, acne, eczema or change in skin lesion Neuro: No weakness, tremor, incoordination, spasms, paresthesia or pain  Psychiatric: Denies confusion, memory loss or sensory loss. Denies Depression. Endocrine: Denies change in weight, skin, hair change, nocturia, and paresthesia, diabetic polys, visual blurring or hyper / hypo glycemic episodes.  Heme/Lymph: No excessive bleeding, bruising or enlarged lymph nodes.   Physical Exam  BP 132/84   Pulse 65   Temp (!) 96.8 F (36 C)   Ht 5\' 8"  (1.727 m)   Wt 169 lb (76.7 kg)   SpO2 99%   BMI 25.70 kg/m   General Appearance: Well nourished and well groomed and in no apparent distress.  Eyes: PERRLA, EOMs, conjunctiva no swelling or erythema, normal fundi and vessels. Sinuses: No frontal/maxillary tenderness ENT/Mouth: EACs patent / TMs  nl. Nares clear without erythema, swelling, mucoid exudates. Oral hygiene is good. No erythema, swelling, or exudate. Tongue normal, non-obstructing. Tonsils not swollen or erythematous. Hearing normal.  Neck: Supple, thyroid not palpable. No bruits, nodes or JVD. Respiratory: Respiratory effort normal.  BS equal and clear bilateral without rales, rhonci, wheezing or stridor. Cardio: Heart sounds are  normal with regular rate and rhythm and no murmurs, rubs or gallops. Peripheral pulses are normal and equal bilaterally without edema. No aortic or femoral bruits. Chest: symmetric with normal excursions and percussion.  Abdomen: Soft, with Nl bowel sounds. Nontender, no guarding, rebound, hernias, masses, or organomegaly.  Lymphatics: Non tender without lymphadenopathy.  Musculoskeletal: Full ROM all peripheral extremities, joint stability, 5/5 strength, and normal gait. Skin: Warm and dry without rashes, lesions, cyanosis, clubbing or  ecchymosis.  Neuro: Cranial nerves intact, reflexes equal bilaterally. Normal muscle tone, no cerebellar symptoms. Sensation intact.  Pysch: Alert and oriented   x 3 with normal affect, insight and judgment appropriate.   Assessment and Plan  1. Annual Preventative/Screening Exam    2. Essential hypertension  - EKG 12-Lead - COMPLETE METABOLIC PANEL WITH GFR - TSH - , RETROPERITNL ABD,  LTD - Urinalysis, Routine w reflex microscopic - Microalbumin / creatinine urine ratio - CBC with Differential/Platelet - Magnesium  3. Hyperlipidemia, mixed  - EKG 12-Lead - Lipid panel - TSH - [place], RETROPERITNL ABD,  LTD  4. Abnormal glucose  - EKG 12-Lead - Hemoglobin A1c - Insulin, random  5. Vitamin D deficiency  - VITAMIN D 25 Hydroxy  6. BPH with obstruction/lower urinary tract symptoms - Urinalysis, Routine w reflex microscopic  7. Screening examination for pulmonary tuberculosis  - TB Skin Test  8. Screening for colorectal cancer  - POC Hemoccult Bld/Stl  9. Prostate cancer screening  - POC Hemoccult Bld/Stl  - PSA  10. Screening for ischemic heart disease 2 - EKG 12-Lead  11. FH: hypertension  - EKG 12-Lead  12. Screening for AAA (aortic abdominal aneurysm)   13. Fatigue  - Testosterone - TSH - Urinalysis, Routine w reflex microscopic - Iron, Total/Total Iron Binding Cap - Vitamin B12  14. Medication  management  - VITAMIN D 25 Hydroxy  - Urinalysis, Routine w reflex microscopic - Microalbumin / creatinine urine ratio - Iron, Total/Total Iron Binding Cap - Vitamin B12 - POC Hemoccult Bld/Stl - PSA - CBC with Differential/Platelet - Magnesium       Patient was counseled in prudent diet, weight control to achieve/maintain BMI less than 25, BP monitoring, regular exercise and medications as discussed.  Discussed med effects and SE's. Routine screening labs and tests as requested with regular follow-up as recommended. Over 40 minutes of exam, counseling, chart review and high complex critical decision making was performed   US,  MD

## 2021-09-08 ENCOUNTER — Encounter: Payer: Self-pay | Admitting: Internal Medicine

## 2021-09-08 ENCOUNTER — Ambulatory Visit (INDEPENDENT_AMBULATORY_CARE_PROVIDER_SITE_OTHER): Payer: BC Managed Care – PPO | Admitting: Internal Medicine

## 2021-09-08 VITALS — BP 132/84 | HR 65 | Temp 96.8°F | Ht 68.0 in | Wt 169.0 lb

## 2021-09-08 DIAGNOSIS — Z130 Encounter for screening for diseases of the blood and blood-forming organs and certain disorders involving the immune mechanism: Secondary | ICD-10-CM

## 2021-09-08 DIAGNOSIS — Z8249 Family history of ischemic heart disease and other diseases of the circulatory system: Secondary | ICD-10-CM

## 2021-09-08 DIAGNOSIS — I700 Atherosclerosis of aorta: Secondary | ICD-10-CM | POA: Diagnosis not present

## 2021-09-08 DIAGNOSIS — N401 Enlarged prostate with lower urinary tract symptoms: Secondary | ICD-10-CM | POA: Diagnosis not present

## 2021-09-08 DIAGNOSIS — N138 Other obstructive and reflux uropathy: Secondary | ICD-10-CM

## 2021-09-08 DIAGNOSIS — Z13220 Encounter for screening for lipoid disorders: Secondary | ICD-10-CM

## 2021-09-08 DIAGNOSIS — Z1389 Encounter for screening for other disorder: Secondary | ICD-10-CM | POA: Diagnosis not present

## 2021-09-08 DIAGNOSIS — Z136 Encounter for screening for cardiovascular disorders: Secondary | ICD-10-CM

## 2021-09-08 DIAGNOSIS — G8929 Other chronic pain: Secondary | ICD-10-CM

## 2021-09-08 DIAGNOSIS — I10 Essential (primary) hypertension: Secondary | ICD-10-CM

## 2021-09-08 DIAGNOSIS — E782 Mixed hyperlipidemia: Secondary | ICD-10-CM

## 2021-09-08 DIAGNOSIS — R350 Frequency of micturition: Secondary | ICD-10-CM | POA: Diagnosis not present

## 2021-09-08 DIAGNOSIS — Z0001 Encounter for general adult medical examination with abnormal findings: Secondary | ICD-10-CM

## 2021-09-08 DIAGNOSIS — E559 Vitamin D deficiency, unspecified: Secondary | ICD-10-CM

## 2021-09-08 DIAGNOSIS — Z125 Encounter for screening for malignant neoplasm of prostate: Secondary | ICD-10-CM | POA: Diagnosis not present

## 2021-09-08 DIAGNOSIS — Z79899 Other long term (current) drug therapy: Secondary | ICD-10-CM | POA: Diagnosis not present

## 2021-09-08 DIAGNOSIS — Z1329 Encounter for screening for other suspected endocrine disorder: Secondary | ICD-10-CM

## 2021-09-08 DIAGNOSIS — R7309 Other abnormal glucose: Secondary | ICD-10-CM

## 2021-09-08 DIAGNOSIS — Z111 Encounter for screening for respiratory tuberculosis: Secondary | ICD-10-CM

## 2021-09-08 DIAGNOSIS — Z1211 Encounter for screening for malignant neoplasm of colon: Secondary | ICD-10-CM

## 2021-09-08 DIAGNOSIS — Z0000 Encounter for general adult medical examination without abnormal findings: Secondary | ICD-10-CM | POA: Diagnosis not present

## 2021-09-08 DIAGNOSIS — Z131 Encounter for screening for diabetes mellitus: Secondary | ICD-10-CM

## 2021-09-08 DIAGNOSIS — R5383 Other fatigue: Secondary | ICD-10-CM

## 2021-09-08 MED ORDER — MELOXICAM 15 MG PO TABS: ORAL_TABLET | ORAL | 3 refills | Status: DC

## 2021-09-09 LAB — URINALYSIS, ROUTINE W REFLEX MICROSCOPIC
Bilirubin Urine: NEGATIVE
Glucose, UA: NEGATIVE
Hgb urine dipstick: NEGATIVE
Ketones, ur: NEGATIVE
Leukocytes,Ua: NEGATIVE
Nitrite: NEGATIVE
Protein, ur: NEGATIVE
Specific Gravity, Urine: 1.024 (ref 1.001–1.035)
pH: 5.5 (ref 5.0–8.0)

## 2021-09-09 LAB — CBC WITH DIFFERENTIAL/PLATELET
Absolute Monocytes: 510 cells/uL (ref 200–950)
Basophils Absolute: 40 cells/uL (ref 0–200)
Basophils Relative: 0.8 %
Eosinophils Absolute: 70 cells/uL (ref 15–500)
Eosinophils Relative: 1.4 %
HCT: 37.7 % — ABNORMAL LOW (ref 38.5–50.0)
Hemoglobin: 13 g/dL — ABNORMAL LOW (ref 13.2–17.1)
Lymphs Abs: 1575 cells/uL (ref 850–3900)
MCH: 32.5 pg (ref 27.0–33.0)
MCHC: 34.5 g/dL (ref 32.0–36.0)
MCV: 94.3 fL (ref 80.0–100.0)
MPV: 10.6 fL (ref 7.5–12.5)
Monocytes Relative: 10.2 %
Neutro Abs: 2805 cells/uL (ref 1500–7800)
Neutrophils Relative %: 56.1 %
Platelets: 194 10*3/uL (ref 140–400)
RBC: 4 10*6/uL — ABNORMAL LOW (ref 4.20–5.80)
RDW: 13 % (ref 11.0–15.0)
Total Lymphocyte: 31.5 %
WBC: 5 10*3/uL (ref 3.8–10.8)

## 2021-09-09 LAB — LIPID PANEL
Cholesterol: 213 mg/dL — ABNORMAL HIGH (ref <200)
HDL: 60 mg/dL (ref >=40)
LDL Cholesterol (Calc): 105 mg/dL (calc) — ABNORMAL HIGH
Non-HDL Cholesterol (Calc): 153 mg/dL (calc) — ABNORMAL HIGH (ref <130)
Total CHOL/HDL Ratio: 3.6 (calc) (ref <5.0)
Triglycerides: 363 mg/dL — ABNORMAL HIGH (ref <150)

## 2021-09-09 LAB — MAGNESIUM: Magnesium: 1.7 mg/dL (ref 1.5–2.5)

## 2021-09-09 LAB — COMPLETE METABOLIC PANEL WITH GFR
AG Ratio: 1.9 (calc) (ref 1.0–2.5)
ALT: 17 U/L (ref 9–46)
AST: 18 U/L (ref 10–35)
Albumin: 4.2 g/dL (ref 3.6–5.1)
Alkaline phosphatase (APISO): 61 U/L (ref 35–144)
BUN: 13 mg/dL (ref 7–25)
CO2: 24 mmol/L (ref 20–32)
Calcium: 9.4 mg/dL (ref 8.6–10.3)
Chloride: 102 mmol/L (ref 98–110)
Creat: 1.05 mg/dL (ref 0.70–1.30)
Globulin: 2.2 g/dL (calc) (ref 1.9–3.7)
Glucose, Bld: 98 mg/dL (ref 65–99)
Potassium: 3.9 mmol/L (ref 3.5–5.3)
Sodium: 136 mmol/L (ref 135–146)
Total Bilirubin: 0.4 mg/dL (ref 0.2–1.2)
Total Protein: 6.4 g/dL (ref 6.1–8.1)
eGFR: 82 mL/min/{1.73_m2} (ref >=60)

## 2021-09-09 LAB — IRON, TOTAL/TOTAL IRON BINDING CAP
%SAT: 15 % (calc) — ABNORMAL LOW (ref 20–48)
Iron: 56 ug/dL (ref 50–180)
TIBC: 363 mcg/dL (calc) (ref 250–425)

## 2021-09-09 LAB — MICROALBUMIN / CREATININE URINE RATIO
Creatinine, Urine: 204 mg/dL (ref 20–320)
Microalb Creat Ratio: 2 mcg/mg creat (ref <30)
Microalb, Ur: 0.5 mg/dL

## 2021-09-09 LAB — TSH: TSH: 1.09 mIU/L (ref 0.40–4.50)

## 2021-09-09 LAB — TESTOSTERONE: Testosterone: 190 ng/dL — ABNORMAL LOW (ref 250–827)

## 2021-09-09 LAB — PSA: PSA: 0.59 ng/mL (ref <=4.00)

## 2021-09-09 LAB — VITAMIN D 25 HYDROXY (VIT D DEFICIENCY, FRACTURES): Vit D, 25-Hydroxy: 65 ng/mL (ref 30–100)

## 2021-09-09 LAB — HEMOGLOBIN A1C
Hgb A1c MFr Bld: 4.8 % of total Hgb (ref <5.7)
Mean Plasma Glucose: 91 mg/dL
eAG (mmol/L): 5 mmol/L

## 2021-09-09 LAB — VITAMIN B12: Vitamin B-12: 228 pg/mL (ref 200–1100)

## 2021-09-09 LAB — INSULIN, RANDOM: Insulin: 19.2 u[IU]/mL — ABNORMAL HIGH

## 2021-09-10 NOTE — Progress Notes (Signed)
<><><><><><><><><><><><><><><><><><><><><><><><><><><><><><><><><> <><><><><><><><><><><><><><><><><><><><><><><><><><><><><><><><><> - Test results slightly outside the reference range are not unusual. If there is anything important, I will review this with you,  otherwise it is considered normal test values.  If you have further questions,  please do not hesitate to contact me at the office or via My Chart.  <><><><><><><><><><><><><><><><><><><><><><><><><><><><><><><><><> <><><><><><><><><><><><><><><><><><><><><><><><><><><><><><><><><>  -  Iron level is low  - recommend take an OTC iron supplement AND   - > Eat more Veggies with Iron as                               Carrots, Beets , all leafy green veggies as Spinach,                               Collards, Turnip - Mustard or Mixed Greens, Kale,                              Asparagus, Broccoli, Brussel Sprouts, Green Beans /                             Green peas, Soybeans, Lentils, Sweet Potatoes  <><><><><><><><><><><><><><><><><><><><><><><><><><><><><><><><><> <><><><><><><><><><><><><><><><><><><><><><><><><><><><><><><><><>  -  Testosterone still low = 190  ( Ideal or Goal is between 450-850 )   -  Recommend take Zinc 50 mg tab which may help raise                                                                       Testosterone levels naturally   -  Also exercise 20-30 minutes  2 x /day will help raise Testosterone levels  -  Losing weight helps raise Testosterone levels as                                     fat tissue metabolizes Testosterone into Estrogen !   <><><><><><><><><><><><><><><><><><><><><><><><><><><><><><><><><> <><><><><><><><><><><><><><><><><><><><><><><><><><><><><><><><><>  -  Vitamin B12 =  228  - is  Very Low                        (Ideal or Goal Vit B12 is between 450 - 1,100)   Low Vit B12 may be associated with Anemia , Fatigue, Impotence,   Peripheral Neuropathy, Dementia, "Brain  Fog", & Depression    - Recommend take a sub-lingual form of Vitamin B12 tablet   1,000 to 5,000 mcg tab that you dissolve under your tongue /Daily   - Can get Lavonia Dana - best price at [employer] or on [employer]  <><><><><><><><><><><><><><><><><><><><><><><><><><><><><><><><><> <><><><><><><><><><><><><><><><><><><><><><><><><><><><><><><><><>  -  Total  Chol =  213     - Elevated             (  Ideal  or  Goal is less than 180  !  )  & -  Bad / Dangerous LDL  Chol =   105    - also Elevated              (  Ideal  or  Goal is less than 70  !  )   - Continue meds same  and   - Recommend  a STRICTER low cholesterol diet   - Cholesterol only comes from animal sources                                                                                  - ie. meat, dairy, egg yolks  - Eat all the vegetables you want.  - Avoid Meat, Avoid Meat,  Avoid Meat                                                                - especially Red Meat - Beef AND Pork .  - Avoid cheese & dairy - milk & ice cream.     - Cheese is the most concentrated form of trans-fats which                                                                is the worst thing to clog up our arteries.   - Veggie cheese is OK which can be found in the fresh                              produce section at [employer] or [employer] or [employer]  <><><><><><><><><><><><><><><><><><><><><><><><><><><><><><><><><> <><><><><><><><><><><><><><><><><><><><><><><><><><><><><><><><><>   - Also Triglycerides (   363   ) or fats in blood are too high                 (   Ideal or  Goal is less than 150  !  )    - Recommend avoid fried & greasy foods,  sweets / candy,   - Avoid white rice  (brown or wild rice or Quinoa is OK),   - Avoid white potatoes  (sweet potatoes are OK)   - Avoid anything made from white flour  - bagels, doughnuts, rolls, buns, biscuits, white and   wheat breads, pizza crust and traditional   pasta made of white flour & egg white  - (vegetarian pasta or spinach or wheat pasta is OK).    - Multi-grain bread is OK - like multi-grain flat bread or  sandwich thins.   - Avoid alcohol in excess.   - Exercise is also important.  <><><><><><><><><><><><><><><><><><><><><><><><><><><><><><><><><> <><><><><><><><><><><><><><><><><><><><><><><><><><><><><><><><><>  -  PSA -  Low  - Great   <><><><><><><><><><><><><><><><><><><><><><><><><><><><><><><><><> <><><><><><><><><><><><><><><><><><><><><><><><><><><><><><><><><>  -   Magnesium  =   1.7     -  is very  low- goal is betw 2.0 - 2.5,   - So..............Marland Kitchen  Recommend that you take  Magnesium 500 mg tablet x 2 tablets  / Daily  - also important to eat lots of  leafy green vegetables   - spinach - Kale - collards - greens - okra - asparagus  - broccoli - quinoa - squash - almonds   - black, red, white beans  -  peas - green beans  <><><><><><><><><><><><><><><><><><><><><><><><><><><><><><><><> <><><><><><><><><><><><><><><><><><><><><><><><><><><><><><><><>  -  A1c - Normal - No Diabetes  - Great  !  <><><><><><><><><><><><><><><><><><><><><><><><><><><><><><><><><> <><><><><><><><><><><><><><><><><><><><><><><><><><><><><><><><><>  -  Vitamin D = 65   Excellent  !      Please keep dose same   <><><><><><><><><><><><><><><><><><><><><><><><><><><><><><><><><> <><><><><><><><><><><><><><><><><><><><><><><><><><><><><><><><><>  -  All Else - CBC - Kidneys - Electrolytes - Liver - Magnesium & Thyroid    - all  Normal / OK <><><><><><><><><><><><><><><><><><><><><><><><><><><><><><><><><> <><><><><><><><><><><><><><><><><><><><><><><><><><><><><><><><><>

## 2021-09-21 ENCOUNTER — Other Ambulatory Visit: Payer: Self-pay | Admitting: Internal Medicine

## 2021-09-21 DIAGNOSIS — E782 Mixed hyperlipidemia: Secondary | ICD-10-CM

## 2021-10-06 ENCOUNTER — Other Ambulatory Visit: Payer: Self-pay | Admitting: Internal Medicine

## 2021-10-06 MED ORDER — ESCITALOPRAM OXALATE 20 MG PO TABS: ORAL_TABLET | ORAL | 3 refills | Status: DC

## 2021-10-24 ENCOUNTER — Ambulatory Visit (INDEPENDENT_AMBULATORY_CARE_PROVIDER_SITE_OTHER): Payer: BC Managed Care – PPO

## 2021-10-24 ENCOUNTER — Ambulatory Visit
Admission: EM | Admit: 2021-10-24 | Discharge: 2021-10-24 | Disposition: A | Discharge status: Home / Self Care | Payer: BC Managed Care – PPO | Attending: Family Medicine | Admitting: Family Medicine

## 2021-10-24 ENCOUNTER — Encounter: Payer: Self-pay | Admitting: Emergency Medicine

## 2021-10-24 DIAGNOSIS — M79672 Pain in left foot: Secondary | ICD-10-CM

## 2021-10-24 MED ORDER — CELECOXIB 100 MG PO CAPS: 100.0000 mg | ORAL_CAPSULE | Freq: Two times a day (BID) | ORAL | 0 refills | Status: AC

## 2021-10-24 MED ORDER — HYDROCODONE-ACETAMINOPHEN 5-325 MG PO TABS: 1.0000 | ORAL_TABLET | Freq: Four times a day (QID) | ORAL | 0 refills | Status: DC | PRN

## 2021-10-24 NOTE — Discharge Instructions (Addendum)
Advised patient of left foot x-ray with hard copy provided to patient.  Advised patient to take medication as directed with food to completion.  Encourage patient increase daily water intake while taking this medication.  Advised patient if left foot pain worsens and/or unresolved please follow-up with [facility] podiatrist for further evaluation-contact information is provided below.

## 2021-10-24 NOTE — ED Provider Notes (Signed)
[facility]    CSN: [id number] Arrival date & time: 10/24/21  1045      History   Chief Complaint Chief Complaint  Patient presents with   Foot Pain    HPI Jermaine Stewart is a 61 y.o. male.   HPI Pleasant 61 year old male presents with left foot pain on and off for 4 months.  Reports worsening pain over the past week and unable to put any weight or pressure on his left foot.  Patient denies injury or insult.  PMH is significant for spinal stenosis/radiculopathy, HTN, and history of gastric ulcer.  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   BMI 25.0-25.9,adult 10/12/2017   Other abnormal glucose 07/22/2013   Medication management 07/22/2013   Essential hypertension 03/12/2013   Vitamin D deficiency    Hyperlipidemia     Past Surgical History:  Procedure Laterality Date   HERNIA REPAIR Bilateral    15 years ago   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   Procedure: TOTAL KNEE ARTHROPLASTY;  Surgeon: Nestor Lewandowsky, MD;  Location: [facility] OR;  Service: Orthopedics;  Laterality: Left;   TOTAL KNEE ARTHROPLASTY Right 03/04/2014   Procedure: TOTAL KNEE ARTHROPLASTY;  Surgeon: Nestor Lewandowsky, MD;  Location: [facility] OR;  Service: Orthopedics;  Laterality: Right;   TOTAL KNEE ARTHROPLASTY Left 09/30/2018   Procedure: Revision Left Knee Arthroplasty;  Surgeon: Gean Birchwood, MD;  Location: [facility];  Service: Orthopedics;  Laterality: Left;   TOTAL KNEE REVISION  Right 07/07/2019   Procedure: RIGHT TOTAL KNEE REVISION;  Surgeon: Gean Birchwood, MD;  Location: [facility];  Service: Orthopedics;  Laterality: Right;   TRANSFORAMINAL LUMBAR INTERBODY FUSION (TLIF) WITH PEDICLE SCREW FIXATION 1 LEVEL Left 03/28/2018   Procedure: LEFT SIDED LUMBAR FOUR-FIVE TRANSFORAMINAL LUMBAR INTERBODY FUSION WITH INSTRUMENTATION AND ALLOGRAFT;  Surgeon: Estill Bamberg, MD;  Location: [facility] OR;  Service: Orthopedics;  Laterality: Left;   VASECTOMY         Home Medications    Prior to Admission medications   Medication Sig Start Date End Date Taking? Authorizing Provider  atenolol (TENORMIN) 100 MG tablet TAKE 1 TABLET AT BEDTIME FOR BLOOD PRESSURE 01/13/21  Yes Raynelle Dick, NP  buPROPion (WELLBUTRIN XL) 150 MG 24 hr tablet TAKE 1 TABLET EVERY MORNING FOR MOOD, FOCUS AND CONCENTRATION 01/13/21  Yes Raynelle Dick, NP  celecoxib (CELEBREX) 100 MG capsule Take 1 capsule (100 mg total) by mouth 2 (two) times daily for 15 days. 10/24/21 11/08/21 Yes Trevor Iha, FNP  escitalopram (LEXAPRO) 20 MG tablet Take 1 tablet  Daily  for Mood & Chronic Anxiety 10/06/21  Yes Lucky Cowboy, MD  ezetimibe (ZETIA) 10 MG tablet TAKE 1 TABLET DAILY FOR CHOLESTEROL 01/13/21  Yes Raynelle Dick, NP  fenofibrate (TRICOR) 145 MG tablet TAKE 1 TABLET DAILY FOR BLOOD FATS Patient taking differently: Take 145 mg by mouth daily. Take 1 tablet daily for Blood Fats 08/29/18  Yes Lucky Cowboy,  MD  HYDROcodone-acetaminophen (NORCO/VICODIN) 5-325 MG tablet Take 1 tablet by mouth every 6 (six) hours as needed. 10/24/21  Yes Trevor Iha, FNP  meloxicam (MOBIC) 15 MG tablet Take  1 tablet   Daily  with food for Pain & Inflammation 09/08/21  Yes Lucky Cowboy, MD  omeprazole (PRILOSEC) 40 MG capsule TAKE 1 CAPSULE TWICE A DAY FOR ACID INDIGESTION AND REFLUX 01/13/21  Yes Raynelle Dick, NP  rosuvastatin (CRESTOR) 20 MG tablet Take  1 tablet  every other day  on even days of month 09/21/21  Yes  Lucky Cowboy, MD  tiZANidine (ZANAFLEX) 4 MG tablet Take  1 tablet   3 x /day as needed for Muscle Spasn 07/07/20  Yes Lucky Cowboy, MD  valsartan (DIOVAN) 160 MG tablet Take  1 tablet  Daily  for BP 07/07/20  Yes Lucky Cowboy, MD    Family History Family History  Problem Relation Age of Onset   Breast cancer Mother    Throat cancer Father    Hypertension Brother    Stroke Brother    Hypertension Brother    Hypertension Brother    Colon cancer Neg Hx    Stomach cancer Neg Hx    Rectal cancer Neg Hx     Social History Social History   Tobacco Use   Smoking status: Never   Smokeless tobacco: Former    Types: Associate Professor Use: Never used  Substance Use Topics   Alcohol use: Yes    Alcohol/week: 14.0 standard drinks of alcohol    Types: 14 Cans of beer per week    Comment: 2-3 beer a day   Drug use: No     Allergies   Atorvastatin and Percocet [oxycodone-acetaminophen]   Review of Systems Review of Systems  Musculoskeletal:        Left foot pain x4 months     Physical Exam Triage Vital Signs ED Triage Vitals  Enc Vitals Group     BP 10/24/21 1113 112/73     Pulse Rate 10/24/21 1113 74     Resp 10/24/21 1113 18     Temp 10/24/21 1113 98.8 F (37.1 C)     Temp Source 10/24/21 1113 Oral     SpO2 10/24/21 1113 96 %     Weight 10/24/21 1115 168 lb (76.2 kg)     Height 10/24/21 1115 5\' 10"  (1.778 m)     Head Circumference --      Peak Flow --      Pain Score 10/24/21 1114 8     Pain Loc --      Pain Edu? --      Excl. in GC? --    No data found.  Updated Vital Signs BP 112/73 (BP Location: Right Arm)   Pulse 74   Temp 98.8 F (37.1 C) (Oral)   Resp 18   Ht 5\' 10"  (1.778 m)   Wt 168 lb (76.2 kg)   SpO2 96%   BMI 24.11 kg/m    Physical Exam Vitals and nursing note reviewed.  Constitutional:      General: He is not in acute distress.    Appearance: Normal appearance. He is normal weight. He is not ill-appearing.  HENT:      Head: Normocephalic and atraumatic.     Nose: Nose normal.     Mouth/Throat:     Mouth: Mucous membranes are moist.     Pharynx: Oropharynx is clear.  Eyes:  Extraocular Movements: Extraocular movements intact.     Conjunctiva/sclera: Conjunctivae normal.     Pupils: Pupils are equal, round, and reactive to light.  Cardiovascular:     Rate and Rhythm: Normal rate and regular rhythm.     Pulses: Normal pulses.     Heart sounds: Normal heart sounds. No murmur heard. Pulmonary:     Effort: Pulmonary effort is normal.     Breath sounds: Normal breath sounds. No wheezing, rhonchi or rales.  Musculoskeletal:     Cervical back: Normal range of motion and neck supple.     Comments: Right foot (dorsum): Erythematous, moderate soft tissue swelling TTP over first MTP, limited range of motion with moderate to severe pain reported by patient  Skin:    General: Skin is warm and dry.  Neurological:     General: No focal deficit present.     Mental Status: He is alert and oriented to person, place, and time.      UC Treatments / Results  Labs (all labs ordered are listed, but only abnormal results are displayed) Labs Reviewed - No data to display  EKG   Radiology DG Foot Complete Left  Result Date: 10/24/2021 CLINICAL DATA:  Left foot pain for 4 months.  No known injury. EXAM: LEFT FOOT - COMPLETE 3+ VIEW COMPARISON:  None Available. FINDINGS: There is no evidence of fracture or dislocation. Severe osteoarthritis is seen involving 1st MTP joint, with geode formation and mild medial subluxation. Mild osteoarthritis is seen involving the 1st tarsal-metatarsal joint. Plantar calcaneal bone spur also noted. IMPRESSION: No acute findings. 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 Electronically Signed   By: Danae Orleans M.D.   On: 10/24/2021 12:26    Procedures Procedures (including critical care time)  Medications Ordered in UC Medications - No  data to display  Initial Impression / Assessment and Plan / UC Course  I have reviewed the triage vital signs and the nursing notes.  Pertinent labs & imaging results that were available during my care of the patient were reviewed by me and considered in my medical decision making (see chart for details).     MDM: 1.  Left foot pain-advised patient of left foot x-ray with hard copy provided to patient.  Rx'd Celebrex, Vicodin.  Advised patient to take medication as directed with food to completion.  Encourage patient increase daily water intake while taking this medication.  Advised patient if left foot pain worsens and/or unresolved please follow-up with [facility] podiatrist for further evaluation-contact information is provided below. Final Clinical Impressions(s) / UC Diagnoses   Final diagnoses:  Foot pain, left     Discharge Instructions      Advised patient of left foot x-ray with hard copy provided to patient.  Advised patient to take medication as directed with food to completion.  Encourage patient increase daily water intake while taking this medication.  Advised patient if left foot pain worsens and/or unresolved please follow-up with [facility] podiatrist for further evaluation-contact information is provided below.     ED Prescriptions     Medication Sig Dispense Auth. Provider   celecoxib (CELEBREX) 100 MG capsule Take 1 capsule (100 mg total) by mouth 2 (two) times daily for 15 days. 30 capsule Trevor Iha, FNP   HYDROcodone-acetaminophen (NORCO/VICODIN) 5-325 MG tablet Take 1 tablet by mouth every 6 (six) hours as needed. 24 tablet Trevor Iha, FNP      I have reviewed the PDMP  during this encounter.   Trevor Iha, FNP 10/24/21 1317

## 2021-10-24 NOTE — ED Triage Notes (Signed)
Patient c/o left foot pain x 4 months on and off.  Over the past weekend, he was unable to put any weight or pressure on his left foot pain.  No apparent injury.  Patient has taken Advil for the pain.

## 2021-10-25 ENCOUNTER — Telehealth: Payer: Self-pay

## 2021-10-25 NOTE — Telephone Encounter (Signed)
TCT pt to follow up from recent visit. HIPAA compliant VM left for return call.  

## 2021-10-28 ENCOUNTER — Ambulatory Visit (INDEPENDENT_AMBULATORY_CARE_PROVIDER_SITE_OTHER): Payer: BC Managed Care – PPO | Admitting: Podiatry

## 2021-10-28 ENCOUNTER — Encounter: Payer: Self-pay | Admitting: Podiatry

## 2021-10-28 DIAGNOSIS — M5416 Radiculopathy, lumbar region: Secondary | ICD-10-CM

## 2021-10-28 DIAGNOSIS — M205X2 Other deformities of toe(s) (acquired), left foot: Secondary | ICD-10-CM

## 2021-10-28 MED ORDER — GABAPENTIN 300 MG PO CAPS: 300.0000 mg | ORAL_CAPSULE | Freq: Every day | ORAL | 3 refills | Status: DC

## 2021-10-28 NOTE — Progress Notes (Addendum)
  Subjective:  Patient ID: Jermaine Stewart, male    DOB: May 01, 1960,   MRN: [id number]  Chief Complaint  Patient presents with   Arthritis      (np) L great toe, arthritis, follow up urgent care visit, xrays done on 10/24/2021    61 y.o. male presents for concern of left great toe joint pain that has been going on for about four months. He was recently seen in urgent care for this. X-rays taken and given some pain medication.  Relates burning in the foot mostly and feels mostly when walking on it but does get some pain at night. He does have a history of back surgery on L4 L5 and has had gabapentin in the past that has helped his back. No current back pain.  Denies any other pedal complaints. Denies n/v/f/c.   Past Medical History:  Diagnosis Date   Arthritis    Depression    GERD (gastroesophageal reflux disease)    Hyperlipidemia    Hypertension     Objective:  Physical Exam: Vascular: DP/PT pulses 2/4 bilateral. CFT <3 seconds. Normal hair growth on digits. No edema.  Skin. No lacerations or abrasions bilateral feet.  Musculoskeletal: MMT 5/5 bilateral lower extremities in DF, PF, Inversion and Eversion. Deceased ROM in DF of ankle joint. No tenderness to palpation of the foot currently. No pain with ROM of the first MPJ although limited. No erythema or edema noted.  Neurological: Sensation intact to light touch.   Assessment:   1. Lumbar radiculopathy   2. Hallux limitus of left foot      Plan:  Patient was evaluated and treated and all questions answered. -Xrays reviewed. No acute fractures or dislocations. There is severe degenerative changes noted at the left first MPJ.  -Reviewed urgent care notes -Discussed hallux limitus vs more radiculopathy pain and  treatement options; conservative and  Surgical management; risks, benefits, alternatives discussed. All patient's questions answered. -Continue with celebrex.  -Recommend continue with good supportive shoes and  inserts. Discussed stiff soled shoes and  reducing motion at great toe joint.  -Discussed possible some of this pain maybe coming from back.  -Will try 3 months of gabapentin 300 mg nightly to see if this helps calm down pain for him. Discussed if this is helpful further imaging and testing of nerves maybe necessary and follow-up with back surgeon.  -Patient to return to office as needed or sooner if condition worsens.    Louann Sjogren, DPM

## 2021-11-15 ENCOUNTER — Other Ambulatory Visit: Payer: Self-pay | Admitting: Internal Medicine

## 2021-11-15 DIAGNOSIS — G8929 Other chronic pain: Secondary | ICD-10-CM

## 2021-11-15 MED ORDER — TIZANIDINE HCL 4 MG PO TABS: ORAL_TABLET | ORAL | 1 refills | Status: DC

## 2021-11-30 ENCOUNTER — Ambulatory Visit: Payer: BC Managed Care – PPO | Admitting: Podiatry

## 2021-12-01 ENCOUNTER — Encounter: Payer: Self-pay | Admitting: Podiatry

## 2021-12-01 ENCOUNTER — Ambulatory Visit (INDEPENDENT_AMBULATORY_CARE_PROVIDER_SITE_OTHER): Payer: BC Managed Care – PPO | Admitting: Podiatry

## 2021-12-01 DIAGNOSIS — M7752 Other enthesopathy of left foot: Secondary | ICD-10-CM | POA: Diagnosis not present

## 2021-12-01 MED ORDER — TRIAMCINOLONE ACETONIDE 10 MG/ML IJ SUSP
10.0000 mg | Freq: Once | INTRAMUSCULAR | Status: AC
  Administered 2021-12-01: 10 mg

## 2021-12-04 NOTE — Progress Notes (Signed)
Subjective:   Patient ID: Jermaine Stewart, male   DOB: 61 y.o.   MRN: [id number]   HPI Patient presents with severe pain in the left ankle stating that it has gotten worse recently and hard to walk with neuro   ROS      Objective:  Physical Exam  Vascular status intact pain found in the left sinus tarsi with fluid buildup around the joint surface     Assessment:  Sinus tarsitis left with inflammation fluid buildup     Plan:  Reviewed condition sterile prep injected the sinus tarsi left 3 mg Kenalog 5 mg Xylocaine advised on immobilization reappoint to recheck in 2 weeks and will decide what else might be appropriate

## 2021-12-13 ENCOUNTER — Other Ambulatory Visit: Payer: Self-pay | Admitting: Internal Medicine

## 2021-12-13 DIAGNOSIS — I10 Essential (primary) hypertension: Secondary | ICD-10-CM

## 2021-12-13 MED ORDER — VALSARTAN 160 MG PO TABS: ORAL_TABLET | ORAL | 0 refills | Status: DC

## 2021-12-15 ENCOUNTER — Encounter: Payer: Self-pay | Admitting: Nurse Practitioner

## 2021-12-15 ENCOUNTER — Ambulatory Visit (INDEPENDENT_AMBULATORY_CARE_PROVIDER_SITE_OTHER): Payer: BC Managed Care – PPO | Admitting: Nurse Practitioner

## 2021-12-15 ENCOUNTER — Ambulatory Visit (INDEPENDENT_AMBULATORY_CARE_PROVIDER_SITE_OTHER): Payer: BC Managed Care – PPO | Admitting: Podiatry

## 2021-12-15 VITALS — BP 108/64 | HR 65 | Temp 97.5°F | Ht 69.0 in | Wt 170.0 lb

## 2021-12-15 DIAGNOSIS — E782 Mixed hyperlipidemia: Secondary | ICD-10-CM

## 2021-12-15 DIAGNOSIS — K219 Gastro-esophageal reflux disease without esophagitis: Secondary | ICD-10-CM | POA: Diagnosis not present

## 2021-12-15 DIAGNOSIS — Z23 Encounter for immunization: Secondary | ICD-10-CM

## 2021-12-15 DIAGNOSIS — I10 Essential (primary) hypertension: Secondary | ICD-10-CM

## 2021-12-15 DIAGNOSIS — M779 Enthesopathy, unspecified: Secondary | ICD-10-CM

## 2021-12-15 DIAGNOSIS — Z79899 Other long term (current) drug therapy: Secondary | ICD-10-CM | POA: Diagnosis not present

## 2021-12-15 DIAGNOSIS — M7752 Other enthesopathy of left foot: Secondary | ICD-10-CM

## 2021-12-15 MED ORDER — TRIAMCINOLONE ACETONIDE 10 MG/ML IJ SUSP
10.0000 mg | Freq: Once | INTRAMUSCULAR | Status: AC
  Administered 2021-12-15: 10 mg

## 2021-12-15 MED ORDER — VALSARTAN 160 MG PO TABS: ORAL_TABLET | ORAL | 0 refills | Status: DC

## 2021-12-15 NOTE — Progress Notes (Signed)
FOLLOW UP  Assessment and Plan:   Essential hypertension Discussed DASH (Dietary Approaches to Stop Hypertension) DASH diet is lower in sodium than a typical American diet. Cut back on foods that are high in saturated fat, cholesterol, and trans fats. Eat more whole-grain foods, fish, poultry, and nuts Remain active and exercise as tolerated daily.  Monitor BP at home-Call if greater than 130/80.   Need for immunization against influenza Administered Patient tolerated well  - Flu Vaccine QUAD 36+ mos IM (Fluarix, Fluzone & Afluria Quad PF  Gastroesophageal reflux disease, unspecified whether esophagitis present No suspected reflux complications (Barret/stricture). Lifestyle modification:  wt loss, avoid meals 2-3h before bedtime. Consider eliminating food triggers:  chocolate, caffeine, EtOH, acid/spicy food.   Mixed hyperlipidemia Discussed lifestyle modifications. Recommended diet heavy in fruits and veggies, omega 3's. Decrease consumption of animal meats, cheeses, and dairy products. Remain active and exercise as tolerated. Continue to monitor.  Medication management All medications discussed and reviewed in full. All questions and concerns regarding medications addressed.    Notify office for further evaluation and treatment, questions or concerns if any reported s/s fail to improve.   The patient was advised to call back or seek an in-person evaluation if any symptoms worsen or if the condition fails to improve as anticipated.   Further disposition pending results of labs. Discussed med's effects and SE's.    I discussed the assessment and treatment plan with the patient. The patient was provided an opportunity to ask questions and all were answered. The patient agreed with the plan and demonstrated an understanding of the instructions.  Discussed med's effects and SE's. Screening labs and tests as requested with regular follow-up as recommended.  I provided 20  minutes of face-to-face time during this encounter including counseling, chart review, and critical decision making was preformed.   Future Appointments  Date Time Provider [place]  01/05/2022 10:45 AM Wallene Huh, DPM [facility]  03/22/2022  4:00 PM Unk Pinto, MD [facility] None  09/13/2022  3:00 PM Unk Pinto, MD [facility] None    ----------------------------------------------------------------------------------------------------------------------  HPI 60 y.o. male  presents for 3 month follow up on hypertension, cholesterol, diabetes, weight and vitamin D deficiency.   BMI is Body mass index is 25.1 kg/m., he has been working on diet and exercise. Wt Readings from Last 3 Encounters:  12/15/21 170 lb (77.1 kg)  10/24/21 168 lb (76.2 kg)  09/08/21 169 lb (76.7 kg)    His blood pressure has been controlled at home, today their BP is BP: 108/64  He does workout. He denies chest pain, shortness of breath, dizziness.   He is not on cholesterol medication Rosuvastatin and denies myalgias. His cholesterol is not at goal. The cholesterol last visit was:   Lab Results  Component Value Date   CHOL 213 (H) 09/08/2021   HDL 60 09/08/2021   LDLCALC 105 (H) 09/08/2021   TRIG 363 (H) 09/08/2021   CHOLHDL 3.6 09/08/2021    He has been working on diet and exercise for prediabetes, and denies polydipsia and polyuria. Last A1C in the office was:  Lab Results  Component Value Date   HGBA1C 4.8 09/08/2021   Patient is on Vitamin D supplement.   Lab Results  Component Value Date   VD25OH 51 09/08/2021        Current Medications:  Current Outpatient Medications on File Prior to Visit  Medication Sig   atenolol (TENORMIN) 100 MG tablet TAKE 1 TABLET AT BEDTIME FOR BLOOD  PRESSURE   buPROPion (WELLBUTRIN XL) 150 MG 24 hr tablet TAKE 1 TABLET EVERY MORNING FOR MOOD, FOCUS AND CONCENTRATION   ezetimibe (ZETIA) 10 MG tablet TAKE 1 TABLET DAILY FOR  CHOLESTEROL   fenofibrate (TRICOR) 145 MG tablet TAKE 1 TABLET DAILY FOR BLOOD FATS (Patient taking differently: Take 145 mg by mouth daily. Take 1 tablet daily for Blood Fats)   meloxicam (MOBIC) 15 MG tablet Take  1 tablet   Daily  with food for Pain & Inflammation   omeprazole (PRILOSEC) 40 MG capsule TAKE 1 CAPSULE TWICE A DAY FOR ACID INDIGESTION AND REFLUX   rosuvastatin (CRESTOR) 20 MG tablet Take  1 tablet  every other day  on even days of month   escitalopram (LEXAPRO) 20 MG tablet Take 1 tablet  Daily  for Mood & Chronic Anxiety   gabapentin (NEURONTIN) 300 MG capsule Take 1 capsule (300 mg total) by mouth at bedtime.   HYDROcodone-acetaminophen (NORCO/VICODIN) 5-325 MG tablet Take 1 tablet by mouth every 6 (six) hours as needed.   tiZANidine (ZANAFLEX) 4 MG tablet Take  1 tablet   3 x /day as needed for Muscle Spasn   No current facility-administered medications on file prior to visit.     Allergies:  Allergies  Allergen Reactions   Atorvastatin Other (See Comments)    Arthralgias   Percocet [Oxycodone-Acetaminophen] Other (See Comments)    Hallucinates     Medical History:  Past Medical History:  Diagnosis Date   Arthritis    Depression    GERD (gastroesophageal reflux disease)    Hyperlipidemia    Hypertension    Family history- Reviewed and unchanged Social history- Reviewed and unchanged   Review of Systems:  ROS    Physical Exam: BP 108/64   Pulse 65   Temp (!) 97.5 F (36.4 C)   Ht 5\' 9"  (1.753 m)   Wt 170 lb (77.1 kg)   SpO2 99%   BMI 25.10 kg/m  Wt Readings from Last 3 Encounters:  12/15/21 170 lb (77.1 kg)  10/24/21 168 lb (76.2 kg)  09/08/21 169 lb (76.7 kg)   General Appearance: Well nourished, in no apparent distress. Eyes: PERRLA, EOMs, conjunctiva no swelling or erythema Sinuses: No Frontal/maxillary tenderness ENT/Mouth: Ext aud canals clear, TMs without erythema, bulging. No erythema, swelling, or exudate on post pharynx.  Tonsils  not swollen or erythematous. Hearing normal.  Neck: Supple, thyroid normal.  Respiratory: Respiratory effort normal, BS equal bilaterally without rales, rhonchi, wheezing or stridor.  Cardio: RRR with no MRGs. Brisk peripheral pulses without edema.  Abdomen: Soft, + BS.  Non tender, no guarding, rebound, hernias, masses. Lymphatics: Non tender without lymphadenopathy.  Musculoskeletal: Full ROM, 5/5 strength, Normal gait Skin: Warm, dry without rashes, lesions, ecchymosis.  Neuro: Cranial nerves intact. No cerebellar symptoms.  Psych: Awake and oriented X 3, normal affect, Insight and Judgment appropriate.    09/10/21, NP 10:25 PM [facility]

## 2021-12-16 NOTE — Progress Notes (Signed)
Subjective:   Patient ID: Jermaine Stewart, male   DOB: 61 y.o.   MRN: [id number]   HPI Patient presents stating that he had relief for short period of time and now has had reoccurrence but it seems to be in a different place   ROS      Objective:  Physical Exam  Neurovascular status intact with improvement around the sinus tarsi left with patient found to have lateral foot inflammation left that is localized to this area mildly painful and moderately painful when pressed     Assessment:  Appears to be more within the peroneal tertius group with significant improvement currently in the sinus tarsi     Plan:  Reviewed that this is a different position and I am get a focus on this and I did do sterile prep and I injected around this area 3 mg Kenalog 5 mg Xylocaine to reduce the inflammatory process.  I did discuss ice therapy stretching exercises supportive shoes reappoint as needed

## 2021-12-19 IMAGING — CR DG CHEST 2V
2 series · 2 of 2 positions shown · non-contrast
Comparison: 09/25/2018.

CLINICAL DATA: Preop for knee surgery.

EXAM:
CHEST - 2 VIEW

[w chest pa]
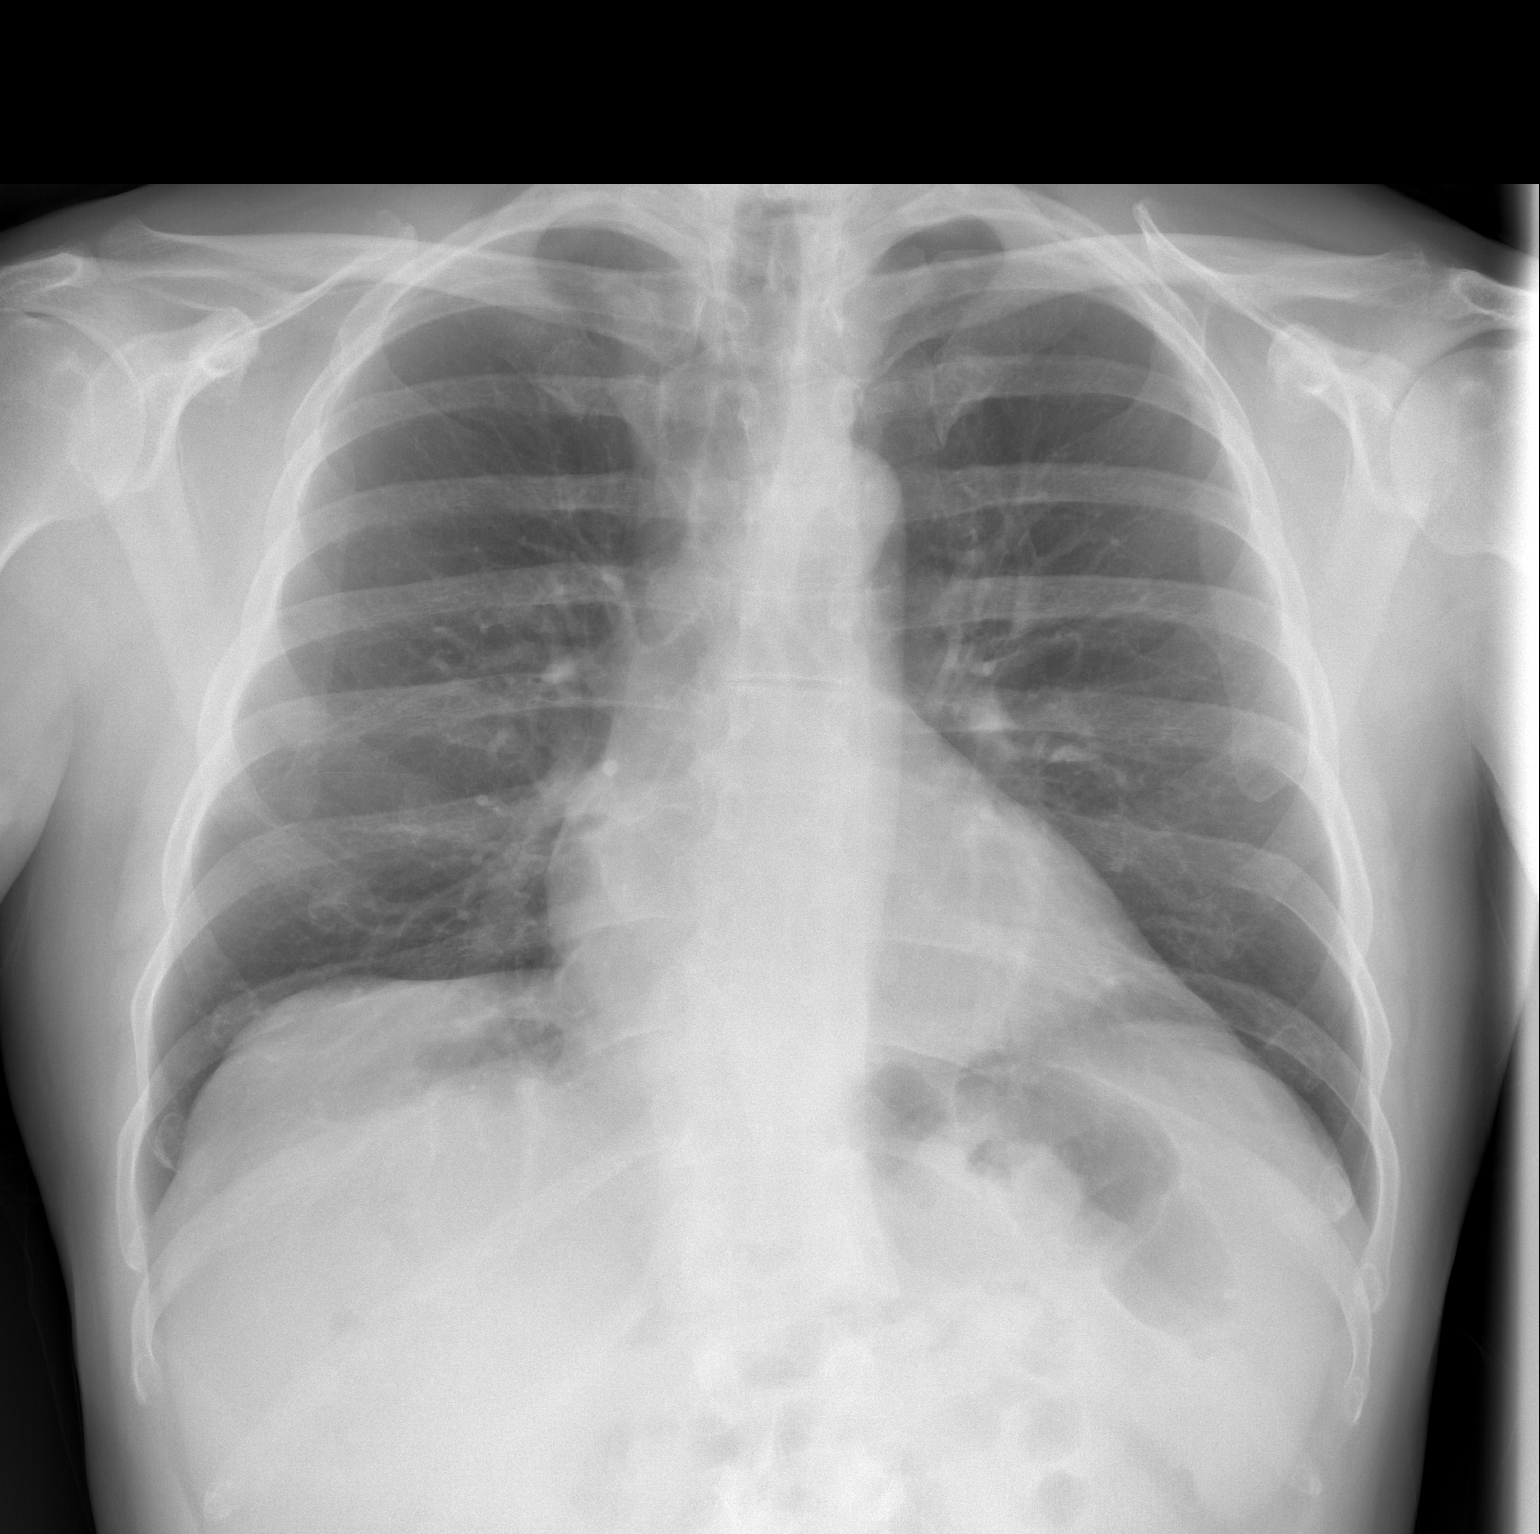

[w chest lat]
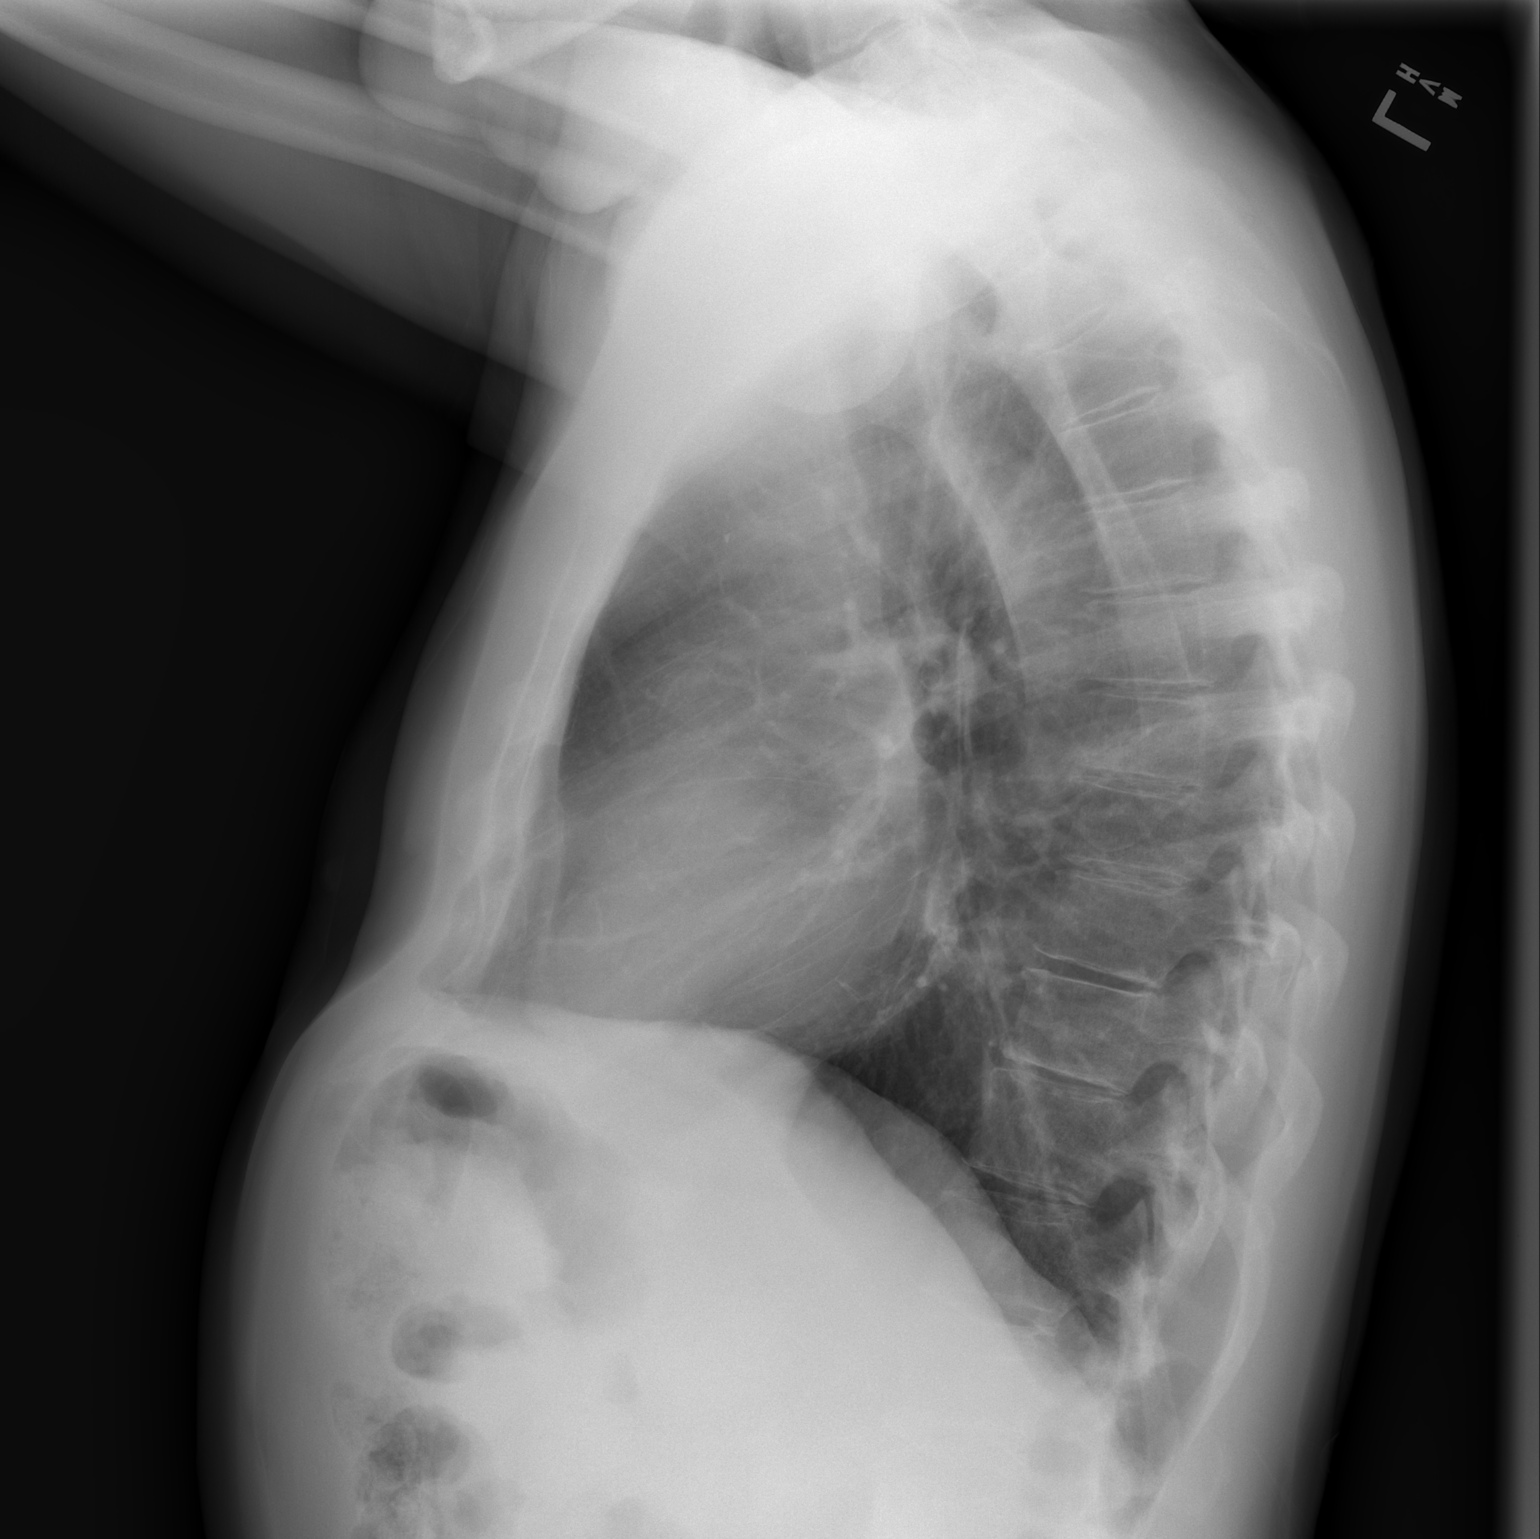

[2 of 2 positions shown; findings below may reference images not displayed]

FINDINGS: The heart size and mediastinal contours are within normal limits.
Both lungs are clear. The visualized skeletal structures are
unremarkable.
IMPRESSION: No active cardiopulmonary disease.  Exam stable from prior study.

## 2021-12-20 ENCOUNTER — Other Ambulatory Visit: Payer: Self-pay

## 2021-12-20 DIAGNOSIS — G8929 Other chronic pain: Secondary | ICD-10-CM

## 2021-12-20 MED ORDER — MELOXICAM 15 MG PO TABS: ORAL_TABLET | ORAL | 3 refills | Status: DC

## 2022-01-03 ENCOUNTER — Telehealth: Payer: BC Managed Care – PPO | Admitting: Nurse Practitioner

## 2022-01-03 DIAGNOSIS — R051 Acute cough: Secondary | ICD-10-CM | POA: Diagnosis not present

## 2022-01-03 MED ORDER — BENZONATATE 100 MG PO CAPS: 100.0000 mg | ORAL_CAPSULE | Freq: Three times a day (TID) | ORAL | 0 refills | Status: DC | PRN

## 2022-01-03 MED ORDER — PREDNISONE 20 MG PO TABS: 20.0000 mg | ORAL_TABLET | Freq: Every day | ORAL | 0 refills | Status: AC

## 2022-01-03 MED ORDER — PREDNISONE 20 MG PO TABS: 20.0000 mg | ORAL_TABLET | Freq: Every day | ORAL | 0 refills | Status: DC

## 2022-01-03 NOTE — Progress Notes (Signed)
We are sorry that you are not feeling well.  Here is how we plan to help!  Based on your presentation I believe you most likely have A cough due to a virus.  This is called viral bronchitis and is best treated by rest, plenty of fluids and control of the cough.  You may use Ibuprofen or Tylenol as directed to help your symptoms.     In addition you may use A prescription cough medication called Tessalon Perles 100mg . You may take 1-2 capsules every 8 hours as needed for your cough.  Prednisone 20 mg daily for 5 days (take with food)  From your responses in the eVisit questionnaire you describe inflammation in the upper respiratory tract which is causing a significant cough.  This is commonly called Bronchitis and has four common causes:   Allergies Viral Infections Acid Reflux Bacterial Infection Allergies, viruses and acid reflux are treated by controlling symptoms or eliminating the cause. An example might be a cough caused by taking certain blood pressure medications. You stop the cough by changing the medication. Another example might be a cough caused by acid reflux. Controlling the reflux helps control the cough.  USE OF BRONCHODILATOR ("RESCUE") INHALERS: There is a risk from using your bronchodilator too frequently.  The risk is that over-reliance on a medication which only relaxes the muscles surrounding the breathing tubes can reduce the effectiveness of medications prescribed to reduce swelling and congestion of the tubes themselves.  Although you feel brief relief from the bronchodilator inhaler, your asthma may actually be worsening with the tubes becoming more swollen and filled with mucus.  This can delay other crucial treatments, such as oral steroid medications. If you need to use a bronchodilator inhaler daily, several times per day, you should discuss this with your provider.  There are probably better treatments that could be used to keep your asthma under control.     HOME  CARE Only take medications as instructed by your medical team. Complete the entire course of an antibiotic. Drink plenty of fluids and get plenty of rest. Avoid close contacts especially the very young and the elderly Cover your mouth if you cough or cough into your sleeve. Always remember to wash your hands A steam or ultrasonic humidifier can help congestion.   GET HELP RIGHT AWAY IF: You develop worsening fever. You become short of breath You cough up blood. Your symptoms persist after you have completed your treatment plan MAKE SURE YOU  Understand these instructions. Will watch your condition. Will get help right away if you are not doing well or get worse.    Thank you for choosing an e-visit.  Your e-visit answers were reviewed by a board certified advanced clinical practitioner to complete your personal care plan. Depending upon the condition, your plan could have included both over the counter or prescription medications.  Please review your pharmacy choice. Make sure the pharmacy is open so you can pick up prescription now. If there is a problem, you may contact your provider through and have the prescription routed to another pharmacy.  Your safety is important to [employer]. If you have drug allergies check your prescription carefully.   For the next 24 hours you can use MyChart to ask questions about today's visit, request a non-urgent call back, or ask for a work or school excuse. You will get an email in the next two days asking about your experience. I hope that your e-visit has been valuable  and will speed your recovery.   Meds ordered this encounter  Medications   benzonatate (TESSALON) 100 MG capsule    Sig: Take 1 capsule (100 mg total) by mouth 3 (three) times daily as needed.    Dispense:  30 capsule    Refill:  0   predniSONE (DELTASONE) 20 MG tablet    Sig: Take 1 tablet (20 mg total) by mouth daily with breakfast for 5 days.    Dispense:  5  tablet    Refill:  0     I spent approximately 5 minutes reviewing the patient's history, current symptoms and coordinating their plan of care today.

## 2022-01-03 NOTE — Addendum Note (Signed)
Addended by: Viviano Simas E on: 01/03/2022 07:11 PM   Modules accepted: Orders

## 2022-01-05 ENCOUNTER — Ambulatory Visit (INDEPENDENT_AMBULATORY_CARE_PROVIDER_SITE_OTHER): Payer: BC Managed Care – PPO | Admitting: Podiatry

## 2022-01-05 DIAGNOSIS — M19072 Primary osteoarthritis, left ankle and foot: Secondary | ICD-10-CM | POA: Diagnosis not present

## 2022-01-05 DIAGNOSIS — T148XXA Other injury of unspecified body region, initial encounter: Secondary | ICD-10-CM

## 2022-01-05 NOTE — Progress Notes (Signed)
Subjective:   Patient ID: Jermaine Stewart, male   DOB: 61 y.o.   MRN: [id number]   HPI Patient states he still having a lot of pain in his left ankle and it is somewhat better than it had been but is still very sore and its just not improved over the last few months   ROS      Objective:  Physical Exam  Neurovascular status intact with exquisite discomfort sinus tarsi and moderate discomfort into the lateral ankle ligament and into the peroneal tendon with also some proximal pain around the lateral malleolus.  All areas are giving him trouble     Assessment:  Difficult to make complete determination as to what is going on but may be some type of tendon injury or possible subchondral injury or other pathology     Plan:  Reviewed at great length this point he has had 2 injections with minimal relief of symptoms and I do want to go ahead get an MRI with the consideration long-term for immobilization physical therapy or possibly could require surgery if we find there is something that shows up on the MRI.  Patient scheduled for this to be done of his left ankle and afterwards we will make determination as to the best long-term course

## 2022-02-23 ENCOUNTER — Other Ambulatory Visit: Payer: Self-pay | Admitting: Nurse Practitioner

## 2022-03-13 ENCOUNTER — Other Ambulatory Visit: Payer: Self-pay | Admitting: Internal Medicine

## 2022-03-13 DIAGNOSIS — I10 Essential (primary) hypertension: Secondary | ICD-10-CM

## 2022-03-22 ENCOUNTER — Encounter: Payer: Self-pay | Admitting: Internal Medicine

## 2022-03-22 ENCOUNTER — Ambulatory Visit: Payer: BC Managed Care – PPO | Admitting: Internal Medicine

## 2022-03-22 VITALS — BP 118/62 | HR 72 | Temp 97.9°F | Resp 16 | Ht 68.0 in | Wt 174.0 lb

## 2022-03-22 DIAGNOSIS — E782 Mixed hyperlipidemia: Secondary | ICD-10-CM

## 2022-03-22 DIAGNOSIS — E559 Vitamin D deficiency, unspecified: Secondary | ICD-10-CM | POA: Diagnosis not present

## 2022-03-22 DIAGNOSIS — Z79899 Other long term (current) drug therapy: Secondary | ICD-10-CM

## 2022-03-22 DIAGNOSIS — I10 Essential (primary) hypertension: Secondary | ICD-10-CM

## 2022-03-22 DIAGNOSIS — R7309 Other abnormal glucose: Secondary | ICD-10-CM

## 2022-03-22 NOTE — Patient Instructions (Signed)

## 2022-03-22 NOTE — Progress Notes (Signed)
Future Appointments  Date Time Provider Department  03/22/2022  4:00 PM Unk Pinto, MD [facility]  04/14/2022  1:15 PM Dillingham, Loel Lofty, DO [facility]  09/13/2022  3:00 PM Unk Pinto, MD [facility]      History of Present Illness:                                                         This very nice 62 y.o. MWM presents for 6 month follow up with HTN, HLD, Pre-Diabetes and Vitamin D Deficiency.                                                          Patient is treated for HTN since  2000  & BP has been controlled at home. Today's BP is at goal - 118/62 .  Marland Kitchen Patient has had no complaints of any cardiac type chest pain, palpitations, dyspnea / orthopnea / PND, dizziness, claudication, or dependent edema.                                                         Hyperlipidemia is controlled with diet & meds. Patient denies myalgias or other med SE's. Last Lipids were        Lab Results  Component Value Date    CHOL 148 08/19/2020    HDL 77 08/19/2020    LDLCALC 55 08/19/2020    TRIG 78 08/19/2020    CHOLHDL 1.9 08/19/2020        Also, the patient is followed  expectantly for glucose intolerance and has had no symptoms of reactive hypoglycemia, diabetic polys, paresthesias or visual blurring.  Last A1c was normal and at goal :        Lab Results  Component Value Date    HGBA1C 5.0 08/19/2020                                                                                               Further, the patient also has history of Vitamin D Deficiency ("25" /2014) and supplements vitamin D without any suspected side-effects. Last vitamin D was at goal :          Lab Results  Component Value Date    VD25OH 69 08/19/2020    Current Outpatient Medications on File Prior to Visit  Medication Sig   atenolol (TENORMIN) 100 MG tablet TAKE 1 TABLET AT BEDTIME FOR BLOOD PRESSURE   buPROPion (WELLBUTRIN XL) 150 MG 24 hr tablet TAKE 1 TABLET EVERY MORNING FOR MOOD, FOCUS  AND CONCENTRATION   escitalopram (LEXAPRO) 20  MG tablet Take 1 tablet  Daily  for Mood & Chronic Anxiety / Patient knows to take by mouth   ezetimibe (ZETIA) 10 MG tablet TAKE 1 TABLET DAILY FOR CHOLESTEROL   fenofibrate (TRICOR) 145 MG tablet TAKE 1 TABLET DAILY FOR BLOOD FATS (Patient taking differently: Take 145 mg by mouth daily. Take 1 tablet daily for Blood Fats)   meloxicam (MOBIC) 15 MG tablet TAKE ONE-HALF (1/2) TO ONE TABLET DAILY WITH FOOD FOR PAIN AND INFLAMMATION   omeprazole (PRILOSEC) 40 MG capsule TAKE 1 CAPSULE TWICE A DAY FOR ACID INDIGESTION AND REFLUX   rosuvastatin (CRESTOR) 20 MG tablet TAKE 1 TABLET TWICE A WEEK FOR CHOLESTEROL   tiZANidine (ZANAFLEX) 4 MG tablet Take  1 tablet   3 x /day as needed for Muscle Spasn (Patient not taking: Reported on 08/19/2020)   traZODone (DESYREL) 150 MG tablet TAKE 1/2 TO 1 TABLET BY MOUTH EVERY NIGHT 1 HOUR BEFORE BEDTIME AS NEEDED FOR SLEEP (Patient not taking: Reported on 08/19/2020)   valsartan (DIOVAN) 160 MG tablet Take  1 tablet  Daily  for BP    Allergies  Allergen Reactions   Atorvastatin Other (See Comments)    Arthralgias   Percocet [Oxycodone-Acetaminophen] Other (See Comments)    Hallucinates    PMHx:   Past Medical History:  Diagnosis Date   Arthritis    Depression    GERD (gastroesophageal reflux disease)    Hyperlipidemia    Hypertension      Immunization History  Administered Date(s) Administered   Influenza Inj Mdck Quad  11/29/2017   Influenza,inj,Quad PF 01/31/2019   Influenza,inj,quad 12/21/2015   Influenza-Unspecified 11/01/2016   PFIZER SARS-COV-2 Vacc 04/27/2019, 05/28/2019   PPD Test 07/31/2018, 08/19/2019, 08/20/2020   Tdap 07/22/2013     Past Surgical History:  Procedure Laterality Date   HERNIA REPAIR Bilateral    15 years ago   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   Procedure: TOTAL KNEE ARTHROPLASTY;  Surgeon: Kerin Salen, MD;  Location: [place];  Service: Orthopedics;  Laterality:  Left;   TOTAL KNEE ARTHROPLASTY Right 03/04/2014   Procedure: TOTAL KNEE ARTHROPLASTY;  Surgeon: Kerin Salen, MD;  Location: [place];  Service: Orthopedics;  Laterality: Right;   TOTAL KNEE ARTHROPLASTY Left 09/30/2018   Procedure: Revision Left Knee Arthroplasty;  Surgeon: Frederik Pear, MD;  Location: [facility];  Service: Orthopedics;  Laterality: Left;   TOTAL KNEE REVISION Right 07/07/2019   Procedure: RIGHT TOTAL KNEE REVISION;  Surgeon: Frederik Pear, MD;  Location: [facility];  Service: Orthopedics;  Laterality: Right;   TRANSFORAMINAL LUMBAR INTERBODY FUSION (TLIF) WITH PEDICLE SCREW FIXATION 1 LEVEL Left 03/28/2018   Procedure: LEFT SIDED LUMBAR FOUR-FIVE TRANSFORAMINAL LUMBAR INTERBODY FUSION WITH INSTRUMENTATION AND ALLOGRAFT;  Surgeon: Phylliss Bob, MD;  Location: [place];  Service: Orthopedics;  Laterality: Left;   VASECTOMY      FHx:    Reviewed / unchanged  SHx:    Reviewed / unchanged   Systems Review:  Constitutional: Denies fever, chills, wt changes, headaches, insomnia, fatigue, night sweats, change in appetite. Eyes: Denies redness, blurred vision, diplopia, discharge, itchy, watery eyes.  ENT: Denies discharge, congestion, post nasal drip, epistaxis, sore throat, earache, hearing loss, dental pain, tinnitus, vertigo, sinus pain, snoring.  CV: Denies chest pain, palpitations, irregular heartbeat, syncope, dyspnea, diaphoresis, orthopnea, PND, claudication or edema. Respiratory: denies cough, dyspnea, DOE, pleurisy, hoarseness, laryngitis, wheezing.  Gastrointestinal: Denies dysphagia, odynophagia, heartburn, reflux, water brash, abdominal pain or cramps, nausea, vomiting, bloating, diarrhea,  constipation, hematemesis, melena, hematochezia  or hemorrhoids. Genitourinary: Denies dysuria, frequency, urgency, nocturia, hesitancy, discharge, hematuria or flank pain. Musculoskeletal: Denies arthralgias, myalgias, stiffness, jt. swelling, pain, limping or strain/sprain.  Skin: Denies  pruritus, rash, hives, warts, acne, eczema or change in skin lesion(s). Neuro: No weakness, tremor, incoordination, spasms, paresthesia or pain. Psychiatric: Denies confusion, memory loss or sensory loss. Endo: Denies change in weight, skin or hair change.  Heme/Lymph: No excessive bleeding, bruising or enlarged lymph nodes.  Physical Exam  BP 118/62   Pulse 72   Temp 97.9 F (36.6 C)   Resp 16   Ht 5\' 8"  (1.727 m)   Wt 174 lb (78.9 kg)   SpO2 98%   BMI 26.46 kg/m   Appears  well nourished, well groomed  and in no distress.  Eyes: PERRLA, EOMs, conjunctiva no swelling or erythema. Sinuses: No frontal/maxillary tenderness ENT/Mouth: EAC's clear, TM's nl w/o erythema, bulging. Nares clear w/o erythema, swelling, exudates. Oropharynx clear without erythema or exudates. Oral hygiene is good. Tongue normal, non obstructing. Hearing intact.  Neck: Supple. Thyroid not palpable. Car 2+/2+ without bruits, nodes or JVD. Chest: Respirations nl with BS clear & equal w/o rales, rhonchi, wheezing or stridor.  Cor: Heart sounds normal w/ regular rate and rhythm without sig. murmurs, gallops, clicks or rubs. Peripheral pulses normal and equal  without edema.  Abdomen: Soft & bowel sounds normal. Non-tender w/o guarding, rebound, hernias, masses or organomegaly.  Lymphatics: Unremarkable.  Musculoskeletal: Full ROM all peripheral extremities, joint stability, 5/5 strength and normal gait.  Skin: Warm, dry without exposed rashes, lesions or ecchymosis apparent.  Neuro: Cranial nerves intact, reflexes equal bilaterally. Sensory-motor testing grossly intact. Tendon reflexes grossly intact.  Pysch: Alert & oriented x 3.  Insight and judgement nl & appropriate. No ideations.  Assessment and Plan:  1. Essential hypertension  - Continue medication, monitor blood pressure at home.  - Continue DASH diet.  Reminder to go to the ER if any CP,  SOB, nausea, dizziness, severe HA, changes vision/speech.    - CBC with Differential/Platelet - COMPLETE METABOLIC PANEL WITH GFR - Magnesium - TSH  2.  Hyperlipidemia, mixed  - Continue diet/meds, exercise,& lifestyle modifications.  - Continue monitor periodic cholesterol/liver & renal functions   - Lipid panel - TSH  3. Abnormal glucose  - Continue diet, exercise  - Lifestyle modifications.  - Monitor appropriate labs   - Hemoglobin A1c - Insulin, random  4. Vitamin D deficiency  - Continue supplementation   - VITAMIN D 25 Hydroxy   5. Medication management  - CBC with Differential/Platelet - COMPLETE METABOLIC PANEL WITH GFR - Magnesium - Lipid panel - TSH - Hemoglobin A1c - Insulin, random - VITAMIN D 25 Hydroxy   6. Need for immunization against influenza  - Flu Vaccine QUAD 6+ mos PF IM (Fluarix Quad PF)          Discussed  regular exercise, BP monitoring, weight control to achieve/maintain BMI less than 25 and discussed med and SE's. Recommended labs to assess and monitor clinical status with further disposition pending results of labs.  I discussed the assessment and treatment plan with the patient. The patient was provided an opportunity to ask questions and all were answered. The patient agreed with the plan and demonstrated an understanding of the instructions.  I provided over 30 minutes of exam, counseling, chart review and  complex critical decision making.         The patient was advised to call  back or seek an in-person evaluation if the symptoms worsen or if the condition fails to improve as anticipated.   Kirtland Bouchard, MD

## 2022-03-23 LAB — CBC WITH DIFFERENTIAL/PLATELET
Absolute Monocytes: 616 cells/uL (ref 200–950)
Basophils Absolute: 38 cells/uL (ref 0–200)
Basophils Relative: 0.8 %
Eosinophils Absolute: 108 cells/uL (ref 15–500)
Eosinophils Relative: 2.3 %
HCT: 37 % — ABNORMAL LOW (ref 38.5–50.0)
Hemoglobin: 13.1 g/dL — ABNORMAL LOW (ref 13.2–17.1)
Lymphs Abs: 1302 cells/uL (ref 850–3900)
MCH: 33.2 pg — ABNORMAL HIGH (ref 27.0–33.0)
MCHC: 35.4 g/dL (ref 32.0–36.0)
MCV: 93.9 fL (ref 80.0–100.0)
MPV: 10.7 fL (ref 7.5–12.5)
Monocytes Relative: 13.1 %
Neutro Abs: 2637 cells/uL (ref 1500–7800)
Neutrophils Relative %: 56.1 %
Platelets: 223 10*3/uL (ref 140–400)
RBC: 3.94 10*6/uL — ABNORMAL LOW (ref 4.20–5.80)
RDW: 12.6 % (ref 11.0–15.0)
Total Lymphocyte: 27.7 %
WBC: 4.7 10*3/uL (ref 3.8–10.8)

## 2022-03-23 LAB — LIPID PANEL
Cholesterol: 177 mg/dL (ref <200)
HDL: 66 mg/dL (ref >=40)
LDL Cholesterol (Calc): 70 mg/dL (calc)
Non-HDL Cholesterol (Calc): 111 mg/dL (calc) (ref <130)
Total CHOL/HDL Ratio: 2.7 (calc) (ref <5.0)
Triglycerides: 337 mg/dL — ABNORMAL HIGH (ref <150)

## 2022-03-23 LAB — COMPLETE METABOLIC PANEL WITH GFR
AG Ratio: 2.1 (calc) (ref 1.0–2.5)
ALT: 34 U/L (ref 9–46)
AST: 28 U/L (ref 10–35)
Albumin: 4.5 g/dL (ref 3.6–5.1)
Alkaline phosphatase (APISO): 67 U/L (ref 35–144)
BUN: 11 mg/dL (ref 7–25)
CO2: 26 mmol/L (ref 20–32)
Calcium: 9.7 mg/dL (ref 8.6–10.3)
Chloride: 97 mmol/L — ABNORMAL LOW (ref 98–110)
Creat: 0.92 mg/dL (ref 0.70–1.30)
Globulin: 2.1 g/dL (calc) (ref 1.9–3.7)
Glucose, Bld: 89 mg/dL (ref 65–99)
Potassium: 4.2 mmol/L (ref 3.5–5.3)
Sodium: 132 mmol/L — ABNORMAL LOW (ref 135–146)
Total Bilirubin: 0.4 mg/dL (ref 0.2–1.2)
Total Protein: 6.6 g/dL (ref 6.1–8.1)
eGFR: 96 mL/min/{1.73_m2} (ref >=60)

## 2022-03-23 LAB — MAGNESIUM: Magnesium: 1.8 mg/dL (ref 1.5–2.5)

## 2022-03-23 LAB — INSULIN, RANDOM: Insulin: 25.5 u[IU]/mL — ABNORMAL HIGH

## 2022-03-23 LAB — HEMOGLOBIN A1C
Hgb A1c MFr Bld: 5 % of total Hgb (ref <5.7)
Mean Plasma Glucose: 97 mg/dL
eAG (mmol/L): 5.4 mmol/L

## 2022-03-23 LAB — VITAMIN D 25 HYDROXY (VIT D DEFICIENCY, FRACTURES): Vit D, 25-Hydroxy: 63 ng/mL (ref 30–100)

## 2022-03-23 LAB — TSH: TSH: 1.42 mIU/L (ref 0.40–4.50)

## 2022-03-23 NOTE — Progress Notes (Signed)
<><><><><><><><><><><><><><><><><><><><><><><><><><><><><><><><><> <><><><><><><><><><><><><><><><><><><><><><><><><><><><><><><><><> -   Test results slightly outside the reference range are not unusual. If there is anything important, I will review this with you,  otherwise it is considered normal test values.  If you have further questions,  please do not hesitate to contact me at the office or via My Chart.  <><><><><><><><><><><><><><><><><><><><><><><><><><><><><><><><><> <><><><><><><><><><><><><><><><><><><><><><><><><><><><><><><><><>  -  Total Chol = 177 & LDL 70 - Both  Excellent   - Very low risk for Heart Attack  / Stroke <><><><><><><><><><><><><><><><><><><><><><><><><><><><><><><><><>  -   But Triglycerides (  337   ) or fats in blood are too high                 (   Ideal or  Goal is less than 150  !  )    - Recommend avoid fried & greasy foods,  sweets / candy,   - Avoid white rice  (brown or wild rice or Quinoa is OK),   - Avoid white potatoes  (sweet potatoes are OK)   - Avoid anything made from white flour  - bagels, doughnuts, rolls, buns, biscuits, white and   wheat breads, pizza crust and traditional  pasta made of white flour & egg white  - (vegetarian pasta or spinach or wheat pasta is OK).    - Multi-grain bread is OK - like multi-grain flat bread or  sandwich thins.   - Avoid alcohol in excess.   - Exercise is also important. <><><><><><><><><><><><><><><><><><><><><><><><><><><><><><><><><>  -   Magnesium = 1.8  is  very  low  - goal is betw 2.0 - 2.5,   - So......Marland Kitchen  Recommend that you take Magnesium 500 mg tablet  2 x /day w/meals  - also important to eat lots of  leafy green vegetables   - spinach - Kale - collards - greens - okra - asparagus  - broccoli - quinoa - squash - almonds   - black, red, white beans  -  peas - green beans <><><><><><><><><><><><><><><><><><><><><><><><><><><><><><><><><>  -  A1c = 5.0  - Normal - No  Diabetes -  Great ! <><><><><><><><><><><><><><><><><><><><><><><><><><><><><><><><><>  -  Vitamin D = 63 - Excellent       -      Please keep dose same  <><><><><><><><><><><><><><><><><><><><><><><><><><><><><><><><><>  -  All Else - CBC - Kidneys - Electrolytes - Liver - Magnesium & Thyroid    - all  Normal / OK ===========================================================

## 2022-03-27 ENCOUNTER — Other Ambulatory Visit: Payer: Self-pay

## 2022-03-27 ENCOUNTER — Encounter (HOSPITAL_COMMUNITY): Payer: Self-pay

## 2022-03-27 ENCOUNTER — Emergency Department (HOSPITAL_COMMUNITY)
Admission: EM | Admit: 2022-03-27 | Discharge: 2022-03-27 | Disposition: A | Discharge status: Home / Self Care | Payer: BC Managed Care – PPO | Attending: Emergency Medicine | Admitting: Emergency Medicine

## 2022-03-27 ENCOUNTER — Emergency Department (HOSPITAL_COMMUNITY): Payer: BC Managed Care – PPO

## 2022-03-27 DIAGNOSIS — S0232XA Fracture of orbital floor, left side, initial encounter for closed fracture: Secondary | ICD-10-CM | POA: Diagnosis not present

## 2022-03-27 DIAGNOSIS — S0285XB Fracture of orbit, unspecified, initial encounter for open fracture: Secondary | ICD-10-CM

## 2022-03-27 DIAGNOSIS — S0592XA Unspecified injury of left eye and orbit, initial encounter: Secondary | ICD-10-CM | POA: Diagnosis not present

## 2022-03-27 DIAGNOSIS — R519 Headache, unspecified: Secondary | ICD-10-CM | POA: Insufficient documentation

## 2022-03-27 DIAGNOSIS — W108XXA Fall (on) (from) other stairs and steps, initial encounter: Secondary | ICD-10-CM | POA: Insufficient documentation

## 2022-03-27 DIAGNOSIS — H2513 Age-related nuclear cataract, bilateral: Secondary | ICD-10-CM | POA: Diagnosis not present

## 2022-03-27 DIAGNOSIS — S02842A Fracture of lateral orbital wall, left side, initial encounter for closed fracture: Secondary | ICD-10-CM | POA: Insufficient documentation

## 2022-03-27 DIAGNOSIS — H1132 Conjunctival hemorrhage, left eye: Secondary | ICD-10-CM | POA: Diagnosis not present

## 2022-03-27 DIAGNOSIS — S0990XA Unspecified injury of head, initial encounter: Secondary | ICD-10-CM | POA: Diagnosis not present

## 2022-03-27 DIAGNOSIS — Z23 Encounter for immunization: Secondary | ICD-10-CM | POA: Diagnosis not present

## 2022-03-27 LAB — I-STAT CHEM 8, ED
BUN: 9 mg/dL (ref 6–20)
Calcium, Ion: 1.14 mmol/L — ABNORMAL LOW (ref 1.15–1.40)
Chloride: 97 mmol/L — ABNORMAL LOW (ref 98–111)
Creatinine, Ser: 0.9 mg/dL (ref 0.61–1.24)
Glucose, Bld: 81 mg/dL (ref 70–99)
HCT: 41 % (ref 39.0–52.0)
Hemoglobin: 13.9 g/dL (ref 13.0–17.0)
Potassium: 4.4 mmol/L (ref 3.5–5.1)
Sodium: 132 mmol/L — ABNORMAL LOW (ref 135–145)
TCO2: 24 mmol/L (ref 22–32)

## 2022-03-27 LAB — CBC WITH DIFFERENTIAL/PLATELET
Abs Immature Granulocytes: 0.02 10*3/uL (ref 0.00–0.07)
Basophils Absolute: 0 10*3/uL (ref 0.0–0.1)
Basophils Relative: 1 %
Eosinophils Absolute: 0.1 10*3/uL (ref 0.0–0.5)
Eosinophils Relative: 1 %
HCT: 40.5 % (ref 39.0–52.0)
Hemoglobin: 14 g/dL (ref 13.0–17.0)
Immature Granulocytes: 0 %
Lymphocytes Relative: 23 %
Lymphs Abs: 1.5 10*3/uL (ref 0.7–4.0)
MCH: 32.7 pg (ref 26.0–34.0)
MCHC: 34.6 g/dL (ref 30.0–36.0)
MCV: 94.6 fL (ref 80.0–100.0)
Monocytes Absolute: 0.8 10*3/uL (ref 0.1–1.0)
Monocytes Relative: 12 %
Neutro Abs: 4.1 10*3/uL (ref 1.7–7.7)
Neutrophils Relative %: 63 %
Platelets: 241 10*3/uL (ref 150–400)
RBC: 4.28 MIL/uL (ref 4.22–5.81)
RDW: 12.5 % (ref 11.5–15.5)
WBC: 6.4 10*3/uL (ref 4.0–10.5)
nRBC: 0 % (ref 0.0–0.2)

## 2022-03-27 LAB — COMPREHENSIVE METABOLIC PANEL
ALT: 40 U/L (ref 0–44)
AST: 42 U/L — ABNORMAL HIGH (ref 15–41)
Albumin: 4.6 g/dL (ref 3.5–5.0)
Alkaline Phosphatase: 59 U/L (ref 38–126)
Anion gap: 12 (ref 5–15)
BUN: 8 mg/dL (ref 6–20)
CO2: 23 mmol/L (ref 22–32)
Calcium: 9.6 mg/dL (ref 8.9–10.3)
Chloride: 97 mmol/L — ABNORMAL LOW (ref 98–111)
Creatinine, Ser: 1.01 mg/dL (ref 0.61–1.24)
GFR, Estimated: >60 mL/min (ref >60)
Glucose, Bld: 87 mg/dL (ref 70–99)
Potassium: 4.3 mmol/L (ref 3.5–5.1)
Sodium: 132 mmol/L — ABNORMAL LOW (ref 135–145)
Total Bilirubin: 1.1 mg/dL (ref 0.3–1.2)
Total Protein: 7.2 g/dL (ref 6.5–8.1)

## 2022-03-27 MED ORDER — IOHEXOL 350 MG/ML SOLN
75.0000 mL | Freq: Once | INTRAVENOUS | Status: AC | PRN
  Administered 2022-03-27: 75 mL via INTRAVENOUS

## 2022-03-27 MED ORDER — FENTANYL CITRATE PF 50 MCG/ML IJ SOSY
50.0000 ug | PREFILLED_SYRINGE | Freq: Once | INTRAMUSCULAR | Status: AC
  Administered 2022-03-27: 50 ug via INTRAVENOUS
  Filled 2022-03-27: qty 1

## 2022-03-27 MED ORDER — AMOXICILLIN-POT CLAVULANATE 875-125 MG PO TABS
1.0000 | ORAL_TABLET | Freq: Once | ORAL | Status: AC
  Administered 2022-03-27: 1 via ORAL
  Filled 2022-03-27: qty 1

## 2022-03-27 MED ORDER — TETANUS-DIPHTH-ACELL PERTUSSIS 5-2.5-18.5 LF-MCG/0.5 IM SUSY
0.5000 mL | PREFILLED_SYRINGE | Freq: Once | INTRAMUSCULAR | Status: AC
  Administered 2022-03-27: 0.5 mL via INTRAMUSCULAR
  Filled 2022-03-27: qty 0.5

## 2022-03-27 MED ORDER — AMOXICILLIN-POT CLAVULANATE 875-125 MG PO TABS: 1.0000 | ORAL_TABLET | Freq: Two times a day (BID) | ORAL | 0 refills | Status: DC

## 2022-03-27 MED ORDER — FLUORESCEIN SODIUM 1 MG OP STRP
1.0000 | ORAL_STRIP | Freq: Once | OPHTHALMIC | Status: AC
  Administered 2022-03-27: 1 via OPHTHALMIC
  Filled 2022-03-27: qty 1

## 2022-03-27 MED ORDER — TETRACAINE HCL 0.5 % OP SOLN
1.0000 [drp] | Freq: Once | OPHTHALMIC | Status: AC
  Administered 2022-03-27: 1 [drp] via OPHTHALMIC
  Filled 2022-03-27: qty 4

## 2022-03-27 MED ORDER — DIPHENHYDRAMINE HCL 25 MG PO CAPS
50.0000 mg | ORAL_CAPSULE | Freq: Once | ORAL | Status: AC
  Administered 2022-03-27: 50 mg via ORAL
  Filled 2022-03-27: qty 2

## 2022-03-27 NOTE — Discharge Instructions (Signed)
You were seen in the emergency department tonight for your orbital wall fractures.  While in the emergency department we got a CT scan which showed no fractures.  You will follow-up with the ophthalmologist tonight please see his name and number in the above paperwork.  He is follow-up with the ENT doctor Dr. Melissa Montane call his office this week to set up an appointment in 1 week.  Please start taking Augmentin 1 tablet in the morning and 1 tablet evening for the next 7 days.  If you begin developing nausea vomiting with fevers and chills and worsening eye pain please turn the emergency department soon as possible.  Sincerely, Clydie Braun, PGY-2

## 2022-03-27 NOTE — ED Provider Notes (Signed)
[facility] Provider Note   CSN: [id number] Arrival date & time: 03/27/22  1426     History {Add pertinent medical, surgical, social history, OB history to HPI:1} Chief Complaint  Patient presents with   Eye Injury    Jermaine Stewart is a 62 y.o. male.   Eye Injury       Home Medications Prior to Admission medications   Medication Sig Start Date End Date Taking? Authorizing Provider  atenolol (TENORMIN) 100 MG tablet TAKE 1 TABLET AT BEDTIME FOR BLOOD PRESSURE 01/13/21   Alycia Rossetti, NP  benzonatate (TESSALON) 100 MG capsule Take 1 capsule (100 mg total) by mouth 3 (three) times daily as needed. Patient not taking: Reported on 03/22/2022 01/03/22   Apolonio Schneiders, FNP  buPROPion (WELLBUTRIN XL) 150 MG 24 hr tablet TAKE 1 TABLET EVERY MORNING FOR MOOD, FOCUS AND CONCENTRATION 01/13/21   Alycia Rossetti, NP  Cholecalciferol (VITAMIN D) 125 MCG (5000 UT) CAPS Take by mouth. 15,000-20,000 units    [provider]  ezetimibe (ZETIA) 10 MG tablet TAKE 1 TABLET DAILY FOR CHOLESTEROL 02/23/22   Alycia Rossetti, NP  fenofibrate (TRICOR) 145 MG tablet TAKE 1 TABLET DAILY FOR BLOOD FATS Patient taking differently: Take 145 mg by mouth daily. Take 1 tablet daily for Blood Fats 08/29/18   Unk Pinto, MD  meloxicam [facility]) 15 MG tablet Take  1 tablet   Daily  with food for Pain & Inflammation 12/20/21   Darrol Jump, NP  Multiple Vitamins-Minerals (ZINC PO) Take by mouth.    [provider]  omeprazole (PRILOSEC) 40 MG capsule TAKE 1 CAPSULE TWICE A DAY FOR ACID INDIGESTION AND REFLUX 02/23/22   Alycia Rossetti, NP  rosuvastatin (CRESTOR) 20 MG tablet Take  1 tablet  every other day  on even days of month 09/21/21   Unk Pinto, MD  valsartan (DIOVAN) 160 MG tablet TAKE 1 TABLET DAILY FOR BLOOD PRESSURE 03/13/22   Darrol Jump, NP      Allergies    Atorvastatin and Percocet [oxycodone-acetaminophen]     Review of Systems   Review of Systems  Physical Exam Updated Vital Signs BP 138/81 (BP Location: Left Arm)   Pulse 79   Temp 98.1 F (36.7 C)   Resp 18   Ht 5\' 10"  (1.778 m)   Wt 77.1 kg   SpO2 98%   BMI 24.39 kg/m  Physical Exam  ED Results / Procedures / Treatments   Labs (all labs ordered are listed, but only abnormal results are displayed) Labs Reviewed - No data to display  EKG None  Radiology No results found.  Procedures Procedures  {Document cardiac monitor, telemetry assessment procedure when appropriate:1}  Medications Ordered in ED Medications - No data to display  ED Course/ Medical Decision Making/ A&P   {   Click here for ABCD2, HEART and other calculatorsREFRESH Note before signing :1}                          Medical Decision Making  ***  {Document critical care time when appropriate:1} {Document review of labs and clinical decision tools ie heart score, Chads2Vasc2 etc:1}  {Document your independent review of radiology images, and any outside records:1} {Document your discussion with family members, caretakers, and with consultants:1} {Document social determinants of health affecting pt's care:1} {Document your decision making why or why not admission, treatments were needed:1} Final Clinical Impression(s) /  ED Diagnoses Final diagnoses:  None    Rx / DC Orders ED Discharge Orders     None

## 2022-03-27 NOTE — ED Triage Notes (Addendum)
Pt presents with a L eye injury after attic steps fell and hit him 2 days ago. L eye is swollen closed with significant bruising to the upper lid.

## 2022-03-27 NOTE — ED Provider Triage Note (Signed)
Emergency Medicine Provider Triage Evaluation Note  Jermaine Stewart , a 62 y.o. male  was evaluated in triage.  Pt complains of significant pain and swelling to the left eye after attic stairs fell hitting him in the eye 2 days ago.  Patient states that stairs hit him so hard and knocked him to the ground where he hit the top of his head sustaining a small laceration.  States that since that time the swelling has been worsening and he presented to be evaluated at the clinic at his employer today where they recommended he come to ED for further evaluation.  When I is opened, he states that vision is at baseline.  He denies taking any anticoagulants.  He has some associated pain but denies any severe headache, vision changes, LOC/syncope, dizziness, nausea, vomiting, or other complaints at this time.  Review of Systems  Positive: See HPI Negative: See HPI  Physical Exam  BP 138/81 (BP Location: Left Arm)   Pulse 79   Temp 98.1 F (36.7 C)   Resp 18   Ht 5\' 10"  (1.778 m)   Wt 77.1 kg   SpO2 98%   BMI 24.39 kg/m  Gen:   Awake, no distress   Resp:  Normal effort  MSK:   Moves extremities without difficulty  Other:  Significant swelling and ecchymosis to the left upper and lower eyelids causing eye to be completely swollen shut, pupils equal round and reactive to light, no teardrop pupil, extraocular movements intact, superficial healing laceration to the left cheek, another very superficial healing laceration to the top of the head  Medical Decision Making  Medically screening exam initiated at 4:04 PM.  Appropriate orders placed.  Ehan Freas Guidice was informed that the remainder of the evaluation will be completed by another provider, this initial triage assessment does not replace that evaluation, and the importance of remaining in the ED until their evaluation is complete.     Suzzette Righter, PA-C 03/27/22 1607

## 2022-03-28 DIAGNOSIS — H3562 Retinal hemorrhage, left eye: Secondary | ICD-10-CM | POA: Diagnosis not present

## 2022-03-28 DIAGNOSIS — S058X2A Other injuries of left eye and orbit, initial encounter: Secondary | ICD-10-CM | POA: Diagnosis not present

## 2022-03-28 DIAGNOSIS — S0502XA Injury of conjunctiva and corneal abrasion without foreign body, left eye, initial encounter: Secondary | ICD-10-CM | POA: Diagnosis not present

## 2022-03-28 DIAGNOSIS — W228XXA Striking against or struck by other objects, initial encounter: Secondary | ICD-10-CM | POA: Diagnosis not present

## 2022-04-03 ENCOUNTER — Institutional Professional Consult (permissible substitution): Payer: BC Managed Care – PPO | Admitting: Plastic Surgery

## 2022-04-04 ENCOUNTER — Other Ambulatory Visit: Payer: Self-pay | Admitting: Nurse Practitioner

## 2022-04-04 DIAGNOSIS — I10 Essential (primary) hypertension: Secondary | ICD-10-CM

## 2022-04-04 DIAGNOSIS — F32A Depression, unspecified: Secondary | ICD-10-CM

## 2022-04-14 ENCOUNTER — Institutional Professional Consult (permissible substitution): Payer: BC Managed Care – PPO | Admitting: Plastic Surgery

## 2022-05-03 DIAGNOSIS — H43813 Vitreous degeneration, bilateral: Secondary | ICD-10-CM | POA: Diagnosis not present

## 2022-05-03 DIAGNOSIS — W228XXA Striking against or struck by other objects, initial encounter: Secondary | ICD-10-CM | POA: Diagnosis not present

## 2022-05-03 DIAGNOSIS — H43391 Other vitreous opacities, right eye: Secondary | ICD-10-CM | POA: Diagnosis not present

## 2022-05-03 DIAGNOSIS — S058X2A Other injuries of left eye and orbit, initial encounter: Secondary | ICD-10-CM | POA: Diagnosis not present

## 2022-06-13 ENCOUNTER — Institutional Professional Consult (permissible substitution): Payer: BC Managed Care – PPO | Admitting: Plastic Surgery

## 2022-08-14 ENCOUNTER — Other Ambulatory Visit: Payer: Self-pay | Admitting: Internal Medicine

## 2022-08-14 ENCOUNTER — Encounter: Payer: Self-pay | Admitting: Internal Medicine

## 2022-08-14 DIAGNOSIS — G8929 Other chronic pain: Secondary | ICD-10-CM

## 2022-08-14 MED ORDER — GABAPENTIN 600 MG PO TABS: ORAL_TABLET | ORAL | 1 refills | Status: AC

## 2022-09-13 ENCOUNTER — Encounter: Payer: BC Managed Care – PPO | Admitting: Internal Medicine

## 2022-09-15 ENCOUNTER — Other Ambulatory Visit: Payer: Self-pay | Admitting: Internal Medicine

## 2022-09-15 DIAGNOSIS — E782 Mixed hyperlipidemia: Secondary | ICD-10-CM

## 2022-10-31 ENCOUNTER — Encounter: Payer: Self-pay | Admitting: Internal Medicine

## 2022-10-31 ENCOUNTER — Ambulatory Visit (INDEPENDENT_AMBULATORY_CARE_PROVIDER_SITE_OTHER): Payer: BC Managed Care – PPO | Admitting: Internal Medicine

## 2022-10-31 VITALS — BP 120/66 | HR 66 | Temp 97.7°F | Resp 16 | Ht 67.0 in | Wt 166.2 lb

## 2022-10-31 DIAGNOSIS — D649 Anemia, unspecified: Secondary | ICD-10-CM

## 2022-10-31 DIAGNOSIS — I10 Essential (primary) hypertension: Secondary | ICD-10-CM

## 2022-10-31 DIAGNOSIS — R350 Frequency of micturition: Secondary | ICD-10-CM | POA: Diagnosis not present

## 2022-10-31 DIAGNOSIS — Z1211 Encounter for screening for malignant neoplasm of colon: Secondary | ICD-10-CM

## 2022-10-31 DIAGNOSIS — Z79899 Other long term (current) drug therapy: Secondary | ICD-10-CM

## 2022-10-31 DIAGNOSIS — Z0000 Encounter for general adult medical examination without abnormal findings: Secondary | ICD-10-CM | POA: Diagnosis not present

## 2022-10-31 DIAGNOSIS — I700 Atherosclerosis of aorta: Secondary | ICD-10-CM

## 2022-10-31 DIAGNOSIS — Z1329 Encounter for screening for other suspected endocrine disorder: Secondary | ICD-10-CM | POA: Diagnosis not present

## 2022-10-31 DIAGNOSIS — Z125 Encounter for screening for malignant neoplasm of prostate: Secondary | ICD-10-CM | POA: Diagnosis not present

## 2022-10-31 DIAGNOSIS — E559 Vitamin D deficiency, unspecified: Secondary | ICD-10-CM

## 2022-10-31 DIAGNOSIS — Z131 Encounter for screening for diabetes mellitus: Secondary | ICD-10-CM

## 2022-10-31 DIAGNOSIS — Z13220 Encounter for screening for lipoid disorders: Secondary | ICD-10-CM

## 2022-10-31 DIAGNOSIS — Z111 Encounter for screening for respiratory tuberculosis: Secondary | ICD-10-CM | POA: Diagnosis not present

## 2022-10-31 DIAGNOSIS — Z8249 Family history of ischemic heart disease and other diseases of the circulatory system: Secondary | ICD-10-CM

## 2022-10-31 DIAGNOSIS — R7309 Other abnormal glucose: Secondary | ICD-10-CM

## 2022-10-31 DIAGNOSIS — Z0001 Encounter for general adult medical examination with abnormal findings: Secondary | ICD-10-CM

## 2022-10-31 DIAGNOSIS — N401 Enlarged prostate with lower urinary tract symptoms: Secondary | ICD-10-CM | POA: Diagnosis not present

## 2022-10-31 DIAGNOSIS — R5383 Other fatigue: Secondary | ICD-10-CM

## 2022-10-31 DIAGNOSIS — Z1389 Encounter for screening for other disorder: Secondary | ICD-10-CM | POA: Diagnosis not present

## 2022-10-31 DIAGNOSIS — Z136 Encounter for screening for cardiovascular disorders: Secondary | ICD-10-CM | POA: Diagnosis not present

## 2022-10-31 DIAGNOSIS — E782 Mixed hyperlipidemia: Secondary | ICD-10-CM

## 2022-10-31 DIAGNOSIS — N138 Other obstructive and reflux uropathy: Secondary | ICD-10-CM

## 2022-10-31 DIAGNOSIS — E538 Deficiency of other specified B group vitamins: Secondary | ICD-10-CM

## 2022-10-31 NOTE — Patient Instructions (Addendum)
Due to recent changes in healthcare laws, you may see the results of your imaging and laboratory studies on MyChart before your provider has had a chance to review them.  We understand that in some cases there may be results that are confusing or concerning to you. Not all laboratory results come back in the same time frame and the provider may be waiting for multiple results in order to interpret others.  Please give [place] 48 hours in order for your provider to thoroughly review all the results before contacting the office for clarification of your results.   ++++++++++++++++++++++++++++++++++++++ Vitamin B12 Deficiency  Vitamin B12 deficiency occurs when the body does not have enough of this important vitamin. The body needs this vitamin: To make red blood cells. To make DNA. This is the genetic material inside cells. To help the nerves work properly so they can carry messages from the brain to the body. Vitamin B12 deficiency can cause health problems, such as not having enough red blood cells in the blood (anemia). This can lead to nerve damage if untreated. What are the causes? This condition may be caused by: Not eating enough foods that contain vitamin B12. Not having enough stomach acid and digestive fluids to properly absorb vitamin B12 from the food that you eat. Having certain diseases that make it hard to absorb vitamin B12. These diseases include Crohn's disease, chronic pancreatitis, and cystic fibrosis. An autoimmune disorder in which the body does not make enough of a protein (intrinsic factor) within the stomach, resulting in not enough absorption of vitamin B12. Having a surgery in which part of the stomach or small intestine is removed. Taking certain medicines that make it hard for the body to absorb vitamin B12. These include: Heartburn medicines, such as antacids and proton pump inhibitors. Some medicines that are used to treat diabetes. What increases the risk? The following  factors may make you more likely to develop a vitamin B12 deficiency: Being an older adult. Eating a vegetarian or vegan diet that does not include any foods that come from animals. Eating a poor diet while you are pregnant. Taking certain medicines. Having alcoholism. What are the signs or symptoms? In some cases, there are no symptoms of this condition. If the condition leads to anemia or nerve damage, various symptoms may occur, such as: Weakness. Tiredness (fatigue). Loss of appetite. Numbness or tingling in your hands and feet. Redness and burning of the tongue. Depression, confusion, or memory problems. Trouble walking. If anemia is severe, symptoms can include: Shortness of breath. Dizziness. Rapid heart rate. How is this diagnosed? This condition may be diagnosed with a blood test to measure the level of vitamin B12 in your blood. You may also have other tests, including: A group of tests that measure certain characteristics of blood cells (complete blood count, CBC). A blood test to measure intrinsic factor. A procedure where a thin tube with a camera on the end is used to look into your stomach or intestines (endoscopy). Other tests may be needed to discover the cause of the deficiency. How is this treated? Treatment for this condition depends on the cause. This condition may be treated by: Changing your eating and drinking habits, such as: Eating more foods that contain vitamin B12. Drinking less alcohol or no alcohol. Getting vitamin B12 injections. Taking vitamin B12 supplements by mouth (orally). Your health care provider will tell you which dose is best for you. Follow these instructions at home: Eating and drinking  Include  foods in your diet that come from animals and contain a lot of vitamin B12. These include: Meats and poultry. This includes beef, pork, chicken, [place], and organ meats, such as liver. Seafood. This includes clams, rainbow trout, salmon, tuna,  and haddock. Eggs. Dairy foods such as milk, yogurt, and cheese. Eat foods that have vitamin B12 added to them (are fortified), such as ready-to-eat breakfast cereals. Check the label on the package to see if a food is fortified. The items listed above may not be a complete list of foods and beverages you can eat and drink. Contact a dietitian for more information. Alcohol use Do not drink alcohol if: Your health care provider tells you not to drink. You are pregnant, may be pregnant, or are planning to become pregnant. If you drink alcohol: Limit how much you have to: 0-1 drink a day for women. 0-2 drinks a day for men. Know how much alcohol is in your drink. In the [place] one drink equals one 12 oz bottle of beer (355 mL), one 5 oz glass of wine (148 mL), or one 1 oz glass of hard liquor (44 mL). General instructions Get vitamin B12 injections if told to by your health care provider. Take supplements only as told by your health care provider. Follow the directions carefully. Keep all follow-up visits. This is important. Contact a health care provider if: Your symptoms come back. Your symptoms get worse or do not improve with treatment. Get help right away: You develop shortness of breath. You have a rapid heart rate. You have chest pain. You become dizzy or you faint. These symptoms may be an emergency. Get help right away. Call 911. Do not wait to see if the symptoms will go away. Do not drive yourself to the hospital. Summary Vitamin B12 deficiency occurs when the body does not have enough of this important vitamin. Common causes include not eating enough foods that contain vitamin B12, not being able to absorb vitamin B12 from the food that you eat, having a surgery in which part of the stomach or small intestine is removed, or taking certain medicines. Eat foods that have vitamin B12 in them. Treatment may include making a change in the way you eat and drink, getting vitamin  B12 injections, or taking vitamin B12 supplements. This information is not intended to replace advice given to you by your health care provider. Make sure you discuss any questions you have with your health care provider. Document Revised: 10/01/2020 Document Reviewed: 10/01/2020 Elsevier Patient Education  2024 [employer]   ++++++++++++++++++++++++++++++++++++++  Vit D  & Vit C 1,000 mg   are recommended to help protect  against the Covid-19 and other Corona viruses.    Also it's recommended  to take  Zinc 50 mg  to help  protect against the Covid-19   and best place to get  is also on [employer].[email or web address]  and don't pay more than 6-8 cents /pill !  =============================== Coronavirus (COVID-19) Are you at risk?  Are you at risk for the Coronavirus (COVID-19)?  To be considered HIGH RISK for Coronavirus (COVID-19), you have to meet the following criteria:  Traveled to [place], [place], [place], [place] or [place]; or in the [place] to [place], [place], [place]  or [place]; and have fever, cough, and shortness of breath within the last 2 weeks of travel OR Been in close contact with a person diagnosed with COVID-19 within the last 2 weeks and have  fever,  cough,and shortness of breath  IF YOU DO NOT MEET THESE CRITERIA, YOU ARE CONSIDERED LOW RISK FOR COVID-19.  What to do if you are HIGH RISK for COVID-19?  If you are having a medical emergency, call 911. Seek medical care right away. Before you go to a doctor's office, urgent care or emergency department,  call ahead and tell them about your recent travel, contact with someone diagnosed with COVID-19   and your symptoms.  You should receive instructions from your physician's office regarding next steps of care.  When you arrive at healthcare provider, tell the healthcare staff immediately you have returned from  visiting [place], [place], [place], [place] or [place]; or traveled in the [place] to [place],  [place],  [place] or [place] in the last two weeks or you have been in close contact with a person diagnosed with  COVID-19 in the last 2 weeks.   Tell the health care staff about your symptoms: fever, cough and shortness of breath. After you have been seen by a medical provider, you will be either: Tested for (COVID-19) and discharged home on quarantine except to seek medical care if  symptoms worsen, and asked to  Stay home and avoid contact with others until you get your results (4-5 days)  Avoid travel on public transportation if possible (such as bus, train, or airplane) or Sent to the Emergency Department by EMS for evaluation, COVID-19 testing  and  possible admission depending on your condition and test results.  What to do if you are LOW RISK for COVID-19?  Reduce your risk of any infection by using the same precautions used for avoiding the common cold or flu:  Wash your hands often with soap and warm water for at least 20 seconds.  If soap and water are not readily available,  use an alcohol-based hand sanitizer with at least 60% alcohol.  If coughing or sneezing, cover your mouth and nose by coughing or sneezing into the elbow areas of your shirt or coat,  into a tissue or into your sleeve (not your hands). Avoid shaking hands with others and consider head nods or verbal greetings only. Avoid touching your eyes, nose, or mouth with unwashed hands.  Avoid close contact with people who are sick. Avoid places or events with large numbers of people in one location, like concerts or sporting events. Carefully consider travel plans you have or are making. If you are planning any travel outside or inside the [place], visit the CDC's Travelers' Health webpage for the latest health notices. If you have some symptoms but not all symptoms, continue to monitor at home and seek medical attention  if your symptoms worsen. If you are having a medical emergency, call  911. >>>>>>>>>>>>>>>>>>>>>>>>>>>> Preventive Care for Adults  A healthy lifestyle and preventive care can promote health and wellness. Preventive health guidelines for men include the following key practices: A routine yearly physical is a good way to check with your health care provider about your health and preventative screening. It is a chance to share any concerns and updates on your health and to receive a thorough exam. Visit your dentist for a routine exam and preventative care every 6 months. Brush your teeth twice a day and floss once a day. Good oral hygiene prevents tooth decay and gum disease. The frequency of eye exams is based on your age, health, family medical history, use of contact lenses, and other factors. Follow your health care provider's recommendations  for frequency of eye exams. Eat a healthy diet. Foods such as vegetables, fruits, whole grains, low-fat dairy products, and lean protein foods contain the nutrients you need without too many calories. Decrease your intake of foods high in solid fats, added sugars, and salt. Eat the right amount of calories for you. Get information about a proper diet from your health care provider, if necessary. Regular physical exercise is one of the most important things you can do for your health. Most adults should get at least 150 minutes of moderate-intensity exercise (any activity that increases your heart rate and causes you to sweat) each week. In addition, most adults need muscle-strengthening exercises on 2 or more days a week. Maintain a healthy weight. The body mass index (BMI) is a screening tool to identify possible weight problems. It provides an estimate of body fat based on height and weight. Your health care provider can find your BMI and can help you achieve or maintain a healthy weight. For adults 20 years and older: A BMI below 18.5 is considered underweight. A BMI of 18.5 to 24.9 is normal. A BMI of 25 to 29.9 is considered  overweight. A BMI of 30 and above is considered obese. Maintain normal blood lipids and cholesterol levels by exercising and minimizing your intake of saturated fat. Eat a balanced diet with plenty of fruit and vegetables. Blood tests for lipids and cholesterol should begin at age 80 and be repeated every 5 years. If your lipid or cholesterol levels are high, you are over 50, or you are at high risk for heart disease, you may need your cholesterol levels checked more frequently. Ongoing high lipid and cholesterol levels should be treated with medicines if diet and exercise are not working. If you smoke, find out from your health care provider how to quit. If you do not use tobacco, do not start. Lung cancer screening is recommended for adults aged 55-80 years who are at high risk for developing lung cancer because of a history of smoking. A yearly low-dose CT scan of the lungs is recommended for people who have at least a 30-pack-year history of smoking and are a current smoker or have quit within the past 15 years. A pack year of smoking is smoking an average of 1 pack of cigarettes a day for 1 year (for example: 1 pack a day for 30 years or 2 packs a day for 15 years). Yearly screening should continue until the smoker has stopped smoking for at least 15 years. Yearly screening should be stopped for people who develop a health problem that would prevent them from having lung cancer treatment. If you choose to drink alcohol, do not have more than 2 drinks per day. One drink is considered to be 12 ounces (355 mL) of beer, 5 ounces (148 mL) of wine, or 1.5 ounces (44 mL) of liquor. Avoid use of street drugs. Do not share needles with anyone. Ask for help if you need support or instructions about stopping the use of drugs. High blood pressure causes heart disease and increases the risk of stroke. Your blood pressure should be checked at least every 1-2 years. Ongoing high blood pressure should be treated with  medicines, if weight loss and exercise are not effective. If you are 52-52 years old, ask your health care provider if you should take aspirin to prevent heart disease. Diabetes screening involves taking a blood sample to check your fasting blood sugar level. This should be done once every  3 years, after age 27, if you are within normal weight and without risk factors for diabetes. Testing should be considered at a younger age or be carried out more frequently if you are overweight and have at least 1 risk factor for diabetes. Colorectal cancer can be detected and often prevented. Most routine colorectal cancer screening begins at the age of 74 and continues through age 76. However, your health care provider may recommend screening at an earlier age if you have risk factors for colon cancer. On a yearly basis, your health care provider may provide home test kits to check for hidden blood in the stool. Use of a small camera at the end of a tube to directly examine the colon (sigmoidoscopy or colonoscopy) can detect the earliest forms of colorectal cancer. Talk to your health care provider about this at age 34, when routine screening begins. Direct exam of the colon should be repeated every 5-10 years through age 74, unless early forms of precancerous polyps or small growths are found.  Talk with your health care provider about prostate cancer screening. Testicular cancer screening isrecommended for adult males. Screening includes self-exam, a health care provider exam, and other screening tests. Consult with your health care provider about any symptoms you have or any concerns you have about testicular cancer. Use sunscreen. Apply sunscreen liberally and repeatedly throughout the day. You should seek shade when your shadow is shorter than you. Protect yourself by wearing long sleeves, pants, a wide-brimmed hat, and sunglasses year round, whenever you are outdoors. Once a month, do a whole-body skin exam, using  a mirror to look at the skin on your back. Tell your health care provider about new moles, moles that have irregular borders, moles that are larger than a pencil eraser, or moles that have changed in shape or color. Stay current with required vaccines (immunizations). Influenza vaccine. All adults should be immunized every year. Tetanus, diphtheria, and acellular pertussis (Td, Tdap) vaccine. An adult who has not previously received Tdap or who does not know his vaccine status should receive 1 dose of Tdap. This initial dose should be followed by tetanus and diphtheria toxoids (Td) booster doses every 10 years. Adults with an unknown or incomplete history of completing a 3-dose immunization series with Td-containing vaccines should begin or complete a primary immunization series including a Tdap dose. Adults should receive a Td booster every 10 years. Varicella vaccine. An adult without evidence of immunity to varicella should receive 2 doses or a second dose if he has previously received 1 dose. Human papillomavirus (HPV) vaccine. Males aged 13-21 years who have not received the vaccine previously should receive the 3-dose series. Males aged 22-26 years may be immunized. Immunization is recommended through the age of 57 years for any male who has sex with males and did not get any or all doses earlier. Immunization is recommended for any person with an immunocompromised condition through the age of 26 years if he did not get any or all doses earlier. During the 3-dose series, the second dose should be obtained 4-8 weeks after the first dose. The third dose should be obtained 24 weeks after the first dose and 16 weeks after the second dose. Zoster vaccine. One dose is recommended for adults aged 79 years or older unless certain conditions are present.  PREVNAR  - Pneumococcal 13-valent conjugate (PCV13) vaccine. When indicated, a person who is uncertain of his immunization history and has no record of  immunization should receive the  PCV13 vaccine. An adult aged 88 years or older who has certain medical conditions and has not been previously immunized should receive 1 dose of PCV13 vaccine. This PCV13 should be followed with a dose of pneumococcal polysaccharide (PPSV23) vaccine. The PPSV23 vaccine dose should be obtained at least 1 r more year(s) after the dose of PCV13 vaccine. An adult aged 89 years or older who has certain medical conditions and previously received 1 or more doses of PPSV23 vaccine should receive 1 dose of PCV13. The PCV13 vaccine dose should be obtained 1 or more years after the last PPSV23 vaccine dose.  PNEUMOVAX - Pneumococcal polysaccharide (PPSV23) vaccine. When PCV13 is also indicated, PCV13 should be obtained first. All adults aged 19 years and older should be immunized. An adult younger than age 82 years who has certain medical conditions should be immunized. Any person who resides in a nursing home or long-term care facility should be immunized. An adult smoker should be immunized. People with an immunocompromised condition and certain other conditions should receive both PCV13 and PPSV23 vaccines. People with human immunodeficiency virus (HIV) infection should be immunized as soon as possible after diagnosis. Immunization during chemotherapy or radiation therapy should be avoided. Routine use of PPSV23 vaccine is not recommended for American Indians, [address], or people younger than 65 years unless there are medical conditions that require PPSV23 vaccine. When indicated, people who have unknown immunization and have no record of immunization should receive PPSV23 vaccine. One-time revaccination 5 years after the first dose of PPSV23 is recommended for people aged 19-64 years who have chronic kidney failure, nephrotic syndrome, asplenia, or immunocompromised conditions. People who received 1-2 doses of PPSV23 before age 82 years should receive another dose of PPSV23 vaccine  at age 56 years or later if at least 5 years have passed since the previous dose. Doses of PPSV23 are not needed for people immunized with PPSV23 at or after age 19 years.  Hepatitis A vaccine. Adults who wish to be protected from this disease, have certain high-risk conditions, work with hepatitis A-infected animals, work in hepatitis A research labs, or travel to or work in countries with a high rate of hepatitis A should be immunized. Adults who were previously unvaccinated and who anticipate close contact with an international adoptee during the first 60 days after arrival in the [place] from a country with a high rate of hepatitis A should be immunized.  Hepatitis B vaccine. Adults should be immunized if they wish to be protected from this disease, have certain high-risk conditions, may be exposed to blood or other infectious body fluids, are household contacts or sex partners of hepatitis B positive people, are clients or workers in certain care facilities, or travel to or work in countries with a high rate of hepatitis B.  Preventive Service / Frequency  Ages 78 to 37 Blood pressure check. Lipid and cholesterol check Lung cancer screening. / Every year if you are aged 55-80 years and have a 30-pack-year history of smoking and currently smoke or have quit within the past 15 years. Yearly screening is stopped once you have quit smoking for at least 15 years or develop a health problem that would prevent you from having lung cancer treatment. Fecal occult blood test (FOBT) of stool. / Every year beginning at age 54 and continuing until age 21. You may not have to do this test if you get a colonoscopy every 10 years. Flexible sigmoidoscopy** or colonoscopy.** / Every 5 years for  a flexible sigmoidoscopy or every 10 years for a colonoscopy beginning at age 48 and continuing until age 90. Screening for abdominal aortic aneurysm (AAA)  by ultrasound is recommended for people who have history of  high blood pressure or who are current or former smokers. +++++++++++ Recommend Adult Low Dose Aspirin or  coated  Aspirin 81 mg daily  To reduce risk of Colon Cancer 40 %,  Skin Cancer 26 % ,  Malignant Melanoma 46%  and  Pancreatic cancer 60% ++++++++++++++++++++ Vitamin D goal  is between 70-100.  Please make sure that you are taking your Vitamin D as directed.  It is very important as a natural anti-inflammatory  helping hair, skin, and nails, as well as reducing stroke and heart attack risk.  It helps your bones and helps with mood. It also decreases numerous cancer risks so please take it as directed.  Low Vit D is associated with a 200-300% higher risk for CANCER  and 200-300% higher risk for HEART   ATTACK  &  STROKE.   .....................................Marland Kitchen It is also associated with higher death rate at younger ages,  autoimmune diseases like Rheumatoid arthritis, Lupus, Multiple Sclerosis.    Also many other serious conditions, like depression, Alzheimer's Dementia, infertility, muscle aches, fatigue, fibromyalgia - just to name a few. +++++++++++++++++++++ Recommend the book "The END of DIETING" by Dr Monico Hoar  & the book "The END of DIABETES " by Dr Monico Hoar At [email or web address] - get book & Audio CD's    Being diabetic has a  300% increased risk for heart attack, stroke, cancer, and alzheimer- type vascular dementia. It is very important that you work harder with diet by avoiding all foods that are white. Avoid white rice (brown & wild rice is OK), white potatoes (sweetpotatoes in moderation is OK), White bread or wheat bread or anything made out of white flour like bagels, donuts, rolls, buns, biscuits, cakes, pastries, cookies, pizza crust, and pasta (made from white flour & egg whites) - vegetarian pasta or spinach or wheat pasta is OK. Multigrain breads like Arnold's or [employer], or multigrain sandwich thins or flatbreads.  Diet, exercise and weight loss can  reverse and cure diabetes in the early stages.  Diet, exercise and weight loss is very important in the control and prevention of complications of diabetes which affects every system in your body, ie. Brain - dementia/stroke, eyes - glaucoma/blindness, heart - heart attack/heart failure, kidneys - dialysis, stomach - gastric paralysis, intestines - malabsorption, nerves - severe painful neuritis, circulation - gangrene & loss of a leg(s), and finally cancer and Alzheimers.    I recommend avoid fried & greasy foods,  sweets/candy, white rice (brown or wild rice or Quinoa is OK), white potatoes (sweet potatoes are OK) - anything made from white flour - bagels, doughnuts, rolls, buns, biscuits,white and wheat breads, pizza crust and traditional pasta made of white flour & egg white(vegetarian pasta or spinach or wheat pasta is OK).  Multi-grain bread is OK - like multi-grain flat bread or sandwich thins. Avoid alcohol in excess. Exercise is also important.    Eat all the vegetables you want - avoid meat, especially red meat and dairy - especially cheese.  Cheese is the most concentrated form of trans-fats which is the worst thing to clog up our arteries. Veggie cheese is OK which can be found in the fresh produce section at [employer] or [employer] or [employer]  ++++++++++++++++++++++ DASH Eating Plan  DASH stands for "  Dietary Approaches to Stop Hypertension."   The DASH eating plan is a healthy eating plan that has been shown to reduce high blood pressure (hypertension). Additional health benefits may include reducing the risk of type 2 diabetes mellitus, heart disease, and stroke. The DASH eating plan may also help with weight loss. WHAT DO I NEED TO KNOW ABOUT THE DASH EATING PLAN? For the DASH eating plan, you will follow these general guidelines: Choose foods with a percent daily value for sodium of less than 5% (as listed on the food label). Use salt-free seasonings or herbs instead of table  salt or sea salt. Check with your health care provider or pharmacist before using salt substitutes. Eat lower-sodium products, often labeled as "lower sodium" or "no salt added." Eat fresh foods. Eat more vegetables, fruits, and low-fat dairy products. Choose whole grains. Look for the word "whole" as the first word in the ingredient list. Choose fish  Limit sweets, desserts, sugars, and sugary drinks. Choose heart-healthy fats. Eat veggie cheese  Eat more home-cooked food and less restaurant, buffet, and fast food. Limit fried foods. Cook foods using methods other than frying. Limit canned vegetables. If you do use them, rinse them well to decrease the sodium. When eating at a restaurant, ask that your food be prepared with less salt, or no salt if possible.                      WHAT FOODS CAN I EAT? Read Dr Francis Dowse Fuhrman's books on The End of Dieting & The End of Diabetes  Grains Whole grain or whole wheat bread. Brown rice. Whole grain or whole wheat pasta. Quinoa, bulgur, and whole grain cereals. Low-sodium cereals. Corn or whole wheat flour tortillas. Whole grain cornbread. Whole grain crackers. Low-sodium crackers.  Vegetables Fresh or frozen vegetables (raw, steamed, roasted, or grilled). Low-sodium or reduced-sodium tomato and vegetable juices. Low-sodium or reduced-sodium tomato sauce and paste. Low-sodium or reduced-sodium canned vegetables.   Fruits All fresh, canned (in natural juice), or frozen fruits.  Protein Products  All fish and seafood.  Dried beans, peas, or lentils. Unsalted nuts and seeds. Unsalted canned beans.  Dairy Low-fat dairy products, such as skim or 1% milk, 2% or reduced-fat cheeses, low-fat ricotta or cottage cheese, or plain low-fat yogurt. Low-sodium or reduced-sodium cheeses.  Fats and Oils Tub margarines without trans fats. Light or reduced-fat mayonnaise and salad dressings (reduced sodium). Avocado. Safflower, olive, or canola oils. Natural  peanut or almond butter.  Other Unsalted popcorn and pretzels. The items listed above may not be a complete list of recommended foods or beverages. Contact your dietitian for more options.  +++++++++++++++++++  WHAT FOODS ARE NOT RECOMMENDED? Grains/ White flour or wheat flour White bread. White pasta. White rice. Refined cornbread. Bagels and croissants. Crackers that contain trans fat.  Vegetables  Creamed or fried vegetables. Vegetables in a . Regular canned vegetables. Regular canned tomato sauce and paste. Regular tomato and vegetable juices.  Fruits Dried fruits. Canned fruit in light or heavy syrup. Fruit juice.  Meat and Other Protein Products Meat in general - RED meat & White meat.  Fatty cuts of meat. Ribs, chicken wings, all processed meats as bacon, sausage, bologna, salami, fatback, hot dogs, bratwurst and packaged luncheon meats.  Dairy Whole or 2% milk, cream, half-and-half, and cream cheese. Whole-fat or sweetened yogurt. Full-fat cheeses or blue cheese. Non-dairy creamers and whipped toppings. Processed cheese, cheese spreads, or cheese curds.  Condiments Onion and  garlic salt, seasoned salt, table salt, and sea salt. Canned and packaged gravies. Worcestershire sauce. Tartar sauce. Barbecue sauce. Teriyaki sauce. Soy sauce, including reduced sodium. Steak sauce. Fish sauce. Oyster sauce. Cocktail sauce. Horseradish. Ketchup and mustard. Meat flavorings and tenderizers. Bouillon cubes. Hot sauce. Tabasco sauce. Marinades. Taco seasonings. Relishes.  Fats and Oils Butter, stick margarine, lard, shortening and bacon fat. Coconut, palm kernel, or palm oils. Regular salad dressings.  Pickles and olives. Salted popcorn and pretzels.  The items listed above may not be a complete list of foods and beverages to avoid.

## 2022-10-31 NOTE — Progress Notes (Signed)
Annual  Screening/Preventative Visit  & Comprehensive Evaluation & Examination   Future Appointments  Date Time Provider Department  10/31/2022                     cpe  3:00 PM Jermaine Cowboy, MD [facility]  11/12/2023                    cpe  3:00 PM Jermaine Cowboy, MD [facility]          This very nice 62 y.o. MWM with  HTN, HLD, Prediabetes and Vitamin D Deficiency  presents for a Screening /Preventative Visit & comprehensive evaluation and management of multiple medical co-morbidities.        Patient is treated for HTN (2014) & BP has been controlled on Atenolol . Patient's BP has been controlled and today's BP is at goal - 120/66 . Patient denies any cardiac symptoms as chest pain, palpitations, shortness of breath, dizziness or ankle swelling.        Hyperlipidemia is controlled with diet & Rosuvastatin Jermaine Stewart. Patient denies myalgias or other med SE's. Previous Lipids were at goal with very high HDL (103) and low LDL (85). Last lipid profile was at goal except elevated Trig's :   Lab Results  Component Value Date   CHOL 177 03/22/2022   HDL 66 03/22/2022   LDLCALC 70 03/22/2022   TRIG 337 (H) 03/22/2022   CHOLHDL 2.7 03/22/2022         Patient is followed expectantly for glucose intolerance  and patient denies reactive hypoglycemic symptoms, visual blurring, diabetic polys or paresthesias. Last A1c was at goal :    Lab Results  Component Value Date   HGBA1C 5.0 03/22/2022         Patient hass had several very low Vitamin B12 levels ranging 314-4-5 and last year was very low at 228 & he was recommended to take SL Vit B12.  Finally, patient has history of Vitamin D Deficiency ("25" /2014) and last vitamin D was not  at goal :   Lab Results  Component Value Date   VD25OH 63 03/22/2022       Current Outpatient Medications  Medication Instructions   amoxicillin-clavulanate (AUGMENTIN) 875-125 MG tablet 1 tablet, Oral, 2 times daily    atenolol (TENORMIN) 100 MG tablet TAKE 1 TABLET AT BEDTIME FOR BLOOD PRESSURE   buPROPion (WELLBUTRIN XL) 150 MG 24 hr tablet TAKE 1 TABLET EVERY MORNING FOR MOOD, FOCUS AND CONCENTRATION   Cholecalciferol (VITAMIN D) 125 MCG (5000 UT) CAPS Oral, 15,000-20,000 units   ezetimibe (ZETIA) 10 MG tablet TAKE 1 TABLET DAILY FOR CHOLESTEROL   fenofibrate (TRICOR) 145 MG tablet TAKE 1 TABLET DAILY FOR BLOOD FATS   gabapentin (NEURONTIN) 600 MG tablet Take  1/2 to 1 tablet  3 to 4 x /day  as needed for pain                                /                                                                   TAKE  BY                                                 MOUTH   meloxicam (MOBIC) 15 MG tablet Take  1 tablet   Daily  with food for Pain & Inflammation   Multiple Vitamins-Minerals (ZINC PO) Oral   omeprazole (PRILOSEC) 40 MG capsule TAKE 1 CAPSULE TWICE A DAY FOR ACID INDIGESTION AND REFLUX   rosuvastatin (CRESTOR) 20 MG tablet TAKE 1 TABLET EVERY OTHER DAY ON EVEN DAYS OF THE MONTH   valsartan (DIOVAN) 160 MG tablet TAKE 1 TABLET DAILY FOR BLOOD PRESSURE    Allergies  Allergen Reactions   Atorvastatin Other (See Comments)    Arthralgias   Percocet [Oxycodone-Acetaminophen] Other (See Comments)    Hallucinates     Past Medical History:  Diagnosis Date   Arthritis    Depression    GERD (gastroesophageal reflux disease)    Hyperlipidemia    Hypertension      Health Maintenance  Topic Date Due   Pneumococcal Vaccine 59-50 Years old (1 - PCV) Never done   Zoster Vaccines- Shingrix (1 of 2) Never done   COLONOSCOPY Never done   COVID-19 Vaccine (3 - Pfizer risk series) 06/25/2019   INFLUENZA VACCINE  09/20/2020   TETANUS/TDAP  07/23/2023   Hepatitis C Screening  Completed   HIV Screening  Completed   HPV VACCINES  Aged Out     Immunization History  Administered Date(s) Administered   Influenza Inj Mdck Quad  11/29/2017   Influenza,inj,Quad   01/31/2019   Influenza,inj,quad 12/21/2015   Influenza 11/01/2016   PFIZER-SARS-COV-2 Vacc 04/27/2019, 05/28/2019   PPD Test 06/26/2017, 07/31/2018, 08/19/2019   Tdap 07/22/2013    Last Colon - has declined colonoscopy   Past Surgical History:  Procedure Laterality Date   HERNIA REPAIR Bilateral    15 years ago   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   Procedure: TOTAL KNEE ARTHROPLASTY;  Surgeon: Jermaine Lewandowsky, MD;  Location: [facility];  Service: Orthopedics;  Laterality: Left;   TOTAL KNEE ARTHROPLASTY Right 03/04/2014   Procedure: TOTAL KNEE ARTHROPLASTY;  Surgeon: Jermaine Lewandowsky, MD;  Location: [facility];  Service: Orthopedics;  Laterality: Right;   TOTAL KNEE ARTHROPLASTY Left 09/30/2018   Procedure: Revision Left Knee Arthroplasty;  Surgeon: Jermaine Birchwood, MD;  Location: [facility];  Service: Orthopedics;  Laterality: Left;   TOTAL KNEE REVISION Right 07/07/2019   Procedure: RIGHT TOTAL KNEE REVISION;  Surgeon: Jermaine Birchwood, MD;  Location: [facility];  Service: Orthopedics;  Laterality: Right;   TRANSFORAMINAL LUMBAR INTERBODY FUSION (TLIF) WITH PEDICLE SCREW FIXATION 1 LEVEL Left 03/28/2018   Procedure: LEFT SIDED LUMBAR FOUR-FIVE TRANSFORAMINAL LUMBAR INTERBODY FUSION WITH INSTRUMENTATION AND ALLOGRAFT;  Surgeon: Jermaine Bamberg, MD;  Location: [facility];  Service: Orthopedics;  Laterality: Left;   VASECTOMY       Family History  Problem Relation Age of Onset   Breast cancer Mother    Throat cancer Father    Hypertension Brother    Stroke Brother    Hypertension Brother    Hypertension Brother    Colon cancer Neg Hx    Stomach cancer Neg Hx    Rectal cancer Neg Hx     Social History   Socioeconomic History   Marital status: Married    Spouse name: Jermaine Stewart   Number  of children: 2 daughters  Occupational History   Not on file  Tobacco Use   Smoking status: Never   Smokeless tobacco: Former    Types: Associate Professor Use: Never used  Substance and Sexual Activity   Alcohol  use: Yes    Alcohol/week: 14.0 standard drinks    Types: 14 Cans of beer per week    Comment: 2-3 beer a day   Drug use: No   Sexual activity: Yes    Partners: Female    ROS Constitutional: Denies fever, chills, weight loss/gain, headaches, insomnia,  night sweats or change in appetite. Does c/o fatigue. Eyes: Denies redness, blurred vision, diplopia, discharge, itchy or watery eyes.  ENT: Denies discharge, congestion, post nasal drip, epistaxis, sore throat, earache, hearing loss, dental pain, Tinnitus, Vertigo, Sinus pain or snoring.  Cardio: Denies chest pain, palpitations, irregular heartbeat, syncope, dyspnea, diaphoresis, orthopnea, PND, claudication or edema Respiratory: denies cough, dyspnea, DOE, pleurisy, hoarseness, laryngitis or wheezing.  Gastrointestinal: Denies dysphagia, heartburn, reflux, water brash, pain, cramps, nausea, vomiting, bloating, diarrhea, constipation, hematemesis, melena, hematochezia, jaundice or hemorrhoids Genitourinary: Denies dysuria, frequency, urgency, nocturia, hesitancy, discharge, hematuria or flank pain Musculoskeletal: Denies arthralgia, myalgia, stiffness, Jt. Swelling, pain, limp or strain/sprain. Denies Falls. Skin: Denies puritis, rash, hives, warts, acne, eczema or change in skin lesion Neuro: No weakness, tremor, incoordination, spasms, paresthesia or pain Psychiatric: Denies confusion, memory loss or sensory loss. Denies Depression. Endocrine: Denies change in weight, skin, hair change, nocturia, and paresthesia, diabetic polys, visual blurring or hyper / hypo glycemic episodes.  Heme/Lymph: No excessive bleeding, bruising or enlarged lymph nodes.   Physical Exam  BP 120/66   Pulse 66   Temp 97.7 F (36.5 C)   Resp 16   Ht 5\' 7"  (1.702 m)   Wt 166 lb 3.2 oz (75.4 kg)   SpO2 98%   BMI 26.03 kg/m   General Appearance: Well nourished and well groomed and in no apparent distress.  Eyes: PERRLA, EOMs, conjunctiva no swelling or  erythema, normal fundi and vessels. Sinuses: No frontal/maxillary tenderness ENT/Mouth: EACs patent / TMs  nl. Nares clear without erythema, swelling, mucoid exudates. Oral hygiene is good. No erythema, swelling, or exudate. Tongue normal, non-obstructing. Tonsils not swollen or erythematous. Hearing normal.  Neck: Supple, thyroid not palpable. No bruits, nodes or JVD. Respiratory: Respiratory effort normal.  BS equal and clear bilateral without rales, rhonci, wheezing or stridor. Cardio: Heart sounds are normal with regular rate and rhythm and no murmurs, rubs or gallops. Peripheral pulses are normal and equal bilaterally without edema. No aortic or femoral bruits. Chest: symmetric with normal excursions and percussion.  Abdomen: Soft, with Nl bowel sounds. Nontender, no guarding, rebound, hernias, masses, or organomegaly.  Lymphatics: Non tender without lymphadenopathy.  Musculoskeletal: Full ROM all peripheral extremities, joint stability, 5/5 strength, and normal gait. Skin: Warm and dry without rashes, lesions, cyanosis, clubbing or  ecchymosis.  Neuro: Cranial nerves intact, reflexes equal bilaterally. Normal muscle tone, no cerebellar symptoms. Sensation intact.  Pysch: Alert and oriented   x 3 with normal affect, insight and judgment appropriate.   Assessment and Plan  1. Annual Preventative/Screening Exam     2. Essential hypertension  - EKG 12-Lead - [place], RETROPERITNL ABD,  LTD - Urinalysis, Routine w reflex microscopic - Microalbumin / creatinine urine ratio - CBC with Differential/Platelet - COMPLETE METABOLIC PANEL WITH GFR - Magnesium - TSH   3. Hyperlipidemia, mixed  - EKG 12-Lead - [place], RETROPERITNL  ABD,  LTD - Lipid panel - TSH   4. Abnormal glucose  - EKG 12-Lead - [place], RETROPERITNL ABD,  LTD - Hemoglobin A1c - Insulin, random   5. Vitamin D deficiency & Vitamin B12 Deficiency   - VITAMIN D 25 Hydroxy - Vitamin B12 Level   6. BPH with  obstruction/lower urinary tract symptoms  - PSA   7. Prostate cancer screening  - PSA   8. Screening examination for pulmonary tuberculosis  - TB Skin Test   9. Screening for colorectal cancer  (Has declined Colonoscopy)   - Cologard    10. Screening for heart disease  - EKG 12-Lead   11. FHx: heart disease  - EKG 12-Lead - [place], RETROPERITNL ABD,  LTD   12. Screening for AAA (aortic abdominal aneurysm)  - [place], RETROPERITNL ABD,  LTD   13. Fatigue, unspecified type  - Vitamin B12 - Iron, TIBC and Ferritin Panel - Testosterone - CBC with Differential/Platelet - TSH   14. Medication management  - Urinalysis, Routine w reflex microscopic - Microalbumin / creatinine urine ratio - CBC with Differential/Platelet - COMPLETE METABOLIC PANEL WITH GFR - Magnesium - Lipid panel - TSH - Hemoglobin A1c - Insulin, random - VITAMIN D 25 Hydroxy          Patient was counseled in prudent diet, weight control to achieve/maintain BMI less than 25, BP monitoring, regular exercise and medications as discussed.  Discussed med effects and SE's. Routine screening labs and tests as requested with regular follow-up as recommended. Over 40 minutes of exam, counseling, chart review and high complex critical decision making was performed   Marinus Maw, MD

## 2022-11-01 ENCOUNTER — Other Ambulatory Visit: Payer: Self-pay | Admitting: Internal Medicine

## 2022-11-01 LAB — CBC WITH DIFFERENTIAL/PLATELET
Absolute Monocytes: 504 {cells}/uL (ref 200–950)
Basophils Absolute: 29 {cells}/uL (ref 0–200)
Basophils Relative: 0.7 %
Eosinophils Absolute: 90 {cells}/uL (ref 15–500)
Eosinophils Relative: 2.2 %
HCT: 39.4 % (ref 38.5–50.0)
Hemoglobin: 13.6 g/dL (ref 13.2–17.1)
Lymphs Abs: 1111 {cells}/uL (ref 850–3900)
MCH: 32.9 pg (ref 27.0–33.0)
MCHC: 34.5 g/dL (ref 32.0–36.0)
MCV: 95.4 fL (ref 80.0–100.0)
MPV: 10.3 fL (ref 7.5–12.5)
Monocytes Relative: 12.3 %
Neutro Abs: 2366 {cells}/uL (ref 1500–7800)
Neutrophils Relative %: 57.7 %
Platelets: 201 10*3/uL (ref 140–400)
RBC: 4.13 10*6/uL — ABNORMAL LOW (ref 4.20–5.80)
RDW: 12.4 % (ref 11.0–15.0)
Total Lymphocyte: 27.1 %
WBC: 4.1 10*3/uL (ref 3.8–10.8)

## 2022-11-01 LAB — MICROALBUMIN / CREATININE URINE RATIO
Creatinine, Urine: 146 mg/dL (ref 20–320)
Microalb Creat Ratio: 3 mg/g{creat} (ref <30)
Microalb, Ur: 0.4 mg/dL

## 2022-11-01 LAB — COMPLETE METABOLIC PANEL WITH GFR
AG Ratio: 2.4 (calc) (ref 1.0–2.5)
ALT: 49 U/L — ABNORMAL HIGH (ref 9–46)
AST: 43 U/L — ABNORMAL HIGH (ref 10–35)
Albumin: 5 g/dL (ref 3.6–5.1)
Alkaline phosphatase (APISO): 69 U/L (ref 35–144)
BUN: 9 mg/dL (ref 7–25)
CO2: 28 mmol/L (ref 20–32)
Calcium: 10.3 mg/dL (ref 8.6–10.3)
Chloride: 96 mmol/L — ABNORMAL LOW (ref 98–110)
Creat: 0.95 mg/dL (ref 0.70–1.30)
Globulin: 2.1 g/dL (ref 1.9–3.7)
Glucose, Bld: 90 mg/dL (ref 65–99)
Potassium: 4.5 mmol/L (ref 3.5–5.3)
Sodium: 132 mmol/L — ABNORMAL LOW (ref 135–146)
Total Bilirubin: 0.6 mg/dL (ref 0.2–1.2)
Total Protein: 7.1 g/dL (ref 6.1–8.1)
eGFR: 92 mL/min/{1.73_m2} (ref >=60)

## 2022-11-01 LAB — URINALYSIS, ROUTINE W REFLEX MICROSCOPIC
Bilirubin Urine: NEGATIVE
Glucose, UA: NEGATIVE
Hgb urine dipstick: NEGATIVE
Ketones, ur: NEGATIVE
Leukocytes,Ua: NEGATIVE
Nitrite: NEGATIVE
Protein, ur: NEGATIVE
Specific Gravity, Urine: 1.014 (ref 1.001–1.035)
pH: 7 (ref 5.0–8.0)

## 2022-11-01 LAB — LIPID PANEL
Cholesterol: 184 mg/dL (ref <200)
HDL: 79 mg/dL (ref >=40)
LDL Cholesterol (Calc): 75 mg/dL
Non-HDL Cholesterol (Calc): 105 mg/dL (ref <130)
Total CHOL/HDL Ratio: 2.3 (calc) (ref <5.0)
Triglycerides: 198 mg/dL — ABNORMAL HIGH (ref <150)

## 2022-11-01 LAB — IRON,TIBC AND FERRITIN PANEL
%SAT: 36 % (ref 20–48)
Ferritin: 127 ng/mL (ref 38–380)
Iron: 122 ug/dL (ref 50–180)
TIBC: 339 ug/dL (ref 250–425)

## 2022-11-01 LAB — PSA: PSA: 0.39 ng/mL (ref <=4.00)

## 2022-11-01 LAB — HEMOGLOBIN A1C
Hgb A1c MFr Bld: 5.2 %{Hb} (ref <5.7)
Mean Plasma Glucose: 103 mg/dL
eAG (mmol/L): 5.7 mmol/L

## 2022-11-01 LAB — TESTOSTERONE: Testosterone: 407 ng/dL (ref 250–827)

## 2022-11-01 LAB — MAGNESIUM: Magnesium: 1.9 mg/dL (ref 1.5–2.5)

## 2022-11-01 LAB — VITAMIN B12: Vitamin B-12: 352 pg/mL (ref 200–1100)

## 2022-11-01 LAB — INSULIN, RANDOM: Insulin: 17.6 u[IU]/mL

## 2022-11-01 LAB — VITAMIN D 25 HYDROXY (VIT D DEFICIENCY, FRACTURES): Vit D, 25-Hydroxy: 77 ng/mL (ref 30–100)

## 2022-11-01 LAB — TSH: TSH: 2.16 m[IU]/L (ref 0.40–4.50)

## 2022-11-01 NOTE — Progress Notes (Signed)
<>*<>*<>*<>*<>*<>*<>*<>*<>*<>*<>*<>*<>*<>*<>*<>*<>*<>*<>*<>*<>*<>*<>*<>*<> <>*<>*<>*<>*<>*<>*<>*<>*<>*<>*<>*<>*<>*<>*<>*<>*<>*<>*<>*<>*<>*<>*<>*<>*<>  -  Test results slightly outside the reference range are not unusual. If there is anything important, I will review this with you,  otherwise it is considered normal test values.  If you have further questions,  please do not hesitate to contact me at the office or via My Chart.   <>*<>*<>*<>*<>*<>*<>*<>*<>*<>*<>*<>*<>*<>*<>*<>*<>*<>*<>*<>*<>*<>*<>*<>*<> <>*<>*<>*<>*<>*<>*<>*<>*<>*<>*<>*<>*<>*<>*<>*<>*<>*<>*<>*<>*<>*<>*<>*<>*<>  -  Chol = 184   &  LDL = 75   -   Both   Excellent   - Very low risk for Heart Attack  / Stroke  <>*<>*<>*<>*<>*<>*<>*<>*<>*<>*<>*<>*<>*<>*<>*<>*<>*<>*<>*<>*<>*<>*<>*<>*<> <>*<>*<>*<>*<>*<>*<>*<>*<>*<>*<>*<>*<>*<>*<>*<>*<>*<>*<>*<>*<>*<>*<>*<>*<>   -  Vitamin B12 =  352   is  Very Low  (Ideal or Goal Vit B12 is between 450 - 1,100)   Low Vit B12 may be associated with Anemia , Fatigue,   Peripheral Neuropathy, Dementia, "Brain Fog", & Depression  - Recommend take a sub-lingual form of Vitamin B12 tablet   1,000 to 5,000 mcg tab that you dissolve under your tongue /Daily   - Can get [place] Dana - best price at [employer] or on [employer]  <>*<>*<>*<>*<>*<>*<>*<>*<>*<>*<>*<>*<>*<>*<>*<>*<>*<>*<>*<>*<>*<>*<>*<>*<> <>*<>*<>*<>*<>*<>*<>*<>*<>*<>*<>*<>*<>*<>*<>*<>*<>*<>*<>*<>*<>*<>*<>*<>*<>  -  Iron levels - Normal   <>*<>*<>*<>*<>*<>*<>*<>*<>*<>*<>*<>*<>*<>*<>*<>*<>*<>*<>*<>*<>*<>*<>*<>*<> <>*<>*<>*<>*<>*<>*<>*<>*<>*<>*<>*<>*<>*<>*<>*<>*<>*<>*<>*<>*<>*<>*<>*<>*<>  -  PSA very low - No Prostate Cancer -   Great  !   <>*<>*<>*<>*<>*<>*<>*<>*<>*<>*<>*<>*<>*<>*<>*<>*<>*<>*<>*<>*<>*<>*<>*<>*<> <>*<>*<>*<>*<>*<>*<>*<>*<>*<>*<>*<>*<>*<>*<>*<>*<>*<>*<>*<>*<>*<>*<>*<>*<>  -  Testosterone  Normal    <>*<>*<>*<>*<>*<>*<>*<>*<>*<>*<>*<>*<>*<>*<>*<>*<>*<>*<>*<>*<>*<>*<>*<>*<> <>*<>*<>*<>*<>*<>*<>*<>*<>*<>*<>*<>*<>*<>*<>*<>*<>*<>*<>*<>*<>*<>*<>*<>*<>  -  A1c - Normal - No Diabetes  - Great   <>*<>*<>*<>*<>*<>*<>*<>*<>*<>*<>*<>*<>*<>*<>*<>*<>*<>*<>*<>*<>*<>*<>*<>*<> <>*<>*<>*<>*<>*<>*<>*<>*<>*<>*<>*<>*<>*<>*<>*<>*<>*<>*<>*<>*<>*<>*<>*<>*<>  - Vitamin D 12 - Great - Please keep dose same   <>*<>*<>*<>*<>*<>*<>*<>*<>*<>*<>*<>*<>*<>*<>*<>*<>*<>*<>*<>*<>*<>*<>*<>*<> <>*<>*<>*<>*<>*<>*<>*<>*<>*<>*<>*<>*<>*<>*<>*<>*<>*<>*<>*<>*<>*<>*<>*<>*<>  . <>*<>*<>*<>*<>*<>*<>*<>*<>*<>*<>*<>*<>*<>*<>*<>*<>*<>*<>*<>*<>*<>*<>*<>*<> <>*<>*<>*<>*<>*<>*<>*<>*<>*<>*<>*<>*<>*<>*<>*<>*<>*<>*<>*<>*<>*<>*<>*<>*<>  -  All Else - CBC - Kidneys - Electrolytes - Liver - Magnesium & Thyroid    - all  Normal / OK  <>*<>*<>*<>*<>*<>*<>*<>*<>*<>*<>*<>*<>*<>*<>*<>*<>*<>*<>*<>*<>*<>*<>*<>*<> <>*<>*<>*<>*<>*<>*<>*<>*<>*<>*<>*<>*<>*<>*<>*<>*<>*<>*<>*<>*<>*<>*<>*<>*<>

## 2022-11-04 ENCOUNTER — Encounter: Payer: Self-pay | Admitting: Internal Medicine

## 2022-11-06 DIAGNOSIS — Z111 Encounter for screening for respiratory tuberculosis: Secondary | ICD-10-CM | POA: Diagnosis not present

## 2022-11-13 DIAGNOSIS — M79672 Pain in left foot: Secondary | ICD-10-CM | POA: Diagnosis not present

## 2022-11-29 ENCOUNTER — Other Ambulatory Visit: Payer: Self-pay | Admitting: Orthopaedic Surgery

## 2022-11-29 DIAGNOSIS — M79671 Pain in right foot: Secondary | ICD-10-CM

## 2022-12-11 ENCOUNTER — Encounter: Payer: Self-pay | Admitting: Internal Medicine

## 2022-12-11 ENCOUNTER — Telehealth: Payer: BC Managed Care – PPO | Admitting: Physician Assistant

## 2022-12-11 ENCOUNTER — Other Ambulatory Visit: Payer: Self-pay | Admitting: Internal Medicine

## 2022-12-11 DIAGNOSIS — R300 Dysuria: Secondary | ICD-10-CM

## 2022-12-11 MED ORDER — TAMSULOSIN HCL 0.4 MG PO CAPS: ORAL_CAPSULE | ORAL | 0 refills | Status: AC

## 2022-12-11 NOTE — Progress Notes (Signed)
E-Visit for Urinary Problems  Based on what you shared with me, I feel your condition warrants further evaluation and I recommend that you be seen for a face to face office visit.  Male bladder infections are not very common.  We worry about prostate or kidney conditions.  The standard of care is to examine the abdomen and kidneys, and to do a urine and blood test to make sure that something more serious is not going on.  Even with potential kidney stones we do like to consider imaging for this to make sure it is a size that can be passed without intervention. We recommend that you see a provider today.  If your doctor's office is closed [place] has the following Urgent Cares:    NOTE: You will not be charged for this e-visit.  If you are having a true medical emergency please call 911.       For an urgent face to face visit, [place] has six urgent care centers for your convenience:     [facility] Health Urgent Care Center at [facility] Directions [phone] [address] [place], [place] [address]    [facility] Health Urgent Care Center [facility]) Get Driving Directions [phone] [address] [place], [place] [address]  [facility] Health Urgent Care Center [facility]) Get Driving Directions [phone] [address] [place],  [place]  [address]  [facility] Health Urgent Care at [facility] Get Driving Directions [phone] [address] [place], [place] [address]   [facility] Health Urgent Care at [facility] Get Driving Directions  [phone] [address] [place], [place] [address]   [facility] Health Urgent Care at [facility] Directions [phone] [address], [address] [place], [place] [address]  Your MyChart E-visit questionnaire answers were reviewed by a board certified advanced clinical practitioner to complete your personal care plan based on your specific symptoms.   Thank you for using e-Visits.    I have spent 5 minutes in review of e-visit questionnaire, review and updating patient chart, medical decision making and response to patient.   Margaretann Loveless, PA-C

## 2022-12-12 ENCOUNTER — Ambulatory Visit: Payer: BC Managed Care – PPO | Admitting: Internal Medicine

## 2022-12-12 DIAGNOSIS — I2510 Atherosclerotic heart disease of native coronary artery without angina pectoris: Secondary | ICD-10-CM | POA: Diagnosis not present

## 2022-12-12 DIAGNOSIS — S32028A Other fracture of second lumbar vertebra, initial encounter for closed fracture: Secondary | ICD-10-CM | POA: Diagnosis not present

## 2022-12-12 DIAGNOSIS — F32A Depression, unspecified: Secondary | ICD-10-CM | POA: Diagnosis not present

## 2022-12-12 DIAGNOSIS — N329 Bladder disorder, unspecified: Secondary | ICD-10-CM | POA: Diagnosis not present

## 2022-12-12 DIAGNOSIS — X58XXXA Exposure to other specified factors, initial encounter: Secondary | ICD-10-CM | POA: Diagnosis not present

## 2022-12-12 DIAGNOSIS — F419 Anxiety disorder, unspecified: Secondary | ICD-10-CM | POA: Diagnosis not present

## 2022-12-12 DIAGNOSIS — N3289 Other specified disorders of bladder: Secondary | ICD-10-CM | POA: Diagnosis not present

## 2022-12-12 DIAGNOSIS — R1032 Left lower quadrant pain: Secondary | ICD-10-CM | POA: Diagnosis not present

## 2022-12-12 DIAGNOSIS — Z79899 Other long term (current) drug therapy: Secondary | ICD-10-CM | POA: Diagnosis not present

## 2022-12-12 DIAGNOSIS — K219 Gastro-esophageal reflux disease without esophagitis: Secondary | ICD-10-CM | POA: Diagnosis not present

## 2022-12-12 DIAGNOSIS — Z981 Arthrodesis status: Secondary | ICD-10-CM | POA: Diagnosis not present

## 2022-12-12 DIAGNOSIS — I700 Atherosclerosis of aorta: Secondary | ICD-10-CM | POA: Diagnosis not present

## 2022-12-12 DIAGNOSIS — E785 Hyperlipidemia, unspecified: Secondary | ICD-10-CM | POA: Diagnosis not present

## 2022-12-12 DIAGNOSIS — R109 Unspecified abdominal pain: Secondary | ICD-10-CM | POA: Diagnosis not present

## 2022-12-12 DIAGNOSIS — E871 Hypo-osmolality and hyponatremia: Secondary | ICD-10-CM | POA: Diagnosis not present

## 2022-12-12 DIAGNOSIS — I158 Other secondary hypertension: Secondary | ICD-10-CM | POA: Diagnosis not present

## 2022-12-12 NOTE — Progress Notes (Signed)
     C  A N  C  E L  L  E  D          

## 2022-12-13 DIAGNOSIS — E871 Hypo-osmolality and hyponatremia: Secondary | ICD-10-CM | POA: Diagnosis not present

## 2022-12-14 ENCOUNTER — Other Ambulatory Visit: Payer: Self-pay | Admitting: Nurse Practitioner

## 2022-12-14 DIAGNOSIS — G8929 Other chronic pain: Secondary | ICD-10-CM

## 2023-02-05 ENCOUNTER — Ambulatory Visit: Payer: BC Managed Care – PPO | Admitting: Nurse Practitioner

## 2023-02-05 ENCOUNTER — Encounter: Payer: Self-pay | Admitting: Nurse Practitioner

## 2023-02-05 VITALS — BP 138/80 | HR 99 | Temp 98.5°F | Ht 67.0 in | Wt 167.4 lb

## 2023-02-05 DIAGNOSIS — R7309 Other abnormal glucose: Secondary | ICD-10-CM | POA: Diagnosis not present

## 2023-02-05 DIAGNOSIS — I10 Essential (primary) hypertension: Secondary | ICD-10-CM

## 2023-02-05 DIAGNOSIS — Z23 Encounter for immunization: Secondary | ICD-10-CM

## 2023-02-05 DIAGNOSIS — E782 Mixed hyperlipidemia: Secondary | ICD-10-CM | POA: Diagnosis not present

## 2023-02-05 DIAGNOSIS — K219 Gastro-esophageal reflux disease without esophagitis: Secondary | ICD-10-CM | POA: Diagnosis not present

## 2023-02-05 DIAGNOSIS — N23 Unspecified renal colic: Secondary | ICD-10-CM

## 2023-02-05 DIAGNOSIS — Z79899 Other long term (current) drug therapy: Secondary | ICD-10-CM | POA: Diagnosis not present

## 2023-02-05 DIAGNOSIS — E559 Vitamin D deficiency, unspecified: Secondary | ICD-10-CM

## 2023-02-05 NOTE — Progress Notes (Signed)
FOLLOW UP  Assessment and Plan:   Essential hypertension Discussed DASH (Dietary Approaches to Stop Hypertension) DASH diet is lower in sodium than a typical American diet. Cut back on foods that are high in saturated fat, cholesterol, and trans fats. Eat more whole-grain foods, fish, poultry, and nuts Remain active and exercise as tolerated daily.  Monitor BP at home-Call if greater than 130/80.  Gastroesophageal reflux disease, unspecified whether esophagitis present No suspected reflux complications (Barret/stricture). Lifestyle modification:  wt loss, avoid meals 2-3h before bedtime. Consider eliminating food triggers:  chocolate, caffeine, EtOH, acid/spicy food.  Mixed hyperlipidemia Discussed lifestyle modifications. Recommended diet heavy in fruits and veggies, omega 3's. Decrease consumption of animal meats, cheeses, and dairy products. Remain active and exercise as tolerated. Continue to monitor.  Medication management All medications discussed and reviewed in full. All questions and concerns regarding medications addressed.   Vitamin D deficiency Continue supplement for goal of 60-100 Monitor Vitamin D levels  Abnormal glucose Education: Reviewed 'ABCs' of diabetes management  Discussed goals to be met and/or maintained include A1C (<7) Blood pressure (<130/80) Cholesterol (LDL <70) Continue Eye Exam yearly  Continue Dental Exam Q6 mo Discussed dietary recommendations Discussed Physical Activity recommendations Foot exam UTD Check A1C  Renal Colic Resolved  Need for flu vaccine Administered - patient tolerated well and without complications.  Orders Placed This Encounter  Procedures   Fluzone Trivalent Flu Vaccine ([place] dose preparattion)   CBC with Differential/Platelet   COMPLETE METABOLIC PANEL WITH GFR   Lipid panel   Hemoglobin A1c   Notify office for further evaluation and treatment, questions or concerns if any reported s/s fail to improve.    The patient was advised to call back or seek an in-person evaluation if any symptoms worsen or if the condition fails to improve as anticipated.   Further disposition pending results of labs. Discussed med's effects and SE's.    I discussed the assessment and treatment plan with the patient. The patient was provided an opportunity to ask questions and all were answered. The patient agreed with the plan and demonstrated an understanding of the instructions.  Discussed med's effects and SE's. Screening labs and tests as requested with regular follow-up as recommended.  I provided 30 minutes of face-to-face time during this encounter including counseling, chart review, and critical decision making was preformed.   Future Appointments  Date Time Provider Department Center  05/07/2023  4:00 PM Lucky Cowboy, MD [facility] None  08/13/2023  4:00 PM Adela Glimpse, NP [facility] None  11/19/2023  3:00 PM Lucky Cowboy, MD [facility] None    ----------------------------------------------------------------------------------------------------------------------  HPI 62 y.o. male  presents for 3 month follow up on hypertension, cholesterol, diabetes, weight and vitamin D deficiency.   Overall he reports feeling well today.  BMI is Body mass index is 26.22 kg/m., he has been working on diet and exercise. Wt Readings from Last 3 Encounters:  02/05/23 167 lb 6.4 oz (75.9 kg)  10/31/22 166 lb 3.2 oz (75.4 kg)  03/27/22 170 lb (77.1 kg)    His blood pressure has been controlled at home, today their BP is BP: 138/80  He does workout. He denies chest pain, shortness of breath, dizziness.   He is not on cholesterol medication Rosuvastatin and denies myalgias. His cholesterol is not at goal. The cholesterol last visit was:   Lab Results  Component Value Date   CHOL 184 10/31/2022   HDL 79 10/31/2022   LDLCALC 75 10/31/2022   TRIG 198 (H)  10/31/2022   CHOLHDL 2.3 10/31/2022    He has  been working on diet and exercise for prediabetes, and denies polydipsia and polyuria. Last A1C in the office was:  Lab Results  Component Value Date   HGBA1C 5.2 10/31/2022   Patient is on Vitamin D supplement.   Lab Results  Component Value Date   VD25OH 77 10/31/2022        Current Medications:  Current Outpatient Medications on File Prior to Visit  Medication Sig   atenolol (TENORMIN) 100 MG tablet TAKE 1 TABLET AT BEDTIME FOR BLOOD PRESSURE   buPROPion (WELLBUTRIN XL) 150 MG 24 hr tablet TAKE 1 TABLET EVERY MORNING FOR MOOD, FOCUS AND CONCENTRATION   Cholecalciferol (VITAMIN D) 125 MCG (5000 UT) CAPS Take by mouth. 15,000-20,000 units   ezetimibe (ZETIA) 10 MG tablet TAKE 1 TABLET DAILY FOR CHOLESTEROL   fenofibrate (TRICOR) 145 MG tablet TAKE 1 TABLET DAILY FOR BLOOD FATS (Patient taking differently: Take 145 mg by mouth daily. Take 1 tablet daily for Blood Fats)   meloxicam (MOBIC) 15 MG tablet TAKE 1 TABLET DAILY WITH FOOD FOR PAIN AND INFLAMMATION   Multiple Vitamins-Minerals (ZINC PO) Take by mouth.   omeprazole (PRILOSEC) 40 MG capsule TAKE 1 CAPSULE TWICE A DAY FOR ACID INDIGESTION AND REFLUX   rosuvastatin (CRESTOR) 20 MG tablet TAKE 1 TABLET EVERY OTHER DAY ON EVEN DAYS OF THE MONTH (Patient taking differently: Take 1 tablet on Tuesday and Thursday)   valsartan (DIOVAN) 160 MG tablet TAKE 1 TABLET DAILY FOR BLOOD PRESSURE   gabapentin (NEURONTIN) 600 MG tablet Take  1/2 to 1 tablet  3 to 4 x /day  as needed for pain                                /                                                                   TAKE                                         BY                                                 MOUTH (Patient not taking: Reported on 02/05/2023)   tamsulosin (FLOMAX) 0.4 MG CAPS capsule Take  1 tablet   Daily  to help Pass Kidney Stones (Patient not taking: Reported on 02/05/2023)   No current facility-administered medications on file prior to visit.      Allergies:  Allergies  Allergen Reactions   Atorvastatin Other (See Comments)    Arthralgias   Percocet [Oxycodone-Acetaminophen] Other (See Comments)    Hallucinates     Medical History:  Past Medical History:  Diagnosis Date   Arthritis    Depression    GERD (gastroesophageal reflux disease)    Hyperlipidemia    Hypertension    Family history- Reviewed and unchanged Social history- Reviewed  and unchanged   Review of Systems:  A complete ROS was performed with pertinent positives/negatives noted in the HPI. The remainder of the ROS are negative.  Physical Exam: BP 138/80   Pulse 99   Temp 98.5 F (36.9 C)   Ht 5\' 7"  (1.702 m)   Wt 167 lb 6.4 oz (75.9 kg)   SpO2 99%   BMI 26.22 kg/m  Wt Readings from Last 3 Encounters:  02/05/23 167 lb 6.4 oz (75.9 kg)  10/31/22 166 lb 3.2 oz (75.4 kg)  03/27/22 170 lb (77.1 kg)   General Appearance: Well nourished, in no apparent distress. Eyes: PERRLA, EOMs, conjunctiva no swelling or erythema Sinuses: No Frontal/maxillary tenderness ENT/Mouth: Ext aud canals clear, TMs without erythema, bulging. No erythema, swelling, or exudate on post pharynx.  Tonsils not swollen or erythematous. Hearing normal.  Neck: Supple, thyroid normal.  Respiratory: Respiratory effort normal, BS equal bilaterally without rales, rhonchi, wheezing or stridor.  Cardio: RRR with no MRGs. Brisk peripheral pulses without edema.  Abdomen: Soft, + BS.  Non tender, no guarding, rebound, hernias, masses. Lymphatics: Non tender without lymphadenopathy.  Musculoskeletal: Full ROM, 5/5 strength, Normal gait Skin: Warm, dry without rashes, lesions, ecchymosis.  Neuro: Cranial nerves intact. No cerebellar symptoms.  Psych: Awake and oriented X 3, normal affect, Insight and Judgment appropriate.    Adela Glimpse, NP 4:59 PM [facility]

## 2023-02-05 NOTE — Patient Instructions (Signed)

## 2023-02-06 LAB — CBC WITH DIFFERENTIAL/PLATELET
Absolute Lymphocytes: 1278 {cells}/uL (ref 850–3900)
Absolute Monocytes: 732 {cells}/uL (ref 200–950)
Basophils Absolute: 48 {cells}/uL (ref 0–200)
Basophils Relative: 0.8 %
Eosinophils Absolute: 90 {cells}/uL (ref 15–500)
Eosinophils Relative: 1.5 %
HCT: 44.3 % (ref 38.5–50.0)
Hemoglobin: 15 g/dL (ref 13.2–17.1)
MCH: 32.3 pg (ref 27.0–33.0)
MCHC: 33.9 g/dL (ref 32.0–36.0)
MCV: 95.3 fL (ref 80.0–100.0)
MPV: 10.2 fL (ref 7.5–12.5)
Monocytes Relative: 12.2 %
Neutro Abs: 3852 {cells}/uL (ref 1500–7800)
Neutrophils Relative %: 64.2 %
Platelets: 268 10*3/uL (ref 140–400)
RBC: 4.65 10*6/uL (ref 4.20–5.80)
RDW: 12.5 % (ref 11.0–15.0)
Total Lymphocyte: 21.3 %
WBC: 6 10*3/uL (ref 3.8–10.8)

## 2023-02-06 LAB — LIPID PANEL
Cholesterol: 230 mg/dL — ABNORMAL HIGH (ref <200)
HDL: 95 mg/dL (ref >=40)
LDL Cholesterol (Calc): 111 mg/dL — ABNORMAL HIGH
Non-HDL Cholesterol (Calc): 135 mg/dL — ABNORMAL HIGH (ref <130)
Total CHOL/HDL Ratio: 2.4 (calc) (ref <5.0)
Triglycerides: 125 mg/dL (ref <150)

## 2023-02-06 LAB — COMPLETE METABOLIC PANEL WITH GFR
AG Ratio: 1.8 (calc) (ref 1.0–2.5)
ALT: 26 U/L (ref 9–46)
AST: 22 U/L (ref 10–35)
Albumin: 4.9 g/dL (ref 3.6–5.1)
Alkaline phosphatase (APISO): 72 U/L (ref 35–144)
BUN: 12 mg/dL (ref 7–25)
CO2: 28 mmol/L (ref 20–32)
Calcium: 10.7 mg/dL — ABNORMAL HIGH (ref 8.6–10.3)
Chloride: 99 mmol/L (ref 98–110)
Creat: 1.05 mg/dL (ref 0.70–1.35)
Globulin: 2.7 g/dL (ref 1.9–3.7)
Glucose, Bld: 85 mg/dL (ref 65–99)
Potassium: 4.4 mmol/L (ref 3.5–5.3)
Sodium: 137 mmol/L (ref 135–146)
Total Bilirubin: 0.5 mg/dL (ref 0.2–1.2)
Total Protein: 7.6 g/dL (ref 6.1–8.1)
eGFR: 81 mL/min/{1.73_m2} (ref >=60)

## 2023-02-06 LAB — HEMOGLOBIN A1C
Hgb A1c MFr Bld: 5.1 %{Hb} (ref <5.7)
Mean Plasma Glucose: 100 mg/dL
eAG (mmol/L): 5.5 mmol/L

## 2023-02-19 ENCOUNTER — Other Ambulatory Visit: Payer: Self-pay | Admitting: Nurse Practitioner

## 2023-03-15 ENCOUNTER — Other Ambulatory Visit: Payer: Self-pay | Admitting: Nurse Practitioner

## 2023-03-15 DIAGNOSIS — I10 Essential (primary) hypertension: Secondary | ICD-10-CM

## 2023-04-04 ENCOUNTER — Other Ambulatory Visit: Payer: Self-pay

## 2023-04-04 DIAGNOSIS — I10 Essential (primary) hypertension: Secondary | ICD-10-CM

## 2023-04-04 DIAGNOSIS — F32A Depression, unspecified: Secondary | ICD-10-CM

## 2023-04-04 MED ORDER — BUPROPION HCL ER (XL) 150 MG PO TB24: ORAL_TABLET | ORAL | 0 refills | Status: DC

## 2023-04-04 MED ORDER — ATENOLOL 100 MG PO TABS: ORAL_TABLET | ORAL | 0 refills | Status: DC

## 2023-04-18 ENCOUNTER — Other Ambulatory Visit: Payer: Self-pay

## 2023-04-18 DIAGNOSIS — F32A Depression, unspecified: Secondary | ICD-10-CM

## 2023-04-18 DIAGNOSIS — I10 Essential (primary) hypertension: Secondary | ICD-10-CM

## 2023-04-18 MED ORDER — ATENOLOL 100 MG PO TABS: ORAL_TABLET | ORAL | 0 refills | Status: AC

## 2023-04-18 MED ORDER — BUPROPION HCL ER (XL) 150 MG PO TB24: ORAL_TABLET | ORAL | 0 refills | Status: AC

## 2023-05-07 ENCOUNTER — Ambulatory Visit: Payer: BC Managed Care – PPO | Admitting: Internal Medicine

## 2023-07-06 ENCOUNTER — Ambulatory Visit

## 2023-07-06 ENCOUNTER — Encounter: Payer: Self-pay | Admitting: Emergency Medicine

## 2023-07-06 ENCOUNTER — Ambulatory Visit
Admission: EM | Admit: 2023-07-06 | Discharge: 2023-07-06 | Disposition: A | Discharge status: Home / Self Care | Attending: Family Medicine | Admitting: Family Medicine

## 2023-07-06 ENCOUNTER — Other Ambulatory Visit: Payer: Self-pay

## 2023-07-06 DIAGNOSIS — M25572 Pain in left ankle and joints of left foot: Secondary | ICD-10-CM | POA: Diagnosis not present

## 2023-07-06 DIAGNOSIS — M7732 Calcaneal spur, left foot: Secondary | ICD-10-CM | POA: Diagnosis not present

## 2023-07-06 DIAGNOSIS — M19072 Primary osteoarthritis, left ankle and foot: Secondary | ICD-10-CM | POA: Diagnosis not present

## 2023-07-06 DIAGNOSIS — S99912A Unspecified injury of left ankle, initial encounter: Secondary | ICD-10-CM | POA: Diagnosis not present

## 2023-07-06 MED ORDER — CELECOXIB 200 MG PO CAPS
200.0000 mg | ORAL_CAPSULE | Freq: Every day | ORAL | 0 refills | Status: AC
Start: 2023-07-06 — End: 2023-07-21

## 2023-07-06 NOTE — ED Triage Notes (Signed)
 Pt reports twisting his left ankle yesterday and he is having increase swollen and pain today.

## 2023-07-06 NOTE — Discharge Instructions (Addendum)
 Advised patient of left ankle x-ray results.  Advised patient to take medication as directed with food to completion.  Encouraged to increase daily water  intake to 64 ounces per day while taking this medication.  Advised patient to RICE affected area of left ankle for 30 minutes 3 times daily for the next 3 days.  Advised if symptoms worsen and/or unresolved please follow-up with your PCP or [facility] orthopedics for further evaluation.  Contact information provided with his AVS.

## 2023-07-06 NOTE — ED Provider Notes (Signed)
 [name]    CSN: [id number] Arrival date & time: 07/06/23  0959      History   Chief Complaint Chief Complaint  Patient presents with   Ankle Pain    HPI DIOVANNI Stewart is a 63 y.o. male.   HPI Pleasant 63 year old male presents with left ankle pain for 1 days secondary to twisting ankle. PMH significant for HTN, HLD, and GERD.  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   BMI 25.0-25.9,adult 10/12/2017   Other abnormal glucose 07/22/2013   Medication management 07/22/2013   Essential hypertension 03/12/2013   Vitamin D  deficiency    Hyperlipidemia     Past Surgical History:  Procedure Laterality Date   HERNIA REPAIR Bilateral    15 years ago   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   Procedure: TOTAL KNEE ARTHROPLASTY;  Surgeon: Ilean Mall, MD;  Location: [facility];  Service: Orthopedics;  Laterality: Left;   TOTAL KNEE ARTHROPLASTY Right 03/04/2014   Procedure: TOTAL KNEE ARTHROPLASTY;  Surgeon: Ilean Mall, MD;  Location: [facility] OR;  Service: Orthopedics;  Laterality: Right;   TOTAL KNEE ARTHROPLASTY Left 09/30/2018   Procedure: Revision Left Knee Arthroplasty;  Surgeon: Wendolyn Hamburger, MD;  Location: [facility];  Service: Orthopedics;  Laterality: Left;   TOTAL KNEE REVISION Right 07/07/2019   Procedure: RIGHT TOTAL KNEE REVISION;  Surgeon: Wendolyn Hamburger, MD;  Location: [facility];  Service: Orthopedics;  Laterality: Right;   TRANSFORAMINAL LUMBAR  INTERBODY FUSION (TLIF) WITH PEDICLE SCREW FIXATION 1 LEVEL Left 03/28/2018   Procedure: LEFT SIDED LUMBAR FOUR-FIVE TRANSFORAMINAL LUMBAR INTERBODY FUSION WITH INSTRUMENTATION AND ALLOGRAFT;  Surgeon: Virl Grimes, MD;  Location: [facility] OR;  Service: Orthopedics;  Laterality: Left;   VASECTOMY         Home Medications    Prior to Admission medications   Medication Sig Start Date End Date Taking? Authorizing Provider  celecoxib  (CELEBREX ) 200 MG capsule Take 1 capsule (200 mg total) by mouth daily for 15 days. 07/06/23 07/21/23 Yes Leonides Ramp, FNP  atenolol  (TENORMIN ) 100 MG tablet TAKE 1 TABLET AT BEDTIME FOR BLOOD PRESSURE 04/18/23   Webb, Padonda B, FNP  buPROPion  (WELLBUTRIN  XL) 150 MG 24 hr tablet TAKE 1 TABLET EVERY MORNING FOR MOOD, FOCUS AND CONCENTRATION 04/18/23   Webb, Padonda B, FNP  Cholecalciferol (VITAMIN D ) 125 MCG (5000 UT) CAPS Take by mouth. 15,000-20,000 units    [provider]  ezetimibe  (ZETIA ) 10 MG tablet TAKE 1 TABLET DAILY FOR CHOLESTEROL 02/19/23   Wilkinson, Dana E, FNP  fenofibrate  (TRICOR ) 145 MG tablet TAKE 1 TABLET DAILY FOR BLOOD FATS Patient taking differently: Take 145 mg by mouth daily. Take 1 tablet daily for Blood Fats 08/29/18   Vangie Genet, MD  gabapentin  (NEURONTIN ) 600 MG tablet Take  1/2 to 1 tablet  3 to 4 x /day  as needed for pain                                /  TAKE                                         BY                                                 MOUTH Patient not taking: Reported on 02/05/2023 08/14/22   Vangie Genet, MD  Multiple Vitamins-Minerals (ZINC  PO) Take by mouth.    [provider]  omeprazole  (PRILOSEC) 40 MG capsule TAKE 1 CAPSULE TWICE A DAY FOR ACID INDIGESTION AND REFLUX 02/19/23   Wilkinson, Dana E, FNP  rosuvastatin  (CRESTOR ) 20 MG tablet TAKE 1 TABLET EVERY OTHER DAY ON EVEN DAYS OF THE MONTH Patient taking differently: Take 1  tablet on Tuesday and Thursday 09/15/22   Cranford, Tonya, NP  tamsulosin  (FLOMAX ) 0.4 MG CAPS capsule Take  1 tablet   Daily  to help Pass Kidney Stones Patient not taking: Reported on 02/05/2023 12/11/22   Vangie Genet, MD  valsartan  (DIOVAN ) 160 MG tablet TAKE 1 TABLET DAILY FOR BLOOD PRESSURE 03/15/23   Vangie Genet, MD    Family History Family History  Problem Relation Age of Onset   Breast cancer Mother    Throat cancer Father    Hypertension Brother    Stroke Brother    Hypertension Brother    Hypertension Brother    Colon cancer Neg Hx    Stomach cancer Neg Hx    Rectal cancer Neg Hx     Social History Social History   Tobacco Use   Smoking status: Never   Smokeless tobacco: Former    Types: Engineer, drilling   Vaping status: Never Used  Substance Use Topics   Alcohol use: Yes    Alcohol/week: 14.0 standard drinks of alcohol    Types: 14 Cans of beer per week    Comment: 2-3 beer a day   Drug use: No     Allergies   Atorvastatin  and Percocet [oxycodone -acetaminophen ]   Review of Systems Review of Systems  Musculoskeletal:        Left foot pain     Physical Exam Triage Vital Signs ED Triage Vitals  Encounter Vitals Group     BP      Systolic BP Percentile      Diastolic BP Percentile      Pulse      Resp      Temp      Temp src      SpO2      Weight      Height      Head Circumference      Peak Flow      Pain Score      Pain Loc      Pain Education      Exclude from Growth Chart    No data found.  Updated Vital Signs BP (!) 140/80 (BP Location: Right Arm)   Pulse 75   Temp 98.6 F (37 C) (Oral)   Resp 18   SpO2 98%      Physical Exam Vitals and nursing note reviewed.  Constitutional:      Appearance: Normal appearance. He is normal weight.  HENT:     Head: Normocephalic and atraumatic.  Mouth/Throat:     Mouth: Mucous membranes are moist.     Pharynx: Oropharynx is clear.  Eyes:     Extraocular Movements:  Extraocular movements intact.     Conjunctiva/sclera: Conjunctivae normal.     Pupils: Pupils are equal, round, and reactive to light.  Cardiovascular:     Rate and Rhythm: Normal rate and regular rhythm.     Pulses: Normal pulses.     Heart sounds: Normal heart sounds.  Pulmonary:     Effort: Pulmonary effort is normal.     Breath sounds: Normal breath sounds. No wheezing, rhonchi or rales.  Musculoskeletal:        General: Normal range of motion.     Cervical back: Normal range of motion and neck supple.     Comments: Left ankle: TTP over lateral malleolus, full range of motion with flexion/extension, mild soft tissue swelling noted  Skin:    General: Skin is warm and dry.  Neurological:     General: No focal deficit present.     Mental Status: He is alert and oriented to person, place, and time. Mental status is at baseline.      UC Treatments / Results  Labs (all labs ordered are listed, but only abnormal results are displayed) Labs Reviewed - No data to display  EKG   Radiology DG Ankle Complete Left Result Date: 07/06/2023 CLINICAL DATA:  Left ankle pain secondary to left ankle injury yesterday EXAM: LEFT ANKLE COMPLETE - 3+ VIEW COMPARISON:  None Available. FINDINGS: No acute fracture or dislocation. No aggressive osseous lesion. Ankle mortise appears intact. Mild degenerative changes of the imaged joints. Osteophytes noted along the dorsal aspect of the midfoot region. Calcaneal spur noted along the Plantar aponeurosis attachment site. No focal soft tissue swelling. No radiopaque foreign bodies. IMPRESSION: No acute osseous abnormality of the left ankle joint. Electronically Signed   By: Beula Brunswick M.D.   On: 07/06/2023 10:52    Procedures Procedures (including critical care time)  Medications Ordered in UC Medications - No data to display  Initial Impression / Assessment and Plan / UC Course  I have reviewed the triage vital signs and the nursing  notes.  Pertinent labs & imaging results that were available during my care of the patient were reviewed by me and considered in my medical decision making (see chart for details).     MDM: 1.  Acute left ankle pain-left ankle x-ray results revealed above; 2.  Injury of left ankle, initial encounter-Ace wrap placed on left ankle prior to discharge, Rx'd Celebrex  200 mg capsule: Take 1 capsule daily x 15 days. Advised patient of left ankle x-ray results.  Advised patient to take medication as directed with food to completion.  Encouraged to increase daily water  intake to 64 ounces per day while taking this medication.  Advised patient to RICE affected area of left ankle for 30 minutes 3 times daily for the next 3 days.  Advised if symptoms worsen and/or unresolved please follow-up with your PCP or [facility] orthopedics for further evaluation.  Contact information provided with his AVS.  Final Clinical Impressions(s) / UC Diagnoses   Final diagnoses:  Acute left ankle pain  Injury of left ankle, initial encounter     Discharge Instructions      Advised patient of left ankle x-ray results.  Advised patient to take medication as directed with food to completion.  Encouraged to increase daily water  intake to 64 ounces per day while taking  this medication.  Advised patient to RICE affected area of left ankle for 30 minutes 3 times daily for the next 3 days.  Advised if symptoms worsen and/or unresolved please follow-up with your PCP or [facility] orthopedics for further evaluation.  Contact information provided with his AVS.   ED Prescriptions     Medication Sig Dispense Auth. Provider   celecoxib  (CELEBREX ) 200 MG capsule Take 1 capsule (200 mg total) by mouth daily for 15 days. 15 capsule Sabree Nuon, FNP      PDMP not reviewed this encounter.   Leonides Ramp, FNP 07/06/23 1112

## 2023-07-27 DIAGNOSIS — M79672 Pain in left foot: Secondary | ICD-10-CM | POA: Diagnosis not present

## 2023-07-27 DIAGNOSIS — M6702 Short Achilles tendon (acquired), left ankle: Secondary | ICD-10-CM | POA: Diagnosis not present

## 2023-07-27 DIAGNOSIS — M25572 Pain in left ankle and joints of left foot: Secondary | ICD-10-CM | POA: Diagnosis not present

## 2023-07-27 DIAGNOSIS — M14672 Charcot's joint, left ankle and foot: Secondary | ICD-10-CM | POA: Diagnosis not present

## 2023-07-27 DIAGNOSIS — G8929 Other chronic pain: Secondary | ICD-10-CM | POA: Diagnosis not present

## 2023-08-13 ENCOUNTER — Ambulatory Visit: Payer: BC Managed Care – PPO | Admitting: Nurse Practitioner

## 2023-08-14 ENCOUNTER — Ambulatory Visit: Admitting: Family Medicine

## 2023-10-30 DIAGNOSIS — M65311 Trigger thumb, right thumb: Secondary | ICD-10-CM | POA: Diagnosis not present

## 2023-11-12 ENCOUNTER — Encounter: Payer: BC Managed Care – PPO | Admitting: Internal Medicine

## 2023-11-19 ENCOUNTER — Encounter: Payer: BC Managed Care – PPO | Admitting: Internal Medicine

## 2023-11-27 DIAGNOSIS — M65311 Trigger thumb, right thumb: Secondary | ICD-10-CM | POA: Diagnosis not present
